# Patient Record
Sex: Male | Born: 1940 | Race: White | Hispanic: No | Marital: Married | Smoking: Former smoker
Health system: Southern US, Community
[De-identification: ages and names within clinical notes are randomized; demographics above are authoritative.]

## PROBLEM LIST (undated history)

## (undated) DIAGNOSIS — E785 Hyperlipidemia, unspecified: Secondary | ICD-10-CM

## (undated) DIAGNOSIS — I2510 Atherosclerotic heart disease of native coronary artery without angina pectoris: Secondary | ICD-10-CM

## (undated) DIAGNOSIS — K219 Gastro-esophageal reflux disease without esophagitis: Secondary | ICD-10-CM

## (undated) DIAGNOSIS — Z5189 Encounter for other specified aftercare: Secondary | ICD-10-CM

## (undated) DIAGNOSIS — I219 Acute myocardial infarction, unspecified: Secondary | ICD-10-CM

## (undated) DIAGNOSIS — F0390 Unspecified dementia without behavioral disturbance: Secondary | ICD-10-CM

## (undated) DIAGNOSIS — I10 Essential (primary) hypertension: Secondary | ICD-10-CM

## (undated) HISTORY — PX: CERVICAL SPINE SURGERY: SHX589

## (undated) HISTORY — PX: APPENDECTOMY: SHX54

## (undated) HISTORY — PX: CORONARY ARTERY BYPASS GRAFT: SHX141

## (undated) HISTORY — DX: Encounter for other specified aftercare: Z51.89

## (undated) HISTORY — DX: Gastro-esophageal reflux disease without esophagitis: K21.9

---

## 2000-08-27 ENCOUNTER — Encounter: Payer: Self-pay | Admitting: Cardiology

## 2000-08-27 ENCOUNTER — Inpatient Hospital Stay (HOSPITAL_COMMUNITY): Admission: EM | Admit: 2000-08-27 | Discharge: 2000-09-06 | Payer: Self-pay | Admitting: Emergency Medicine

## 2000-09-02 ENCOUNTER — Encounter: Payer: Self-pay | Admitting: Surgery

## 2000-09-03 ENCOUNTER — Encounter: Payer: Self-pay | Admitting: Surgery

## 2000-09-04 ENCOUNTER — Encounter: Payer: Self-pay | Admitting: Surgery

## 2004-01-20 ENCOUNTER — Inpatient Hospital Stay (HOSPITAL_COMMUNITY): Admission: EM | Admit: 2004-01-20 | Discharge: 2004-01-23 | Payer: Self-pay | Admitting: Internal Medicine

## 2013-01-14 ENCOUNTER — Inpatient Hospital Stay (HOSPITAL_COMMUNITY)
Admission: EM | Admit: 2013-01-14 | Discharge: 2013-01-17 | DRG: 300 | Disposition: A | Discharge status: Home / Self Care | Payer: Medicare Other | Attending: Internal Medicine | Admitting: Internal Medicine

## 2013-01-14 ENCOUNTER — Emergency Department (HOSPITAL_COMMUNITY): Payer: Medicare Other

## 2013-01-14 ENCOUNTER — Encounter (HOSPITAL_COMMUNITY): Payer: Self-pay

## 2013-01-14 DIAGNOSIS — I2510 Atherosclerotic heart disease of native coronary artery without angina pectoris: Secondary | ICD-10-CM

## 2013-01-14 DIAGNOSIS — R222 Localized swelling, mass and lump, trunk: Secondary | ICD-10-CM | POA: Diagnosis present

## 2013-01-14 DIAGNOSIS — D62 Acute posthemorrhagic anemia: Secondary | ICD-10-CM | POA: Diagnosis present

## 2013-01-14 DIAGNOSIS — Q2733 Arteriovenous malformation of digestive system vessel: Principal | ICD-10-CM

## 2013-01-14 DIAGNOSIS — Z87891 Personal history of nicotine dependence: Secondary | ICD-10-CM

## 2013-01-14 DIAGNOSIS — E785 Hyperlipidemia, unspecified: Secondary | ICD-10-CM | POA: Diagnosis present

## 2013-01-14 DIAGNOSIS — I209 Angina pectoris, unspecified: Secondary | ICD-10-CM

## 2013-01-14 DIAGNOSIS — R918 Other nonspecific abnormal finding of lung field: Secondary | ICD-10-CM | POA: Insufficient documentation

## 2013-01-14 DIAGNOSIS — R4182 Altered mental status, unspecified: Secondary | ICD-10-CM | POA: Diagnosis present

## 2013-01-14 DIAGNOSIS — K922 Gastrointestinal hemorrhage, unspecified: Secondary | ICD-10-CM

## 2013-01-14 DIAGNOSIS — I10 Essential (primary) hypertension: Secondary | ICD-10-CM | POA: Diagnosis present

## 2013-01-14 DIAGNOSIS — K2970 Gastritis, unspecified, without bleeding: Secondary | ICD-10-CM | POA: Diagnosis present

## 2013-01-14 DIAGNOSIS — D649 Anemia, unspecified: Secondary | ICD-10-CM

## 2013-01-14 DIAGNOSIS — K648 Other hemorrhoids: Secondary | ICD-10-CM | POA: Diagnosis present

## 2013-01-14 DIAGNOSIS — Z951 Presence of aortocoronary bypass graft: Secondary | ICD-10-CM

## 2013-01-14 DIAGNOSIS — I252 Old myocardial infarction: Secondary | ICD-10-CM

## 2013-01-14 DIAGNOSIS — D500 Iron deficiency anemia secondary to blood loss (chronic): Secondary | ICD-10-CM | POA: Diagnosis present

## 2013-01-14 DIAGNOSIS — K219 Gastro-esophageal reflux disease without esophagitis: Secondary | ICD-10-CM | POA: Diagnosis present

## 2013-01-14 DIAGNOSIS — R634 Abnormal weight loss: Secondary | ICD-10-CM | POA: Diagnosis present

## 2013-01-14 HISTORY — DX: Acute myocardial infarction, unspecified: I21.9

## 2013-01-14 HISTORY — DX: Atherosclerotic heart disease of native coronary artery without angina pectoris: I25.10

## 2013-01-14 HISTORY — DX: Essential (primary) hypertension: I10

## 2013-01-14 HISTORY — DX: Hyperlipidemia, unspecified: E78.5

## 2013-01-14 LAB — APTT: aPTT: 36 seconds (ref 24–37)

## 2013-01-14 LAB — PREPARE RBC (CROSSMATCH)

## 2013-01-14 LAB — CBC WITH DIFFERENTIAL/PLATELET
Eosinophils Absolute: 0.4 10*3/uL (ref 0.0–0.7)
Eosinophils Relative: 6 % — ABNORMAL HIGH (ref 0–5)
HCT: 19.2 % — ABNORMAL LOW (ref 39.0–52.0)
Lymphocytes Relative: 17 % (ref 12–46)
Lymphs Abs: 1.2 10*3/uL (ref 0.7–4.0)
MCH: 24 pg — ABNORMAL LOW (ref 26.0–34.0)
MCV: 79.3 fL (ref 78.0–100.0)
Monocytes Absolute: 0.7 10*3/uL (ref 0.1–1.0)
RBC: 2.42 MIL/uL — ABNORMAL LOW (ref 4.22–5.81)
RDW: 15.8 % — ABNORMAL HIGH (ref 11.5–15.5)
WBC: 7 10*3/uL (ref 4.0–10.5)

## 2013-01-14 LAB — TROPONIN I: Troponin I: <0.3 ng/mL (ref <0.30)

## 2013-01-14 LAB — URINALYSIS W MICROSCOPIC + REFLEX CULTURE
Glucose, UA: NEGATIVE mg/dL
Hgb urine dipstick: NEGATIVE
Ketones, ur: NEGATIVE mg/dL
Leukocytes, UA: NEGATIVE
Protein, ur: NEGATIVE mg/dL
pH: 5.5 (ref 5.0–8.0)

## 2013-01-14 LAB — BASIC METABOLIC PANEL
CO2: 25 mEq/L (ref 19–32)
Chloride: 102 mEq/L (ref 96–112)
Creatinine, Ser: 1.18 mg/dL (ref 0.50–1.35)
Glucose, Bld: 97 mg/dL (ref 70–99)

## 2013-01-14 MED ORDER — IOHEXOL 350 MG/ML SOLN
100.0000 mL | Freq: Once | INTRAVENOUS | Status: AC | PRN
  Administered 2013-01-14: 100 mL via INTRAVENOUS

## 2013-01-14 MED ORDER — ACETAMINOPHEN 650 MG RE SUPP: 650.0000 mg | Freq: Four times a day (QID) | RECTAL | Status: DC | PRN

## 2013-01-14 MED ORDER — PANTOPRAZOLE SODIUM 40 MG IV SOLR: 40.0000 mg | Freq: Two times a day (BID) | INTRAVENOUS | Status: DC

## 2013-01-14 MED ORDER — FUROSEMIDE 10 MG/ML IJ SOLN
20.0000 mg | Freq: Once | INTRAMUSCULAR | Status: AC
  Administered 2013-01-15: 20 mg via INTRAVENOUS
  Filled 2013-01-14: qty 2

## 2013-01-14 MED ORDER — ATORVASTATIN CALCIUM 20 MG PO TABS
20.0000 mg | ORAL_TABLET | Freq: Every evening | ORAL | Status: DC
  Administered 2013-01-15 – 2013-01-16 (×2): 20 mg via ORAL
  Filled 2013-01-14 (×2): qty 1

## 2013-01-14 MED ORDER — ASPIRIN 81 MG PO CHEW
324.0000 mg | CHEWABLE_TABLET | Freq: Once | ORAL | Status: AC
  Administered 2013-01-14: 324 mg via ORAL
  Filled 2013-01-14: qty 4

## 2013-01-14 MED ORDER — ONDANSETRON HCL 4 MG PO TABS: 4.0000 mg | ORAL_TABLET | Freq: Four times a day (QID) | ORAL | Status: DC | PRN

## 2013-01-14 MED ORDER — SODIUM CHLORIDE 0.9 % IV SOLN
INTRAVENOUS | Status: DC
  Administered 2013-01-15: 06:00:00 via INTRAVENOUS
  Administered 2013-01-16: 75 mL via INTRAVENOUS
  Administered 2013-01-16: 17:00:00 via INTRAVENOUS

## 2013-01-14 MED ORDER — SODIUM CHLORIDE 0.9 % IJ SOLN
3.0000 mL | Freq: Two times a day (BID) | INTRAMUSCULAR | Status: DC
  Administered 2013-01-14: 3 mL via INTRAVENOUS

## 2013-01-14 MED ORDER — ACETAMINOPHEN 325 MG PO TABS
650.0000 mg | ORAL_TABLET | Freq: Four times a day (QID) | ORAL | Status: DC | PRN
  Administered 2013-01-17: 650 mg via ORAL
  Filled 2013-01-14 (×2): qty 2

## 2013-01-14 MED ORDER — ONDANSETRON HCL 4 MG/2ML IJ SOLN: 4.0000 mg | Freq: Four times a day (QID) | INTRAMUSCULAR | Status: DC | PRN

## 2013-01-14 MED ORDER — PANTOPRAZOLE SODIUM 40 MG IV SOLR
40.0000 mg | Freq: Once | INTRAVENOUS | Status: AC
  Administered 2013-01-14: 40 mg via INTRAVENOUS
  Filled 2013-01-14: qty 40

## 2013-01-14 NOTE — ED Notes (Signed)
CRITICAL VALUE ALERT  Critical value received:  hgb 5.8  Date of notification:  01/14/13  Time of notification:  1839  Critical value read back: yes  Nurse who received alert:  Garald Braver  MD notified (1st page):  cook  Time of first page:  1831  MD notified (2nd page):  Time of second page:  Responding MD: cook  Time MD responded:

## 2013-01-14 NOTE — ED Provider Notes (Signed)
CSN: [id number]     Arrival date & time 01/14/13  1658 History     First MD Initiated Contact with Patient 01/14/13 1714     Chief Complaint  Patient presents with  . Chest Pain  . Shortness of Breath    HPI Pt was seen at 1725.  Per pt and family, c/o gradual onset and worsening of multiple intermittent episodes of chest "pain" for the past 1 to 2 weeks. Pt describes his symptoms as mid-sternal chest "pressure," associated with SOB, diaphoresis, and nausea. States the CP occasionally will radiate into his left shoulder. Symptoms occur with exertion (walking) and improve after resting (sitting) several minutes. Denies symptoms currently. Denies abd pain, no vomiting/diarrhea, no back pain, no cough, no palpitations, no syncope.    Past Medical History  Diagnosis Date  . MI (myocardial infarction)   . Hypertension   . Hyperlipidemia   . Coronary artery disease    Past Surgical History  Procedure Laterality Date  . Appendectomy    . Coronary artery bypass graft    . Cervical spine surgery      History  Substance Use Topics  . Smoking status: Former Games developer  . Smokeless tobacco: Not on file  . Alcohol Use: No    Review of Systems ROS: Statement: All systems negative except as marked or noted in the HPI; Constitutional: Negative for fever and chills. ; ; Eyes: Negative for eye pain, redness and discharge. ; ; ENMT: Negative for ear pain, hoarseness, nasal congestion, sinus pressure and sore throat. ; ; Cardiovascular: +CP, SOB, diaphoresis. Negative for palpitations, and peripheral edema. ; ; Respiratory: Negative for cough, wheezing and stridor. ; ; Gastrointestinal: +nausea. Negative for vomiting, diarrhea, abdominal pain, blood in stool, hematemesis, jaundice and rectal bleeding. . ; ; Genitourinary: Negative for dysuria, flank pain and hematuria. ; ; Musculoskeletal: Negative for back pain and neck pain. Negative for swelling and trauma.; ; Skin: Negative for pruritus, rash,  abrasions, blisters, bruising and skin lesion.; ; Neuro: Negative for headache, lightheadedness and neck stiffness. Negative for weakness, altered level of consciousness , altered mental status, extremity weakness, paresthesias, involuntary movement, seizure and syncope.     Allergies  Review of patient's allergies indicates no known allergies.  Home Medications   Current Outpatient Rx  Name  Route  Sig  Dispense  Refill  . atorvastatin (LIPITOR) 20 MG tablet   Oral   Take 20 mg by mouth every evening.         Marland Kitchen GARLIC PO   Oral   Take 1 tablet by mouth every evening.         . metoprolol tartrate (LOPRESSOR) 25 MG tablet   Oral   Take 25 mg by mouth every evening.         Marland Kitchen omeprazole (PRILOSEC) 20 MG capsule   Oral   Take 20 mg by mouth every evening.          BP 137/62  Pulse 72  Temp(Src) 98 F (36.7 C) (Oral)  Resp 21  Ht 5\' 7"  (1.702 m)  Wt 170 lb 5 oz (77.253 kg)  BMI 26.67 kg/m2  SpO2 98% Physical Exam 1730: Physical examination:  Nursing notes reviewed; Vital signs and O2 SAT reviewed;  Constitutional: Well developed, Well nourished, Well hydrated, In no acute distress; Head:  Normocephalic, atraumatic; Eyes: EOMI, PERRL, No scleral icterus; ENMT: Mouth and pharynx normal, Mucous membranes moist; Neck: Supple, Full range of motion, No lymphadenopathy; Cardiovascular: Regular rate and  rhythm, No gallop; Respiratory: Breath sounds clear & equal bilaterally, No wheezes.  Speaking full sentences with ease, Normal respiratory effort/excursion; Chest: Nontender, Movement normal; Abdomen: Soft, Nontender, Nondistended, Normal bowel sounds; Genitourinary: No CVA tenderness; Extremities: Pulses normal, No tenderness, No edema, No calf edema or asymmetry.; Neuro: AA&Ox3, Major CN grossly intact.  Speech clear. No gross focal motor or sensory deficits in extremities.; Skin: Color pale, Warm, Dry.   ED Course   1735:  Pt's symptoms concerning for angina. Pt states he  experienced symptoms "just walking in here" but they have currently resolved as he has rested on the ED stretcher. Will dose ASA now. EKG without acute ST elevations. Workup ordered.  1840:  CXR with possible mass. Hgb 5.8. Pt and family informed regarding same. Now reveals that he has had "black stools" for the past 1 to 2 weeks. Denies rectal bleeding, denies abd pain.  Abd continues benign on re-exam. Rectal exam performed w/permission of pt and ED RN chaperone present.  Anal tone normal.  Non-tender, soft brown stool in rectal vault, heme positive.  No fissures, no external hemorrhoids, no palp masses.  Will type and screen and check CT-A chest to r/o mass, PE.   1925:  T/C to [facility] Dr. Rito Ehrlich, case discussed, including:  HPI, pertinent PM/SHx, VS/PE, dx testing, ED course and treatment:  requests to obtain 2nd EKG and troponin and call him back regarding admission.  2100:  2nd troponin negative, EKG unchanged. Pt continues to deny CP/SOB while at rest. PRBC's transfusion ordered, started.  Dx and testing d/w pt and family.  Questions answered.  Verb understanding, agreeable to admit.  T/C to [facility] Dr. Rito Ehrlich, case discussed, including:  HPI, pertinent PM/SHx, VS/PE, dx testing, ED course and treatment:  Agreeable to come to ED for eval to admit at [facility] vs transfer to [facility].     Procedures    MDM  MDM Reviewed: previous chart, nursing note and vitals Reviewed previous: labs Interpretation: labs, ECG and x-ray Total time providing critical care: 30-74 minutes. This excludes time spent performing separately reportable procedures and services. Consults: admitting MD   CRITICAL CARE Performed by: Laray Anger Total critical care time: 35 Critical care time was exclusive of separately billable procedures and treating other patients. Critical care was necessary to treat or prevent imminent or life-threatening deterioration. Critical care was time spent personally by me on the following  activities: development of treatment plan with patient and/or surrogate as well as nursing, discussions with consultants, evaluation of patient's response to treatment, examination of patient, obtaining history from patient or surrogate, ordering and performing treatments and interventions, ordering and review of laboratory studies, ordering and review of radiographic studies, pulse oximetry and re-evaluation of patient's condition.    Date: 01/14/2013 1st EKG on arrival to ED  Rate: 75  Rhythm: normal sinus rhythm  QRS Axis: normal  Intervals: normal  ST/T Wave abnormalities: nonspecific ST/T changes  Conduction Disutrbances:none  Narrative Interpretation:   Old EKG Reviewed: none available.   Date: 01/14/2013 2nd EKG at Hospitalist request before admission  Rate: 74  Rhythm: normal sinus rhythm  QRS Axis: normal  Intervals: normal  ST/T Wave abnormalities: nonspecific ST/T changes  Conduction Disutrbances:none  Narrative Interpretation:   Old EKG Reviewed: unchanged from previous EKG obtained today.   Results for orders placed during the hospital encounter of 01/14/13  URINALYSIS W MICROSCOPIC + REFLEX CULTURE      Result Value Range   Color, Urine YELLOW  YELLOW  APPearance CLEAR  CLEAR   Specific Gravity, Urine 1.015  1.005 - 1.030   pH 5.5  5.0 - 8.0   Glucose, UA NEGATIVE  NEGATIVE mg/dL   Hgb urine dipstick NEGATIVE  NEGATIVE   Bilirubin Urine NEGATIVE  NEGATIVE   Ketones, ur NEGATIVE  NEGATIVE mg/dL   Protein, ur NEGATIVE  NEGATIVE mg/dL   Urobilinogen, UA 0.2  0.0 - 1.0 mg/dL   Nitrite NEGATIVE  NEGATIVE   Leukocytes, UA NEGATIVE  NEGATIVE  BASIC METABOLIC PANEL      Result Value Range   Sodium 138  135 - 145 mEq/L   Potassium 3.7  3.5 - 5.1 mEq/L   Chloride 102  96 - 112 mEq/L   CO2 25  19 - 32 mEq/L   Glucose, Bld 97  70 - 99 mg/dL   BUN 10  6 - 23 mg/dL   Creatinine, Ser 4.09  0.50 - 1.35 mg/dL   Calcium 9.3  8.4 - 81.1 mg/dL   GFR calc non Af Amer 59 (*)  >90 mL/min   GFR calc Af Amer 69 (*) >90 mL/min  CBC WITH DIFFERENTIAL      Result Value Range   WBC 7.0  4.0 - 10.5 K/uL   RBC 2.42 (*) 4.22 - 5.81 MIL/uL   Hemoglobin 5.8 (*) 13.0 - 17.0 g/dL   HCT 91.4 (*) 78.2 - 95.6 %   MCV 79.3  78.0 - 100.0 fL   MCH 24.0 (*) 26.0 - 34.0 pg   MCHC 30.2  30.0 - 36.0 g/dL   RDW 21.3 (*) 08.6 - 57.8 %   Platelets 307  150 - 400 K/uL   Neutrophils Relative % 66  43 - 77 %   Neutro Abs 4.6  1.7 - 7.7 K/uL   Lymphocytes Relative 17  12 - 46 %   Lymphs Abs 1.2  0.7 - 4.0 K/uL   Monocytes Relative 10  3 - 12 %   Monocytes Absolute 0.7  0.1 - 1.0 K/uL   Eosinophils Relative 6 (*) 0 - 5 %   Eosinophils Absolute 0.4  0.0 - 0.7 K/uL   Basophils Relative 1  0 - 1 %   Basophils Absolute 0.1  0.0 - 0.1 K/uL  TROPONIN I      Result Value Range   Troponin I <0.30  <0.30 ng/mL  PRO B NATRIURETIC PEPTIDE      Result Value Range   Pro B Natriuretic peptide (BNP) 634.6 (*) 0 - 125 pg/mL  TROPONIN I      Result Value Range   Troponin I <0.30  <0.30 ng/mL  TYPE AND SCREEN      Result Value Range   ABO/RH(D) O POS     Antibody Screen NEG     Sample Expiration 01/17/2013     Unit Number [id number]     Blood Component Type RED CELLS,LR     Unit division 00     Status of Unit ALLOCATED     Transfusion Status OK TO TRANSFUSE     Crossmatch Result Compatible     Unit Number [id number]     Blood Component Type RED CELLS,LR     Unit division 00     Status of Unit ALLOCATED     Transfusion Status OK TO TRANSFUSE     Crossmatch Result Compatible    PREPARE RBC (CROSSMATCH)      Result Value Range   Order Confirmation ORDER PROCESSED BY BLOOD BANK  ABO/RH      Result Value Range   ABO/RH(D) O POS     Dg Chest Port 1 View 01/14/2013   *RADIOLOGY REPORT*  Clinical Data: 72 year old male with chest pain, bilateral lower extremity pain.  PORTABLE CHEST - 1 VIEW  Comparison: [facility] chest radiograph 01/20/2004.  Findings: Portable AP  upright view of the chest at 1741 hours. New contour abnormality in the right paratracheal station with a 47 mm area of mass-like opacity inseparable from the superior right mediastinal contour. More subtle rounded increased right hilar contour abnormality compared to the prior study.  Elsewhere mediastinal contours are stable.  Stable sequelae of CABG.  Partially visible ACDF hardware.  No pneumothorax or pulmonary edema.  No pleural effusion or other confluent pulmonary opacity.  IMPRESSION: New mass-like opacity compatible with right suprahilar / paratracheal soft tissue mass.  Top differential consideration is bronchogenic carcinoma.  Recommend follow-up chest CT (IV contrast preferred).   Original Report Authenticated By: Erskine Speed, M.D.   Ct Angio Chest Pe W/cm &/or Wo Cm 01/14/2013   *RADIOLOGY REPORT*  Clinical Data: Abnormal chest x-ray  CT ANGIOGRAPHY CHEST  Technique:  Multidetector CT imaging of the chest using the standard protocol during bolus administration of intravenous contrast. Multiplanar reconstructed images including MIPs were obtained and reviewed to evaluate the vascular anatomy.  Contrast: OMNIPAQUE IOHEXOL 350 MG/ML SOLN  Comparison: Chest x-ray same day  Findings: Sagittal images of the spine shows degenerative changes thoracic spine.  The patient is status post CABG.  Atherosclerotic calcifications of the thoracic aorta and coronary arteries.  Heart size is within normal limits.  As noted on chest x-ray there is irregular  elongated mass arising from right suprahilar region just above the right pulmonary artery extending in the right upper lobe.  The lesion is best visualized in the coronal image 69 measures about 6.5 cm cranial caudally by 3.3 cm.  This is highly suspicious for a primary bronchogenic carcinoma.  There is encasement and some narrowing of a subsegmental arterial branch in the right upper lobe in axial image 28. Best visualized in the coronal image 70.  Further  evaluation with bronchoscopy/biopsy is recommended.  No significant mediastinal adenopathy.  No left hilar adenopathy.  No adrenal gland mass is noted in visualized upper abdomen.  No definite hepatic lesions are noted in visualized liver.  A precarinal lymph node measures 10 x 9 mm.  No acute infiltrate or pulmonary edema.  No pleural effusion.  IMPRESSION:  1.  There is a elongated irregular mass arising from the right suprahilar region extending in the right upper lobe with mild encasement of the subsegmental pulmonary artery arterial branch. The lesion measures at least 6.5 cm cranial caudally by 3.3 cm. This is highly suspicious for primary bronchogenic carcinoma. Further evaluation with bronchoscopy/biopsy is recommended. 2.  No significant mediastinal or left hilar adenopathy. 3.  No acute infiltrate or pulmonary edema.  No pleural effusion. 4.  Degenerative changes thoracic spine.   Original Report Authenticated By: Natasha Mead, M.D.              Laray Anger, DO 01/16/13 2200

## 2013-01-14 NOTE — ED Notes (Signed)
Pt reports cp that radiates to his left shoulder, dizziness, and sob for the past few weeks.  Pt reports " i got really tired and sob just walking into this building".

## 2013-01-14 NOTE — H&P (Signed)
[facility] History and Physical  Jonathan Watson UJW:[id number] DOB: 11/06/1940 DOA: 01/14/2013   PCP: Colon Branch, MD  Specialists: He used to be seen by a cardiologist in the past. However, he doesn't recall the name.  Chief Complaint: Weakness, dizziness  HPI: Jonathan Watson is a 72 y.o. male with a past medical history of coronary artery disease, hyperlipidemia, GERD, who appears have some kind of a cognitive impairment. His wife states that he has been having a lot of memory issues. She thinks that he may have early dementia. So history was limited from the patient. However, it appears, that the patient has been having dizziness and weakness for the last few days. He's been having shortness of breath with exertion, and had some chest pain earlier today. Denies any headaches. He had noticed black stools about a week ago. He had a few episodes of that according to the wife. He denies any abdominal pain currently. Denies any nausea, vomiting. No emesis of blood. No frank blood per rectum. No cough. No syncopal episodes. He does have a history of acid reflux and takes Prilosec. Denies taking any pain medicine, per se. Because of the dizziness, and tiredness they decided to come in to the hospital. Patient denies any chest pain currently. He denies any other discomfort at this time. He's never had endoscopy in the past. He also admits to weight loss of about 4 pounds in the last few weeks and about 20 pounds over the last one year. He had blood work by his primary care physician a few weeks ago, and everything according to the patient was normal. History is limited.  Home Medications: Prior to Admission medications   Medication Sig Start Date End Date Taking? Authorizing Provider  atorvastatin (LIPITOR) 20 MG tablet Take 20 mg by mouth every evening.   Yes Historical Provider, MD  GARLIC PO Take 1 tablet by mouth every evening.   Yes Historical Provider, MD  metoprolol tartrate  (LOPRESSOR) 25 MG tablet Take 25 mg by mouth every evening.   Yes Historical Provider, MD  omeprazole (PRILOSEC) 20 MG capsule Take 20 mg by mouth every evening.   Yes Historical Provider, MD    Allergies: No Known Allergies  Past Medical History: Past Medical History  Diagnosis Date  . MI (myocardial infarction)   . Hypertension   . Hyperlipidemia   . Coronary artery disease     Past Surgical History  Procedure Laterality Date  . Appendectomy    . Coronary artery bypass graft    . Cervical spine surgery      Social History:  reports that he has quit smoking. He does not have any smokeless tobacco history on file. He reports that he does not drink alcohol or use illicit drugs.  Living Situation: Lives in [place] with his wife Activity Level: Usually independent with daily activities   Family History: Patient denies any medical problems in his family  Review of Systems - History obtained from the patient General ROS: positive for  - fatigue Psychological ROS: negative Ophthalmic ROS: negative ENT ROS: negative Allergy and Immunology ROS: negative Hematological and Lymphatic ROS: negative Endocrine ROS: negative Respiratory ROS: as in hpi Cardiovascular ROS: as in hpi Gastrointestinal ROS: as in hpi Genito-Urinary ROS: no dysuria, trouble voiding, or hematuria Musculoskeletal ROS: negative Neurological ROS: no TIA or stroke symptoms Dermatological ROS: negative  Physical Examination  Filed Vitals:   01/14/13 1721 01/14/13 1800 01/14/13 1827 01/14/13 1900  BP: 128/71 145/61 145/61  137/62  Pulse: 74  65 72  Temp:      TempSrc:   Oral   Resp: 12 14 18 21   Height:      Weight:      SpO2:  98%      General appearance: alert, cooperative, appears stated age, distracted and no distress Head: Normocephalic, without obvious abnormality, atraumatic Eyes: pallor noted. No icterus. EOMI. Neck: no adenopathy, no carotid bruit, no JVD, supple, symmetrical, trachea  midline and thyroid not enlarged, symmetric, no tenderness/mass/nodules Back: symmetric, no curvature. ROM normal. No CVA tenderness. Resp: clear to auscultation bilaterally Cardio: regular rate and rhythm, S1, S2 normal, no murmur, click, rub or gallop GI: soft, non-tender; bowel sounds normal; no masses,  no organomegaly Extremities: extremities normal, atraumatic, no cyanosis or edema Pulses: 2+ and symmetric Skin: Skin color, texture, turgor normal. No rashes or lesions Lymph nodes: Cervical, supraclavicular, and axillary nodes normal. Neurologic: Alert, normal strength and tone. Normal symmetric reflexes. Normal coordination.  Laboratory Data: Results for orders placed during the hospital encounter of 01/14/13 (from the past 48 hour(s))  BASIC METABOLIC PANEL     Status: Abnormal   Collection Time    01/14/13  5:50 PM      Result Value Range   Sodium 138  135 - 145 mEq/L   Potassium 3.7  3.5 - 5.1 mEq/L   Chloride 102  96 - 112 mEq/L   CO2 25  19 - 32 mEq/L   Glucose, Bld 97  70 - 99 mg/dL   BUN 10  6 - 23 mg/dL   Creatinine, Ser 1.61  0.50 - 1.35 mg/dL   Calcium 9.3  8.4 - 09.6 mg/dL   GFR calc non Af Amer 59 (*) >90 mL/min   GFR calc Af Amer 69 (*) >90 mL/min   Comment: (NOTE)     The eGFR has been calculated using the CKD EPI equation.     This calculation has not been validated in all clinical situations.     eGFR's persistently <90 mL/min signify possible Chronic Kidney     Disease.  CBC WITH DIFFERENTIAL     Status: Abnormal   Collection Time    01/14/13  5:50 PM      Result Value Range   WBC 7.0  4.0 - 10.5 K/uL   RBC 2.42 (*) 4.22 - 5.81 MIL/uL   Hemoglobin 5.8 (*) 13.0 - 17.0 g/dL   Comment: RESULT REPEATED AND VERIFIED     CRITICAL RESULT CALLED TO, READ BACK BY AND VERIFIED WITH:      YOUNG,J @ 1827 ON 01/14/13 BY WOODIE,J   HCT 19.2 (*) 39.0 - 52.0 %   MCV 79.3  78.0 - 100.0 fL   MCH 24.0 (*) 26.0 - 34.0 pg   MCHC 30.2  30.0 - 36.0 g/dL   RDW 04.5 (*)  40.9 - 15.5 %   Platelets 307  150 - 400 K/uL   Neutrophils Relative % 66  43 - 77 %   Neutro Abs 4.6  1.7 - 7.7 K/uL   Lymphocytes Relative 17  12 - 46 %   Lymphs Abs 1.2  0.7 - 4.0 K/uL   Monocytes Relative 10  3 - 12 %   Monocytes Absolute 0.7  0.1 - 1.0 K/uL   Eosinophils Relative 6 (*) 0 - 5 %   Eosinophils Absolute 0.4  0.0 - 0.7 K/uL   Basophils Relative 1  0 - 1 %   Basophils Absolute 0.1  0.0 - 0.1 K/uL  TROPONIN I     Status: None   Collection Time    01/14/13  5:50 PM      Result Value Range   Troponin I <0.30  <0.30 ng/mL   Comment:            Due to the release kinetics of cTnI,     a negative result within the first hours     of the onset of symptoms does not rule out     myocardial infarction with certainty.     If myocardial infarction is still suspected,     repeat the test at appropriate intervals.  PRO B NATRIURETIC PEPTIDE     Status: Abnormal   Collection Time    01/14/13  5:50 PM      Result Value Range   Pro B Natriuretic peptide (BNP) 634.6 (*) 0 - 125 pg/mL  PREPARE RBC (CROSSMATCH)     Status: None   Collection Time    01/14/13  5:50 PM      Result Value Range   Order Confirmation ORDER PROCESSED BY BLOOD BANK    TYPE AND SCREEN     Status: None   Collection Time    01/14/13  6:54 PM      Result Value Range   ABO/RH(D) O POS     Antibody Screen NEG     Sample Expiration 01/17/2013     Unit Number [id number]     Blood Component Type RED CELLS,LR     Unit division 00     Status of Unit ALLOCATED     Transfusion Status OK TO TRANSFUSE     Crossmatch Result Compatible     Unit Number [id number]     Blood Component Type RED CELLS,LR     Unit division 00     Status of Unit ALLOCATED     Transfusion Status OK TO TRANSFUSE     Crossmatch Result Compatible    ABO/RH     Status: None   Collection Time    01/14/13  6:54 PM      Result Value Range   ABO/RH(D) O POS    URINALYSIS W MICROSCOPIC + REFLEX CULTURE     Status: None   Collection  Time    01/14/13  7:38 PM      Result Value Range   Color, Urine YELLOW  YELLOW   APPearance CLEAR  CLEAR   Specific Gravity, Urine 1.015  1.005 - 1.030   pH 5.5  5.0 - 8.0   Glucose, UA NEGATIVE  NEGATIVE mg/dL   Hgb urine dipstick NEGATIVE  NEGATIVE   Bilirubin Urine NEGATIVE  NEGATIVE   Ketones, ur NEGATIVE  NEGATIVE mg/dL   Protein, ur NEGATIVE  NEGATIVE mg/dL   Urobilinogen, UA 0.2  0.0 - 1.0 mg/dL   Nitrite NEGATIVE  NEGATIVE   Leukocytes, UA NEGATIVE  NEGATIVE  TROPONIN I     Status: None   Collection Time    01/14/13  8:07 PM      Result Value Range   Troponin I <0.30  <0.30 ng/mL   Comment:            Due to the release kinetics of cTnI,     a negative result within the first hours     of the onset of symptoms does not rule out     myocardial infarction with certainty.     If myocardial infarction is still suspected,  repeat the test at appropriate intervals.    Radiology Reports: Ct Angio Chest Pe W/cm &/or Wo Cm  01/14/2013   *RADIOLOGY REPORT*  Clinical Data: Abnormal chest x-ray  CT ANGIOGRAPHY CHEST  Technique:  Multidetector CT imaging of the chest using the standard protocol during bolus administration of intravenous contrast. Multiplanar reconstructed images including MIPs were obtained and reviewed to evaluate the vascular anatomy.  Contrast: OMNIPAQUE IOHEXOL 350 MG/ML SOLN  Comparison: Chest x-ray same day  Findings: Sagittal images of the spine shows degenerative changes thoracic spine.  The patient is status post CABG.  Atherosclerotic calcifications of the thoracic aorta and coronary arteries.  Heart size is within normal limits.  As noted on chest x-ray there is irregular  elongated mass arising from right suprahilar region just above the right pulmonary artery extending in the right upper lobe.  The lesion is best visualized in the coronal image 69 measures about 6.5 cm cranial caudally by 3.3 cm.  This is highly suspicious for a primary bronchogenic  carcinoma.  There is encasement and some narrowing of a subsegmental arterial branch in the right upper lobe in axial image 28. Best visualized in the coronal image 70.  Further evaluation with bronchoscopy/biopsy is recommended.  No significant mediastinal adenopathy.  No left hilar adenopathy.  No adrenal gland mass is noted in visualized upper abdomen.  No definite hepatic lesions are noted in visualized liver.  A precarinal lymph node measures 10 x 9 mm.  No acute infiltrate or pulmonary edema.  No pleural effusion.  IMPRESSION:  1.  There is a elongated irregular mass arising from the right suprahilar region extending in the right upper lobe with mild encasement of the subsegmental pulmonary artery arterial branch. The lesion measures at least 6.5 cm cranial caudally by 3.3 cm. This is highly suspicious for primary bronchogenic carcinoma. Further evaluation with bronchoscopy/biopsy is recommended. 2.  No significant mediastinal or left hilar adenopathy. 3.  No acute infiltrate or pulmonary edema.  No pleural effusion. 4.  Degenerative changes thoracic spine.   Original Report Authenticated By: Natasha Mead, M.D.   Dg Chest Port 1 View  01/14/2013   *RADIOLOGY REPORT*  Clinical Data: 72 year old male with chest pain, bilateral lower extremity pain.  PORTABLE CHEST - 1 VIEW  Comparison: [facility] chest radiograph 01/20/2004.  Findings: Portable AP upright view of the chest at 1741 hours. New contour abnormality in the right paratracheal station with a 47 mm area of mass-like opacity inseparable from the superior right mediastinal contour. More subtle rounded increased right hilar contour abnormality compared to the prior study.  Elsewhere mediastinal contours are stable.  Stable sequelae of CABG.  Partially visible ACDF hardware.  No pneumothorax or pulmonary edema.  No pleural effusion or other confluent pulmonary opacity.  IMPRESSION: New mass-like opacity compatible with right suprahilar /  paratracheal soft tissue mass.  Top differential consideration is bronchogenic carcinoma.  Recommend follow-up chest CT (IV contrast preferred).   Original Report Authenticated By: Erskine Speed, M.D.    Electrocardiogram: Initial EKG shows sinus rhythm at 75 beats per minute. Normal axis. Intervals are normal. Subtle ST depression noted in the lateral leads, as well as in lead 2. Subsequent EKG shows less pronounced ST depression.  Problem List  Principal Problem:   Upper GI bleed Active Problems:   Anemia   Lung mass   CAD (coronary artery disease)   GERD (gastroesophageal reflux disease)   Hyperlipidemia   Assessment: This is a 72 year old,  Caucasian male, who presents with dizziness, weakness, and is found to have severe anemia. He had heme positive brown stool, but reports black stools one week ago. He's also noted to have a right lung mass. His chest pain, and shortness of breath is probably related to his anemia.  Plan: #1 severe anemia: This is most likely causing his dyspnea and his angina symptoms. He'll be transfused 2 units to begin with. Posttransfusion CBC will be obtained. He'll be transfused to keep his hemoglobin greater than 8 at the very least.  #2 possible upper GI bleeding with history of melena: Give proton pump inhibitors. Will be kept n.p.o. GI will be consulted as he may need endoscopy in the morning. PT, INR will be checked, although he's not on any anticoagulants.  #3 right lung mass: This will need to be evaluated once the above acute issues have been stabilized. We will consult pulmonology. He will need bronchoscopy and tissue diagnosis.  #4 history of coronary artery disease, status post CABG: He had abnormal EKG. Troponins are reassuringly normal. This was most likely due to the anemia. We will transfuse him blood. Repeat EKG in the morning and continue to trend Troponin. Due to possibility of GI bleeding hold further antiplatelet agents. Monitor him closely on  telemetry. Cardiology input will be obtained if patient continues to have anginal symptoms.  DVT Prophylaxis: SCDs Code Status: Full code Family Communication: Discussed with the patient and his wife  Disposition Plan: Admit to step down   Further management decisions will depend on results of further testing and patient's response to treatment.  [facility]  [facility] Pager [phone]  If 7PM-7AM, please contact night-coverage [email or web address] Password [facility]  01/14/2013, 10:17 PM

## 2013-01-15 ENCOUNTER — Inpatient Hospital Stay (HOSPITAL_COMMUNITY): Payer: Medicare Other

## 2013-01-15 ENCOUNTER — Encounter (HOSPITAL_COMMUNITY): Payer: Self-pay | Admitting: *Deleted

## 2013-01-15 ENCOUNTER — Encounter (HOSPITAL_COMMUNITY)
Admission: EM | Disposition: A | Discharge status: Home / Self Care | Payer: Self-pay | Source: Home / Self Care | Attending: Internal Medicine

## 2013-01-15 DIAGNOSIS — K2990 Gastroduodenitis, unspecified, without bleeding: Secondary | ICD-10-CM

## 2013-01-15 DIAGNOSIS — K2970 Gastritis, unspecified, without bleeding: Secondary | ICD-10-CM

## 2013-01-15 DIAGNOSIS — R634 Abnormal weight loss: Secondary | ICD-10-CM

## 2013-01-15 DIAGNOSIS — K921 Melena: Secondary | ICD-10-CM

## 2013-01-15 DIAGNOSIS — K31819 Angiodysplasia of stomach and duodenum without bleeding: Secondary | ICD-10-CM

## 2013-01-15 HISTORY — PX: ESOPHAGOGASTRODUODENOSCOPY: SHX5428

## 2013-01-15 LAB — COMPREHENSIVE METABOLIC PANEL
ALT: 21 U/L (ref 0–53)
AST: 16 U/L (ref 0–37)
Albumin: 3.3 g/dL — ABNORMAL LOW (ref 3.5–5.2)
Alkaline Phosphatase: 86 U/L (ref 39–117)
BUN: 10 mg/dL (ref 6–23)
Chloride: 102 mEq/L (ref 96–112)
Potassium: 4 mEq/L (ref 3.5–5.1)
Sodium: 137 mEq/L (ref 135–145)
Total Bilirubin: 0.4 mg/dL (ref 0.3–1.2)
Total Protein: 6.9 g/dL (ref 6.0–8.3)

## 2013-01-15 LAB — CBC
MCH: 25.8 pg — ABNORMAL LOW (ref 26.0–34.0)
Platelets: 343 10*3/uL (ref 150–400)
RBC: 3.45 MIL/uL — ABNORMAL LOW (ref 4.22–5.81)

## 2013-01-15 LAB — MRSA PCR SCREENING: MRSA by PCR: NEGATIVE

## 2013-01-15 LAB — TROPONIN I: Troponin I: <0.3 ng/mL (ref <0.30)

## 2013-01-15 SURGERY — EGD (ESOPHAGOGASTRODUODENOSCOPY)
Anesthesia: Moderate Sedation

## 2013-01-15 MED ORDER — MEPERIDINE HCL 100 MG/ML IJ SOLN
INTRAMUSCULAR | Status: AC
  Filled 2013-01-15: qty 2

## 2013-01-15 MED ORDER — SODIUM CHLORIDE 0.9 % IV SOLN: INTRAVENOUS | Status: DC

## 2013-01-15 MED ORDER — MEPERIDINE HCL 100 MG/ML IJ SOLN
INTRAMUSCULAR | Status: DC | PRN
  Administered 2013-01-15 (×2): 25 mg via INTRAVENOUS

## 2013-01-15 MED ORDER — PEG 3350-KCL-NABCB-NACL-NASULF 236 G PO SOLR
4000.0000 mL | Freq: Once | ORAL | Status: AC
  Administered 2013-01-15: 4000 mL via ORAL
  Filled 2013-01-15: qty 4000

## 2013-01-15 MED ORDER — MIDAZOLAM HCL 5 MG/5ML IJ SOLN
INTRAMUSCULAR | Status: AC
  Filled 2013-01-15: qty 10

## 2013-01-15 MED ORDER — PANTOPRAZOLE SODIUM 40 MG PO TBEC
40.0000 mg | DELAYED_RELEASE_TABLET | Freq: Two times a day (BID) | ORAL | Status: DC
  Administered 2013-01-15 – 2013-01-16 (×3): 40 mg via ORAL
  Filled 2013-01-15 (×3): qty 1

## 2013-01-15 MED ORDER — STERILE WATER FOR IRRIGATION IR SOLN
Status: DC | PRN
  Administered 2013-01-15: 10:00:00

## 2013-01-15 MED ORDER — MIDAZOLAM HCL 5 MG/5ML IJ SOLN
INTRAMUSCULAR | Status: DC | PRN
  Administered 2013-01-15 (×3): 1 mg via INTRAVENOUS

## 2013-01-15 MED ORDER — BUTAMBEN-TETRACAINE-BENZOCAINE 2-2-14 % EX AERO
INHALATION_SPRAY | CUTANEOUS | Status: DC | PRN
  Administered 2013-01-15: 2 via TOPICAL

## 2013-01-15 MED ORDER — BISACODYL 5 MG PO TBEC
10.0000 mg | DELAYED_RELEASE_TABLET | ORAL | Status: AC
  Administered 2013-01-15 (×2): 10 mg via ORAL
  Filled 2013-01-15 (×2): qty 2

## 2013-01-15 NOTE — Progress Notes (Signed)
Gave bedside report to Zannie Kehr, RN. Patient agitated when getting back into bed. Still oriented to person and place. ICU monitoring of patient.

## 2013-01-15 NOTE — Consult Note (Signed)
Consult requested by: Dr. Karilyn Cota Consult requested for abnormal chest CT:  HPI: This is a 72 year old who came to the hospital because of bleeding. He seems somewhat confused to me this morning. As part of his evaluation he ended up having a CT scan which shows a mass lesion in his right lung. He has an extensive smoking history but stopped some years ago by his history and has had weight loss and is complaining of some right-sided chest pain. He has not had any hemoptysis.  Past Medical History  Diagnosis Date  . MI (myocardial infarction)   . Hypertension   . Hyperlipidemia   . Coronary artery disease      History reviewed. No pertinent family history.   History   Social History  . Marital Status: Married    Spouse Name: N/A    Number of Children: N/A  . Years of Education: N/A   Social History Main Topics  . Smoking status: Former Games developer  . Smokeless tobacco: None  . Alcohol Use: No  . Drug Use: No  . Sexual Activity: None   Other Topics Concern  . None   Social History Narrative  . None     ROS: Because of his confusion and not sure how accurate this is but he says he lost about 5 pounds this week. He has no other new complaints. He says he is breathing he is in trouble and he has episodes of "smothering".    Objective: Vital signs in last 24 hours: Temp:  [98 F (36.7 C)-98.7 F (37.1 C)] 98.5 F (36.9 C) (08/16 0540) Pulse Rate:  [64-85] 85 (08/16 0400) Resp:  [8-22] 10 (08/16 0600) BP: (110-162)/(39-96) 135/59 mmHg (08/16 0600) SpO2:  [98 %-99 %] 99 % (08/16 0400) Weight:  [75.3 kg (166 lb 0.1 oz)-77.253 kg (170 lb 5 oz)] 76.6 kg (168 lb 14 oz) (08/16 0400) Weight change:  Last BM Date: 01/15/13  Intake/Output from previous day: 08/15 0701 - 08/16 0700 In: 375 [Blood:375] Out: 1700 [Urine:1700]  PHYSICAL EXAM He is awake and alert and confused. His pupils are reactive nose and throat are clear mucous membranes are dry his neck is supple without  masses. I do not feel any lymphadenopathy in his neck or the supraclavicular area his chest is relatively clear with some rhonchi. His heart is regular without gallop. His abdomen is soft without masses. Extremities showed no edema. Central nervous system exam is grossly intact except for his confusion  Lab Results: Basic Metabolic Panel:  Recent Labs  16/10/96 1750 01/15/13 0217  NA 138 137  K 3.7 4.0  CL 102 102  CO2 25 25  GLUCOSE 97 104*  BUN 10 10  CREATININE 1.18 1.18  CALCIUM 9.3 9.4   Liver Function Tests:  Recent Labs  01/15/13 0217  AST 16  ALT 21  ALKPHOS 86  BILITOT 0.4  PROT 6.9  ALBUMIN 3.3*   No results found for this basename: LIPASE, AMYLASE,  in the last 72 hours No results found for this basename: AMMONIA,  in the last 72 hours CBC:  Recent Labs  01/14/13 1750 01/15/13 0704  WBC 7.0 8.0  NEUTROABS 4.6  --   HGB 5.8* 8.9*  HCT 19.2* 28.0*  MCV 79.3 81.2  PLT 307 343   Cardiac Enzymes:  Recent Labs  01/14/13 2007 01/15/13 0217 01/15/13 0704  TROPONINI <0.30 <0.30 <0.30   BNP:  Recent Labs  01/14/13 1750  PROBNP 634.6*   D-Dimer: No results  found for this basename: DDIMER,  in the last 72 hours CBG: No results found for this basename: GLUCAP,  in the last 72 hours Hemoglobin A1C: No results found for this basename: HGBA1C,  in the last 72 hours Fasting Lipid Panel: No results found for this basename: CHOL, HDL, LDLCALC, TRIG, CHOLHDL, LDLDIRECT,  in the last 72 hours Thyroid Function Tests: No results found for this basename: TSH, T4TOTAL, FREET4, T3FREE, THYROIDAB,  in the last 72 hours Anemia Panel: No results found for this basename: VITAMINB12, FOLATE, FERRITIN, TIBC, IRON, RETICCTPCT,  in the last 72 hours Coagulation:  Recent Labs  01/14/13 1750  LABPROT 14.6  INR 1.16   Urine Drug Screen: Drugs of Abuse  No results found for this basename: labopia, cocainscrnur, labbenz, amphetmu, thcu, labbarb    Alcohol  Level: No results found for this basename: ETH,  in the last 72 hours Urinalysis:  Recent Labs  01/14/13 1938  COLORURINE YELLOW  LABSPEC 1.015  PHURINE 5.5  GLUCOSEU NEGATIVE  HGBUR NEGATIVE  BILIRUBINUR NEGATIVE  KETONESUR NEGATIVE  PROTEINUR NEGATIVE  UROBILINOGEN 0.2  NITRITE NEGATIVE  LEUKOCYTESUR NEGATIVE   Misc. Labs:   ABGS: No results found for this basename: PHART, PCO2, PO2ART, TCO2, HCO3,  in the last 72 hours   MICROBIOLOGY: Recent Results (from the past 240 hour(s))  MRSA PCR SCREENING     Status: None   Collection Time    01/14/13 10:50 PM      Result Value Range Status   MRSA by PCR NEGATIVE  NEGATIVE Final   Comment:            The GeneXpert MRSA Assay (FDA     approved for NASAL specimens     only), is one component of a     comprehensive MRSA colonization     surveillance program. It is not     intended to diagnose MRSA     infection nor to guide or     monitor treatment for     MRSA infections.    Studies/Results: Ct Angio Chest Pe W/cm &/or Wo Cm  01/14/2013   *RADIOLOGY REPORT*  Clinical Data: Abnormal chest x-ray  CT ANGIOGRAPHY CHEST  Technique:  Multidetector CT imaging of the chest using the standard protocol during bolus administration of intravenous contrast. Multiplanar reconstructed images including MIPs were obtained and reviewed to evaluate the vascular anatomy.  Contrast: OMNIPAQUE IOHEXOL 350 MG/ML SOLN  Comparison: Chest x-ray same day  Findings: Sagittal images of the spine shows degenerative changes thoracic spine.  The patient is status post CABG.  Atherosclerotic calcifications of the thoracic aorta and coronary arteries.  Heart size is within normal limits.  As noted on chest x-ray there is irregular  elongated mass arising from right suprahilar region just above the right pulmonary artery extending in the right upper lobe.  The lesion is best visualized in the coronal image 69 measures about 6.5 cm cranial caudally by 3.3  cm.  This is highly suspicious for a primary bronchogenic carcinoma.  There is encasement and some narrowing of a subsegmental arterial branch in the right upper lobe in axial image 28. Best visualized in the coronal image 70.  Further evaluation with bronchoscopy/biopsy is recommended.  No significant mediastinal adenopathy.  No left hilar adenopathy.  No adrenal gland mass is noted in visualized upper abdomen.  No definite hepatic lesions are noted in visualized liver.  A precarinal lymph node measures 10 x 9 mm.  No acute infiltrate  or pulmonary edema.  No pleural effusion.  IMPRESSION:  1.  There is a elongated irregular mass arising from the right suprahilar region extending in the right upper lobe with mild encasement of the subsegmental pulmonary artery arterial branch. The lesion measures at least 6.5 cm cranial caudally by 3.3 cm. This is highly suspicious for primary bronchogenic carcinoma. Further evaluation with bronchoscopy/biopsy is recommended. 2.  No significant mediastinal or left hilar adenopathy. 3.  No acute infiltrate or pulmonary edema.  No pleural effusion. 4.  Degenerative changes thoracic spine.   Original Report Authenticated By: Natasha Mead, M.D.   Dg Chest Port 1 View  01/14/2013   *RADIOLOGY REPORT*  Clinical Data: 72 year old male with chest pain, bilateral lower extremity pain.  PORTABLE CHEST - 1 VIEW  Comparison: [facility] chest radiograph 01/20/2004.  Findings: Portable AP upright view of the chest at 1741 hours. New contour abnormality in the right paratracheal station with a 47 mm area of mass-like opacity inseparable from the superior right mediastinal contour. More subtle rounded increased right hilar contour abnormality compared to the prior study.  Elsewhere mediastinal contours are stable.  Stable sequelae of CABG.  Partially visible ACDF hardware.  No pneumothorax or pulmonary edema.  No pleural effusion or other confluent pulmonary opacity.  IMPRESSION:  New mass-like opacity compatible with right suprahilar / paratracheal soft tissue mass.  Top differential consideration is bronchogenic carcinoma.  Recommend follow-up chest CT (IV contrast preferred).   Original Report Authenticated By: Erskine Speed, M.D.    Medications:  Scheduled: . atorvastatin  20 mg Oral QPM  . meperidine      . midazolam      . pantoprazole (PROTONIX) IV  40 mg Intravenous Q12H  . sodium chloride  3 mL Intravenous Q12H   Continuous: . sodium chloride 50 mL/hr at 01/15/13 0603   ZOX:[id number], acetaminophen, ondansetron (ZOFRAN) IV, ondansetron  Assesment: He was admitted with upper GI bleeding and anemia from that. He is receiving blood and he is better. He has a lung mass. He has a history of coronary artery occlusive disease  Principal Problem:   Upper GI bleed Active Problems:   Anemia   Lung mass   CAD (coronary artery disease)   GERD (gastroesophageal reflux disease)   Hyperlipidemia    Plan: He will need likely bronchoscopy. I will review the CT and see if this looks like it will be amenable to bronchoscopy here or whether he will need to have this done in [facility]    LOS: 1 day   Keanen Dohse L 01/15/2013, 9:11 AM

## 2013-01-15 NOTE — Op Note (Signed)
[facility] [address] [place], [address]   ENDOSCOPY PROCEDURE REPORT  PATIENT: Jonathan Watson, Jonathan Watson  MR#: [id number] BIRTHDATE: 1941/04/10 , 72  yrs. old GENDER: Male  ENDOSCOPIST: Jonette Eva, MD REFERRED VH:QIONG Qureshi, M.D.  PROCEDURE DATE: 01/15/2013 PROCEDURE:   EGD w/ biopsy and EGD w/ ablation  INDICATIONS:Melena. WEIGHT LOSS. MENTAL STATUS CHANGES. LUNG MASS ON CT CHEST. MEDICATIONS: Demerol 50 mg IV and Versed 3 mg IV TOPICAL ANESTHETIC:   Cetacaine Spray  DESCRIPTION OF PROCEDURE:     Physical exam was performed.  Informed consent was obtained from the patient after explaining the benefits, risks, and alternatives to the procedure.  The patient was connected to the monitor and placed in the left lateral position.  Continuous oxygen was provided by nasal cannula and IV medicine administered through an indwelling cannula.  After administration of sedation, the patients esophagus was intubated and the EG-[id number] ([id number])  endoscope was advanced under direct visualization to the second portion of the duodenum.  The scope was removed slowly by carefully examining the color, texture, anatomy, and integrity of the mucosa on the way out.  The patient was recovered in endoscopy and discharged home in satisfactory condition.   ESOPHAGUS:  The mucosa of the esophagus appeared normal.   A small hiatal hernia was noted.   STOMACH: ONE AVM was  found in the gastric antrum & ABLATED WITH BICAP(25W)   Mild non-erosive gastritis (inflammation) was found in the gastric body.  Multiple biopsies were performed using cold forceps.   DUODENUM: The duodenal mucosa showed no abnormalities in the bulb and second portion of the duodenum.  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

## 2013-01-15 NOTE — Progress Notes (Signed)
1700 Discussed with Melissa of Radiology the fact that it would not be effective to give the oral contrast and the [facility] order at the same time due to the patient would loose the contrast with the his stool.  I asked if I could change the order for the CAT scans until after the procedure.  She stated it would be okay.  I then called Melissa back and updated her she verbalized understanding and voiced to me to throw away the contrast brought to the floor.

## 2013-01-15 NOTE — Consult Note (Addendum)
Referring Provider: No ref. provider found Primary Care Physician:  QURESHI, AYYAZ, MD Primary Gastroenterologist:  Sandi Fields  Reason for Consultation:  MELENA/ANEMIA  HPI:  PT ADMITTED WITH MENTAL STATUS CHANGES, SOB, CHEST PAIN, AND ANEMIA. REPORTS BLACK STOOLS. HEMOGLOBIN LOW. PT CAN'T REMEMBER HIS BIRTHDAY. DURING WORKUP CT SHOWS LARGE RIGHT LUNG MASS. CAN'T REMEMBER WHEN HE HAD A TCS. NO ASPIRIN, BC, GOODYS, IBUPROFEN/MORTIN, OR NAPROXEN/ALEVE. DENIED ETOH. PT DENIES FEVER, CHILLS, BRBPR, nausea, vomiting, diarrhea, constipation, abd pain, problems swallowing, OR heartburn or indigestion. WIFE STATES PT HAS NEVER HAD A TCS & HAS LOST WEIGHT(> 10 LBS). SX BEGAN ONE MO AGO.  Past Medical History  Diagnosis Date  . MI (myocardial infarction)   . Hypertension   . Hyperlipidemia   . Coronary artery disease    Past Surgical History  Procedure Laterality Date  . Appendectomy    . Coronary artery bypass graft    . Cervical spine surgery    NO ADDITIONAL ABDOMINAL SURGERIES  Prior to Admission medications   Medication Sig Start Date End Date Taking? Authorizing Provider  atorvastatin (LIPITOR) 20 MG tablet Take 20 mg by mouth every evening.   Yes Historical Provider, MD  GARLIC PO Take 1 tablet by mouth every evening.   Yes Historical Provider, MD  metoprolol tartrate (LOPRESSOR) 25 MG tablet Take 25 mg by mouth every evening.   Yes Historical Provider, MD  omeprazole (PRILOSEC) 20 MG capsule Take 20 mg by mouth every evening.   Yes Historical Provider, MD    Current Facility-Administered Medications  Medication Dose Route Frequency Provider Last Rate Last Dose  . 0.9 %  sodium chloride infusion   Intravenous Continuous Gokul Krishnan, MD 50 mL/hr at 01/15/13 0603    . acetaminophen (TYLENOL) tablet 650 mg  650 mg Oral Q6H PRN Gokul Krishnan, MD       Or  . acetaminophen (TYLENOL) suppository 650 mg  650 mg Rectal Q6H PRN Gokul Krishnan, MD      . atorvastatin (LIPITOR) tablet  20 mg  20 mg Oral QPM Gokul Krishnan, MD      . ondansetron (ZOFRAN) tablet 4 mg  4 mg Oral Q6H PRN Gokul Krishnan, MD       Or  . ondansetron (ZOFRAN) injection 4 mg  4 mg Intravenous Q6H PRN Gokul Krishnan, MD      . pantoprazole (PROTONIX) injection 40 mg  40 mg Intravenous Q12H Gokul Krishnan, MD      . sodium chloride 0.9 % injection 3 mL  3 mL Intravenous Q12H Gokul Krishnan, MD   3 mL at 01/14/13 2323    Allergies as of 01/14/2013  . (No Known Allergies)    Family History:  Colon Cancer  Positive: ? ON HIS DAD SIDE OF THE FAMILY                           Polyps  negative   History   Social History  . Marital Status: Married    Spouse Name: N/A    Number of Children: N/A  . Years of Education: N/A   Social History Main Topics  . Smoking status: Former Smoker  . Smokeless tobacco: Not on file  . Alcohol Use: No  . Drug Use: No     Review of Systems: .LIMITED DUE TO PT COGNITIVE DYSFUNCTION  Vitals: Blood pressure 123/72, pulse 85, temperature 98.5 F (36.9 C), temperature source Oral, resp. rate 12, height 5' 7" (1.702   m), weight 168 lb 14 oz (76.6 kg), SpO2 99.00%.  Physical Exam: General:   Alert,  and cooperative in NAD Head:  Normocephalic and atraumatic. Eyes:  Sclera clear, no icterus.   Conjunctiva pink. Mouth:  No deformity or lesions, dentition ABnormal. Neck:  Supple; no masses. Lungs:  Clear throughout to auscultation.   No wheezes. No acute distress. Heart:  Regular rate and rhythm; no murmurs, clicks, rubs,  or gallops. Abdomen:  Soft, nontender and nondistended. No masses noted. Normal bowel sounds, without guarding, and without rebound.   Msk:  Symmetrical without gross deformities. Normal posture. Extremities:  Without edema. BIL SCDs IN PLACE Neurologic:  Alert and INTERACTIVE;  NO  NEW FOCAL DEFICITS Cervical Nodes:  No significant cervical adenopathy. Psych:  Alert and cooperative. Normal mood and FLAT affect.   Lab Results:  Recent  Labs  01/14/13 1750 01/15/13 0704  WBC 7.0 8.0  HGB 5.8* 8.9*  HCT 19.2* 28.0*  PLT 307 343   BMET  Recent Labs  01/14/13 1750 01/15/13 0217  NA 138 137  K 3.7 4.0  CL 102 102  CO2 25 25  GLUCOSE 97 104*  BUN 10 10  CREATININE 1.18 1.18  CALCIUM 9.3 9.4   LFT  Recent Labs  01/15/13 0217  PROT 6.9  ALBUMIN 3.3*  AST 16  ALT 21  ALKPHOS 86  BILITOT 0.4     Studies/Results: CT CHEST: RUL LOBE MASS-NO INVOLVEMENT OF THE MEDIASTINUM. I REVIEWED FILM WITH DR. STAHL.  Impression: MELENA-ETIOLOGY UNCLEAR DIFFERENTIAL DIAGNOSIS DIEULAFOY'S, PUD, AVM, AND LESS LIKELY GASTRIC OR ESOPHAGEAL CA.  Plan: 1. DAILY PPI 2. EGD TODAY. CONSIDER TCS +/- CAPSULE ENDOSCOPY IF NO SOURCE FOR MELENA FOUND 3. NPO EXCEPT MEDS/ICE CHIPS   LOS: 1 day   Sandi Fields  01/15/2013, 8:05 AM    

## 2013-01-15 NOTE — H&P (View-Only) (Signed)
Referring Provider: No ref. provider found Primary Care Physician:  Colon Branch, MD Primary Gastroenterologist:  Jonette Eva  Reason for Consultation:  MELENA/ANEMIA  HPI:  PT ADMITTED WITH MENTAL STATUS CHANGES, SOB, CHEST PAIN, AND ANEMIA. REPORTS BLACK STOOLS. HEMOGLOBIN LOW. PT CAN'T REMEMBER HIS BIRTHDAY. DURING WORKUP CT SHOWS LARGE RIGHT LUNG MASS. CAN'T REMEMBER WHEN HE HAD A TCS. NO ASPIRIN, BC, GOODYS, IBUPROFEN/MORTIN, OR NAPROXEN/ALEVE. DENIED ETOH. PT DENIES FEVER, CHILLS, BRBPR, nausea, vomiting, diarrhea, constipation, abd pain, problems swallowing, OR heartburn or indigestion.  Past Medical History  Diagnosis Date  . MI (myocardial infarction)   . Hypertension   . Hyperlipidemia   . Coronary artery disease    Past Surgical History  Procedure Laterality Date  . Appendectomy    . Coronary artery bypass graft    . Cervical spine surgery    NO ADDITIONAL ABDOMINAL SURGERIES  Prior to Admission medications   Medication Sig Start Date End Date Taking? Authorizing Provider  atorvastatin (LIPITOR) 20 MG tablet Take 20 mg by mouth every evening.   Yes Historical Provider, MD  GARLIC PO Take 1 tablet by mouth every evening.   Yes Historical Provider, MD  metoprolol tartrate (LOPRESSOR) 25 MG tablet Take 25 mg by mouth every evening.   Yes Historical Provider, MD  omeprazole (PRILOSEC) 20 MG capsule Take 20 mg by mouth every evening.   Yes Historical Provider, MD    Current Facility-Administered Medications  Medication Dose Route Frequency Provider Last Rate Last Dose  . 0.9 %  sodium chloride infusion   Intravenous Continuous Osvaldo Shipper, MD 50 mL/hr at 01/15/13 0603    . acetaminophen (TYLENOL) tablet 650 mg  650 mg Oral Q6H PRN Osvaldo Shipper, MD       Or  . acetaminophen (TYLENOL) suppository 650 mg  650 mg Rectal Q6H PRN Osvaldo Shipper, MD      . atorvastatin (LIPITOR) tablet 20 mg  20 mg Oral QPM Osvaldo Shipper, MD      . ondansetron (ZOFRAN) tablet 4 mg  4  mg Oral Q6H PRN Osvaldo Shipper, MD       Or  . ondansetron (ZOFRAN) injection 4 mg  4 mg Intravenous Q6H PRN Osvaldo Shipper, MD      . pantoprazole (PROTONIX) injection 40 mg  40 mg Intravenous Q12H Osvaldo Shipper, MD      . sodium chloride 0.9 % injection 3 mL  3 mL Intravenous Q12H Osvaldo Shipper, MD   3 mL at 01/14/13 2323    Allergies as of 01/14/2013  . (No Known Allergies)    Family History:  Colon Cancer  Positive: ? ON HIS DAD SIDE OF THE FAMILY                           Polyps  negative   History   Social History  . Marital Status: Married    Spouse Name: N/A    Number of Children: N/A  . Years of Education: N/A   Social History Main Topics  . Smoking status: Former Games developer  . Smokeless tobacco: Not on file  . Alcohol Use: No  . Drug Use: No     Review of Systems: .LIMITED DUE TO PT COGNITIVE DYSFUNCTION  Vitals: Blood pressure 123/72, pulse 85, temperature 98.5 F (36.9 C), temperature source Oral, resp. rate 12, height 5\' 7"  (1.702 m), weight 168 lb 14 oz (76.6 kg), SpO2 99.00%.  Physical Exam: General:   Alert,  and  cooperative in NAD Head:  Normocephalic and atraumatic. Eyes:  Sclera clear, no icterus.   Conjunctiva pink. Mouth:  No deformity or lesions, dentition ABnormal. Neck:  Supple; no masses. Lungs:  Clear throughout to auscultation.   No wheezes. No acute distress. Heart:  Regular rate and rhythm; no murmurs, clicks, rubs,  or gallops. Abdomen:  Soft, nontender and nondistended. No masses noted. Normal bowel sounds, without guarding, and without rebound.   Msk:  Symmetrical without gross deformities. Normal posture. Extremities:  Without edema. BIL SCDs IN PLACE Neurologic:  Alert and INTERACTIVE;  NO  NEW FOCAL DEFICITS Cervical Nodes:  No significant cervical adenopathy. Psych:  Alert and cooperative. Normal mood and FLAT affect.   Lab Results:  Recent Labs  01/14/13 1750 01/15/13 0704  WBC 7.0 8.0  HGB 5.8* 8.9*  HCT 19.2* 28.0*   PLT 307 343   BMET  Recent Labs  01/14/13 1750 01/15/13 0217  NA 138 137  K 3.7 4.0  CL 102 102  CO2 25 25  GLUCOSE 97 104*  BUN 10 10  CREATININE 1.18 1.18  CALCIUM 9.3 9.4   LFT  Recent Labs  01/15/13 0217  PROT 6.9  ALBUMIN 3.3*  AST 16  ALT 21  ALKPHOS 86  BILITOT 0.4     Studies/Results: CT CHEST: RUL LOBE MASS-NO INVOLVEMENT OF THE MEDIASTINUM. I REVIEWED FILM WITH DR. Eppie Gibson.  Impression: MELENA-ETIOLOGY UNCLEAR DIFFERENTIAL DIAGNOSIS DIEULAFOY'S, PUD, AVM, AND LESS LIKELY GASTRIC OR ESOPHAGEAL CA.  Plan: 1. DAILY PPI 2. EGD TODAY. CONSIDER CAPSULE ENDOSCOPY IF MNO SOURCE FOR MELENA FOUND 3. NPO EXCEPT MEDS/ICE CHIPS   LOS: 1 day   [facility]  01/15/2013, 8:05 AM

## 2013-01-15 NOTE — Progress Notes (Signed)
Dr. Darrick Penna notified that the patient is confused and may not be able to complete his Colonoscopy prep by tomorrow.  I will continue to encourage him to take his liquid prep, and see if he will allow the tap water enema.  He is combative at times due to his confusion possibly.  She verbalizes understanding and states that it may possibly take 2 days to administer the patients prep.

## 2013-01-15 NOTE — Interval H&P Note (Signed)
History and Physical Interval Note:  01/15/2013 9:54 AM  Jonathan Watson  has presented today for surgery, with the diagnosis of melena, anemia  The various methods of treatment have been discussed with the patient and family. After consideration of risks, benefits and other options for treatment, the patient has consented to  Procedure(s): ESOPHAGOGASTRODUODENOSCOPY (EGD) (N/A) as a surgical intervention .  The patient's history has been reviewed, patient examined, no change in status, stable for surgery.  I have reviewed the patient's chart and labs.  Questions were answered to the patient's satisfaction.     [employer]

## 2013-01-15 NOTE — Progress Notes (Signed)
PAT SIRES UJW:[id number] DOB: 1941/04/14 DOA: 01/14/2013 PCP: Colon Branch, MD   Subjective: This man feels much improved after receiving 2 units of blood. He has not had any hematemesis. He has no specific complaints. He says he quit smoking several years ago. He denies a history of alcohol abuse but according to the nursing staff, he does have a history of alcohol abuse.           Physical Exam: Blood pressure 123/72, pulse 85, temperature 98.5 F (36.9 C), temperature source Oral, resp. rate 12, height 5\' 7"  (1.702 m), weight 76.6 kg (168 lb 14 oz), SpO2 99.00%. He looks systemically well. He is no longer pale. He seems to be alert and orientated. Heart sounds are present in sinus rhythm without murmurs or added sounds. Lung fields are clear. Abdomen is soft and nontender. There are no focal neurological signs. There is no supraclavicular or neck lymphadenopathy. There is no clubbing.   Investigations:  Recent Results (from the past 240 hour(s))  MRSA PCR SCREENING     Status: None   Collection Time    01/14/13 10:50 PM      Result Value Range Status   MRSA by PCR NEGATIVE  NEGATIVE Final   Comment:            The GeneXpert MRSA Assay (FDA     approved for NASAL specimens     only), is one component of a     comprehensive MRSA colonization     surveillance program. It is not     intended to diagnose MRSA     infection nor to guide or     monitor treatment for     MRSA infections.     Basic Metabolic Panel:  Recent Labs  56/21/30 1750 01/15/13 0217  NA 138 137  K 3.7 4.0  CL 102 102  CO2 25 25  GLUCOSE 97 104*  BUN 10 10  CREATININE 1.18 1.18  CALCIUM 9.3 9.4   Liver Function Tests:  Recent Labs  01/15/13 0217  AST 16  ALT 21  ALKPHOS 86  BILITOT 0.4  PROT 6.9  ALBUMIN 3.3*     CBC:  Recent Labs  01/14/13 1750 01/15/13 0704  WBC 7.0 8.0  NEUTROABS 4.6  --   HGB 5.8* 8.9*  HCT 19.2* 28.0*  MCV 79.3 81.2  PLT 307 343    Ct  Angio Chest Pe W/cm &/or Wo Cm  01/14/2013   *RADIOLOGY REPORT*  Clinical Data: Abnormal chest x-ray  CT ANGIOGRAPHY CHEST  Technique:  Multidetector CT imaging of the chest using the standard protocol during bolus administration of intravenous contrast. Multiplanar reconstructed images including MIPs were obtained and reviewed to evaluate the vascular anatomy.  Contrast: OMNIPAQUE IOHEXOL 350 MG/ML SOLN  Comparison: Chest x-ray same day  Findings: Sagittal images of the spine shows degenerative changes thoracic spine.  The patient is status post CABG.  Atherosclerotic calcifications of the thoracic aorta and coronary arteries.  Heart size is within normal limits.  As noted on chest x-ray there is irregular  elongated mass arising from right suprahilar region just above the right pulmonary artery extending in the right upper lobe.  The lesion is best visualized in the coronal image 69 measures about 6.5 cm cranial caudally by 3.3 cm.  This is highly suspicious for a primary bronchogenic carcinoma.  There is encasement and some narrowing of a subsegmental arterial branch in the right upper lobe in axial image 28.  Best visualized in the coronal image 70.  Further evaluation with bronchoscopy/biopsy is recommended.  No significant mediastinal adenopathy.  No left hilar adenopathy.  No adrenal gland mass is noted in visualized upper abdomen.  No definite hepatic lesions are noted in visualized liver.  A precarinal lymph node measures 10 x 9 mm.  No acute infiltrate or pulmonary edema.  No pleural effusion.  IMPRESSION:  1.  There is a elongated irregular mass arising from the right suprahilar region extending in the right upper lobe with mild encasement of the subsegmental pulmonary artery arterial branch. The lesion measures at least 6.5 cm cranial caudally by 3.3 cm. This is highly suspicious for primary bronchogenic carcinoma. Further evaluation with bronchoscopy/biopsy is recommended. 2.  No significant  mediastinal or left hilar adenopathy. 3.  No acute infiltrate or pulmonary edema.  No pleural effusion. 4.  Degenerative changes thoracic spine.   Original Report Authenticated By: Natasha Mead, M.D.   Dg Chest Port 1 View  01/14/2013   *RADIOLOGY REPORT*  Clinical Data: 72 year old male with chest pain, bilateral lower extremity pain.  PORTABLE CHEST - 1 VIEW  Comparison: [facility] chest radiograph 01/20/2004.  Findings: Portable AP upright view of the chest at 1741 hours. New contour abnormality in the right paratracheal station with a 47 mm area of mass-like opacity inseparable from the superior right mediastinal contour. More subtle rounded increased right hilar contour abnormality compared to the prior study.  Elsewhere mediastinal contours are stable.  Stable sequelae of CABG.  Partially visible ACDF hardware.  No pneumothorax or pulmonary edema.  No pleural effusion or other confluent pulmonary opacity.  IMPRESSION: New mass-like opacity compatible with right suprahilar / paratracheal soft tissue mass.  Top differential consideration is bronchogenic carcinoma.  Recommend follow-up chest CT (IV contrast preferred).   Original Report Authenticated By: Erskine Speed, M.D.      Medications: I have reviewed the patient's current medications.  Impression: 1. Severe anemia, normocytic, status post 2 units blood transfusion. 2. Acute blood loss anemia secondary to probable upper GI bleed. On PPI. Apparent history of alcohol abuse. 3. Right lung mass, very concerning for malignancy. 4. History of coronary artery disease, stable.     Plan: 1. Continue to monitor his hemoglobin. 2. Patient is n.p.o. and gastroenterology has been consulted with a view to upper GI endoscopy. 3. Consults pulmonology regarding right lung mass. I'm sure that this can be worked up as an outpatient but we would still like an opinion.  Consultants:  Gastroenterology consultation, pending.  Pulmonology  consultation, pending.   Procedures:  None.   Antibiotics:  None.                   Code Status: Full code.  Family Communication: Discussed plan with patient at the bedside.   Disposition Plan: Home when medically stable.  Time spent: 15 minutes.   LOS: 1 day   Wilson Singer Pager [phone]  01/15/2013, 7:52 AM

## 2013-01-16 ENCOUNTER — Inpatient Hospital Stay (HOSPITAL_COMMUNITY): Payer: Medicare Other

## 2013-01-16 DIAGNOSIS — D649 Anemia, unspecified: Secondary | ICD-10-CM

## 2013-01-16 DIAGNOSIS — K921 Melena: Secondary | ICD-10-CM

## 2013-01-16 LAB — COMPREHENSIVE METABOLIC PANEL
Alkaline Phosphatase: 83 U/L (ref 39–117)
BUN: 10 mg/dL (ref 6–23)
CO2: 25 mEq/L (ref 19–32)
Chloride: 103 mEq/L (ref 96–112)
Creatinine, Ser: 1.04 mg/dL (ref 0.50–1.35)
GFR calc non Af Amer: 69 mL/min — ABNORMAL LOW (ref >90)
Glucose, Bld: 84 mg/dL (ref 70–99)
Total Bilirubin: 0.6 mg/dL (ref 0.3–1.2)

## 2013-01-16 LAB — CBC
HCT: 25.4 % — ABNORMAL LOW (ref 39.0–52.0)
Hemoglobin: 8.2 g/dL — ABNORMAL LOW (ref 13.0–17.0)
MCV: 81.2 fL (ref 78.0–100.0)
RBC: 3.13 MIL/uL — ABNORMAL LOW (ref 4.22–5.81)
WBC: 7.1 10*3/uL (ref 4.0–10.5)

## 2013-01-16 LAB — TYPE AND SCREEN
Antibody Screen: NEGATIVE
Unit division: 0

## 2013-01-16 MED ORDER — IOHEXOL 300 MG/ML  SOLN
50.0000 mL | Freq: Once | INTRAMUSCULAR | Status: AC | PRN
  Administered 2013-01-16: 50 mL via ORAL

## 2013-01-16 MED ORDER — IOHEXOL 300 MG/ML  SOLN
100.0000 mL | Freq: Once | INTRAMUSCULAR | Status: AC | PRN
  Administered 2013-01-16: 100 mL via INTRAVENOUS

## 2013-01-16 MED ORDER — BISACODYL 5 MG PO TBEC
10.0000 mg | DELAYED_RELEASE_TABLET | ORAL | Status: AC
  Administered 2013-01-16 (×2): 10 mg via ORAL
  Filled 2013-01-16 (×2): qty 2

## 2013-01-16 NOTE — Progress Notes (Signed)
Subjective: He feels about the same. He has no new complaints. EGD yesterday did not reveal a definite source of bleeding. He is scheduled for CT of abdomen and pelvis and brain today. He is scheduled for colonoscopy tomorrow  Objective: Vital signs in last 24 hours: Temp:  [98.1 F (36.7 C)-98.4 F (36.9 C)] 98.1 F (36.7 C) (08/17 0400) Pulse Rate:  [70-90] 90 (08/16 1040) Resp:  [8-18] 11 (08/16 1900) BP: (96-161)/(49-136) 96/73 mmHg (08/16 1900) SpO2:  [97 %-98 %] 98 % (08/16 1040) Weight:  [76.8 kg (169 lb 5 oz)] 76.8 kg (169 lb 5 oz) (08/17 0500) Weight change: -0.453 kg (-1 lb) Last BM Date: 01/15/13  Intake/Output from previous day: 08/16 0701 - 08/17 0700 In: 100 [I.V.:100] Out: -   PHYSICAL EXAM General appearance: alert, cooperative and no distress Resp: clear to auscultation bilaterally Cardio: regular rate and rhythm, S1, S2 normal, no murmur, click, rub or gallop GI: soft, non-tender; bowel sounds normal; no masses,  no organomegaly Extremities: extremities normal, atraumatic, no cyanosis or edema  Lab Results:    Basic Metabolic Panel:  Recent Labs  16/10/96 0217 01/16/13 0452  NA 137 139  K 4.0 3.5  CL 102 103  CO2 25 25  GLUCOSE 104* 84  BUN 10 10  CREATININE 1.18 1.04  CALCIUM 9.4 9.3   Liver Function Tests:  Recent Labs  01/15/13 0217 01/16/13 0452  AST 16 18  ALT 21 22  ALKPHOS 86 83  BILITOT 0.4 0.6  PROT 6.9 6.5  ALBUMIN 3.3* 3.1*   No results found for this basename: LIPASE, AMYLASE,  in the last 72 hours No results found for this basename: AMMONIA,  in the last 72 hours CBC:  Recent Labs  01/14/13 1750 01/15/13 0704 01/16/13 0452  WBC 7.0 8.0 7.1  NEUTROABS 4.6  --   --   HGB 5.8* 8.9* 8.2*  HCT 19.2* 28.0* 25.4*  MCV 79.3 81.2 81.2  PLT 307 343 290   Cardiac Enzymes:  Recent Labs  01/14/13 2007 01/15/13 0217 01/15/13 0704  TROPONINI <0.30 <0.30 <0.30   BNP:  Recent Labs  01/14/13 1750  PROBNP 634.6*    D-Dimer: No results found for this basename: DDIMER,  in the last 72 hours CBG: No results found for this basename: GLUCAP,  in the last 72 hours Hemoglobin A1C: No results found for this basename: HGBA1C,  in the last 72 hours Fasting Lipid Panel: No results found for this basename: CHOL, HDL, LDLCALC, TRIG, CHOLHDL, LDLDIRECT,  in the last 72 hours Thyroid Function Tests: No results found for this basename: TSH, T4TOTAL, FREET4, T3FREE, THYROIDAB,  in the last 72 hours Anemia Panel: No results found for this basename: VITAMINB12, FOLATE, FERRITIN, TIBC, IRON, RETICCTPCT,  in the last 72 hours Coagulation:  Recent Labs  01/14/13 1750  LABPROT 14.6  INR 1.16   Urine Drug Screen: Drugs of Abuse  No results found for this basename: labopia, cocainscrnur, labbenz, amphetmu, thcu, labbarb    Alcohol Level: No results found for this basename: ETH,  in the last 72 hours Urinalysis:  Recent Labs  01/14/13 1938  COLORURINE YELLOW  LABSPEC 1.015  PHURINE 5.5  GLUCOSEU NEGATIVE  HGBUR NEGATIVE  BILIRUBINUR NEGATIVE  KETONESUR NEGATIVE  PROTEINUR NEGATIVE  UROBILINOGEN 0.2  NITRITE NEGATIVE  LEUKOCYTESUR NEGATIVE   Misc. Labs:  ABGS No results found for this basename: PHART, PCO2, PO2ART, TCO2, HCO3,  in the last 72 hours CULTURES Recent Results (from the past 240  hour(s))  MRSA PCR SCREENING     Status: None   Collection Time    01/14/13 10:50 PM      Result Value Range Status   MRSA by PCR NEGATIVE  NEGATIVE Final   Comment:            The GeneXpert MRSA Assay (FDA     approved for NASAL specimens     only), is one component of a     comprehensive MRSA colonization     surveillance program. It is not     intended to diagnose MRSA     infection nor to guide or     monitor treatment for     MRSA infections.   Studies/Results: Ct Angio Chest Pe W/cm &/or Wo Cm  01/14/2013   *RADIOLOGY REPORT*  Clinical Data: Abnormal chest x-ray  CT ANGIOGRAPHY CHEST   Technique:  Multidetector CT imaging of the chest using the standard protocol during bolus administration of intravenous contrast. Multiplanar reconstructed images including MIPs were obtained and reviewed to evaluate the vascular anatomy.  Contrast: OMNIPAQUE IOHEXOL 350 MG/ML SOLN  Comparison: Chest x-ray same day  Findings: Sagittal images of the spine shows degenerative changes thoracic spine.  The patient is status post CABG.  Atherosclerotic calcifications of the thoracic aorta and coronary arteries.  Heart size is within normal limits.  As noted on chest x-ray there is irregular  elongated mass arising from right suprahilar region just above the right pulmonary artery extending in the right upper lobe.  The lesion is best visualized in the coronal image 69 measures about 6.5 cm cranial caudally by 3.3 cm.  This is highly suspicious for a primary bronchogenic carcinoma.  There is encasement and some narrowing of a subsegmental arterial branch in the right upper lobe in axial image 28. Best visualized in the coronal image 70.  Further evaluation with bronchoscopy/biopsy is recommended.  No significant mediastinal adenopathy.  No left hilar adenopathy.  No adrenal gland mass is noted in visualized upper abdomen.  No definite hepatic lesions are noted in visualized liver.  A precarinal lymph node measures 10 x 9 mm.  No acute infiltrate or pulmonary edema.  No pleural effusion.  IMPRESSION:  1.  There is a elongated irregular mass arising from the right suprahilar region extending in the right upper lobe with mild encasement of the subsegmental pulmonary artery arterial branch. The lesion measures at least 6.5 cm cranial caudally by 3.3 cm. This is highly suspicious for primary bronchogenic carcinoma. Further evaluation with bronchoscopy/biopsy is recommended. 2.  No significant mediastinal or left hilar adenopathy. 3.  No acute infiltrate or pulmonary edema.  No pleural effusion. 4.  Degenerative changes  thoracic spine.   Original Report Authenticated By: Natasha Mead, M.D.   Dg Chest Port 1 View  01/14/2013   *RADIOLOGY REPORT*  Clinical Data: 72 year old male with chest pain, bilateral lower extremity pain.  PORTABLE CHEST - 1 VIEW  Comparison: [facility] chest radiograph 01/20/2004.  Findings: Portable AP upright view of the chest at 1741 hours. New contour abnormality in the right paratracheal station with a 47 mm area of mass-like opacity inseparable from the superior right mediastinal contour. More subtle rounded increased right hilar contour abnormality compared to the prior study.  Elsewhere mediastinal contours are stable.  Stable sequelae of CABG.  Partially visible ACDF hardware.  No pneumothorax or pulmonary edema.  No pleural effusion or other confluent pulmonary opacity.  IMPRESSION: New mass-like opacity compatible with  right suprahilar / paratracheal soft tissue mass.  Top differential consideration is bronchogenic carcinoma.  Recommend follow-up chest CT (IV contrast preferred).   Original Report Authenticated By: Erskine Speed, M.D.    Medications:  Scheduled: . atorvastatin  20 mg Oral QPM  . pantoprazole  40 mg Oral BID AC  . sodium chloride  3 mL Intravenous Q12H   Continuous: . sodium chloride 50 mL/hr at 01/15/13 0900  . sodium chloride 20 mL/hr (01/15/13 1523)  . sodium chloride     AOZ:HYQMVHQIONGEX, acetaminophen, ondansetron (ZOFRAN) IV, ondansetron  Assesment: He has GI bleeding and he definite source has not been identified yet. He has a lung mass that is almost certainly lung cancer. He is undergoing workup of his acute problem and then could have pulmonary workup. His pulmonary workup does not necessarily have to be done in the hospital Principal Problem:   Upper GI bleed Active Problems:   Anemia   Lung mass   CAD (coronary artery disease)   GERD (gastroesophageal reflux disease)   Hyperlipidemia    Plan: Continue GI workup CT of abdomen and  pelvis and brain today    LOS: 2 days   Shana Zavaleta L 01/16/2013, 7:22 AM

## 2013-01-16 NOTE — Progress Notes (Signed)
[facility] PROGRESS NOTE  Jonathan Watson ZOX:[id number] DOB: [birth year] DOA: 01/14/2013 PCP: Colon Branch, MD  Assessment/Plan: 1. Acute blood loss anemia, likely from GI tract. Patient is transfuse 2 units of PRBCs. His hemoglobin has since improved. EGD was done yesterday with results as below. Plan is for colonoscopy to be done tomorrow. We'll continue to monitor hemoglobin. 2. GI bleeding. Status post endoscopy with results as below. Plan for colonoscopy tomorrow. Patient did have a bowel movement this morning was reported as normal. Hemoglobin has been relatively stable. 3. Right lung mass, concern for underlying malignancy. Appreciate pulmonology assistance. Will likely undergo further outpatient workup. 4. History of coronary artery disease, stable  Code Status: full code Family Communication: discussed with patient and family at the bedside  Disposition Plan: discharge home in the next 24-48hrs if remains stable, transfer to telemetry today.   Consultants:  Gastroenterology, Dr. Darrick Penna  Pulmonology. Dr. 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 No longer having any shortness of breath or dizziness on ambulation. Reports having one bowel movement that did not contain any blood.  Objective: Filed Vitals:   01/16/13 0700  BP:   Pulse:   Temp:   Resp: 11    Intake/Output Summary (Last 24 hours) at 01/16/13 0931 Last data filed at 01/16/13 0700  Gross per 24 hour  Intake 312.33 ml  Output      0 ml  Net 312.33 ml   Filed Weights   01/14/13 2300 01/15/13 0400 01/16/13 0500  Weight: 75.3 kg (166 lb 0.1 oz) 76.6 kg (168 lb 14 oz) 76.8 kg (169 lb 5 oz)    Exam:   General:  NAD  Cardiovascular: S1, S2 RRR  Respiratory: CTA B  Abdomen: Soft, nt,  nd, bs+  Musculoskeletal: no edema b/l   Data Reviewed: Basic Metabolic Panel:  Recent Labs Lab 01/14/13 1750 01/15/13 0217 01/16/13 0452  NA 138 137 139  K 3.7 4.0 3.5  CL 102 102 103  CO2 25 25 25   GLUCOSE 97 104* 84  BUN 10 10 10   CREATININE 1.18 1.18 1.04  CALCIUM 9.3 9.4 9.3   Liver Function Tests:  Recent Labs Lab 01/15/13 0217 01/16/13 0452  AST 16 18  ALT 21 22  ALKPHOS 86 83  BILITOT 0.4 0.6  PROT 6.9 6.5  ALBUMIN 3.3* 3.1*   No results found for this basename: LIPASE, AMYLASE,  in the last 168 hours No results found for this basename: AMMONIA,  in the last 168 hours CBC:  Recent Labs Lab 01/14/13 1750 01/15/13 0704 01/16/13 0452  WBC 7.0 8.0 7.1  NEUTROABS 4.6  --   --   HGB 5.8* 8.9* 8.2*  HCT 19.2* 28.0* 25.4*  MCV 79.3 81.2 81.2  PLT 307 343 290   Cardiac Enzymes:  Recent Labs Lab 01/14/13 1750 01/14/13 2007 01/15/13 0217 01/15/13 0704  TROPONINI <0.30 <0.30 <0.30 <0.30   BNP (last 3 results)  Recent Labs  01/14/13 1750  PROBNP 634.6*   CBG: No results found for this basename: GLUCAP,  in the last 168 hours  Recent Results (from the past 240 hour(s))  MRSA PCR SCREENING     Status: None   Collection Time    01/14/13 10:50 PM      Result Value Range Status   MRSA by PCR NEGATIVE  NEGATIVE Final   Comment:            The GeneXpert MRSA Assay (FDA     approved for NASAL specimens     only), is one component of a     comprehensive MRSA colonization     surveillance program. It is not     intended to diagnose MRSA     infection nor to guide or     monitor treatment for     MRSA infections.     Studies: Ct Angio Chest Pe W/cm &/or Wo Cm  01/14/2013   *RADIOLOGY REPORT*  Clinical Data: Abnormal chest x-ray  CT ANGIOGRAPHY CHEST  Technique:  Multidetector CT imaging of the chest using the standard protocol during bolus administration of intravenous contrast. Multiplanar reconstructed images including MIPs were obtained and  reviewed to evaluate the vascular anatomy.  Contrast: OMNIPAQUE IOHEXOL 350 MG/ML SOLN  Comparison: Chest x-ray same day  Findings: Sagittal images of the spine shows degenerative changes thoracic spine.  The patient is status post CABG.  Atherosclerotic calcifications of the thoracic aorta and coronary arteries.  Heart size is within normal limits.  As noted on chest x-ray there is irregular  elongated mass arising from right suprahilar region just above the right pulmonary artery extending in the right upper lobe.  The lesion is best visualized in the coronal image 69 measures about 6.5 cm cranial caudally by 3.3 cm.  This is highly suspicious for a primary bronchogenic carcinoma.  There is encasement and some narrowing of a subsegmental arterial branch in the right upper lobe in axial image 28. Best visualized in the coronal image 70.  Further evaluation with bronchoscopy/biopsy is recommended.  No significant mediastinal adenopathy.  No left hilar adenopathy.  No adrenal gland mass is noted in visualized upper abdomen.  No definite hepatic lesions are noted in visualized liver.  A precarinal lymph node measures 10 x 9 mm.  No acute infiltrate or pulmonary edema.  No pleural effusion.  IMPRESSION:  1.  There is a elongated irregular mass arising from the right suprahilar region extending in the right upper lobe with mild encasement of the subsegmental pulmonary artery arterial branch. The lesion measures at least 6.5 cm cranial caudally by 3.3 cm. This is highly suspicious for primary bronchogenic carcinoma. Further evaluation with bronchoscopy/biopsy is recommended. 2.  No significant mediastinal or left hilar adenopathy. 3.  No acute infiltrate or pulmonary edema.  No pleural effusion. 4.  Degenerative changes thoracic spine.   Original Report Authenticated By: Natasha Mead, M.D.   Dg Chest Port 1 View  01/14/2013   *RADIOLOGY REPORT*  Clinical Data: 72 year old male with chest pain, bilateral lower  extremity pain.  PORTABLE CHEST - 1 VIEW  Comparison: [facility] chest radiograph 01/20/2004.  Findings: Portable AP upright view of the chest at 1741 hours. New contour abnormality in the right paratracheal station with a 47 mm area of mass-like opacity inseparable from the superior right mediastinal contour. More subtle rounded increased right hilar contour abnormality compared to the prior study.  Elsewhere mediastinal contours are stable.  Stable sequelae of CABG.  Partially visible ACDF hardware.  No pneumothorax or pulmonary edema.  No pleural effusion or other confluent pulmonary opacity.  IMPRESSION: New mass-like opacity compatible with right suprahilar / paratracheal soft tissue mass.  Top differential consideration is bronchogenic carcinoma.  Recommend follow-up chest CT (IV contrast preferred).   Original Report Authenticated By: Erskine Speed, M.D.  Scheduled Meds: . atorvastatin  20 mg Oral QPM  . pantoprazole  40 mg Oral BID AC  . sodium chloride  3 mL Intravenous Q12H   Continuous Infusions: . sodium chloride 50 mL/hr at 01/15/13 0900  . sodium chloride 20 mL/hr (01/15/13 1523)  . sodium chloride      Principal Problem:   Upper GI bleed Active Problems:   Anemia   Lung mass   CAD (coronary artery disease)   GERD (gastroesophageal reflux disease)   Hyperlipidemia    Time spent:    MEMON,JEHANZEB  [facility] Pager [phone]. If 7PM-7AM, please contact night-coverage at [email or web address], password [facility] 01/16/2013, 9:31 AM  LOS: 2 days

## 2013-01-16 NOTE — Progress Notes (Signed)
Updated Dr. Darrick Penna on the patients bowel prep.  Voiced to her that the entire jug of golytley had been consumed and that he had both orders of dulcolax tablets.  I also read the patients CT scan to her.  Will continue to monitor him.

## 2013-01-16 NOTE — Progress Notes (Signed)
Subjective: Since I last evaluated the patient UNABLE TO COMPLETE BOWEL PREP. MENTAL STATUS IMPROVED BUT PT STILL CONFUSED. FAMILY ANTS TO KNOW WHEN HE WILL GO HOME. STILL SEEING BLACK STOOL WITH BOWEL PREP. GO-LYTE 2/3 CONSUMED.  Objective: Vital signs in last 24 hours: Temp:  [98.1 F (36.7 C)-98.4 F (36.9 C)] 98.1 F (36.7 C) (08/17 0400) Pulse Rate:  [70-90] 90 (08/16 1040) Resp:  [8-18] 11 (08/17 0700) BP: (96-160)/(49-73) 132/51 mmHg (08/17 0300) SpO2:  [97 %-98 %] 98 % (08/16 1040) Weight:  [169 lb 5 oz (76.8 kg)] 169 lb 5 oz (76.8 kg) (08/17 0500) Last BM Date: 01/16/13  Intake/Output from previous day: 08/16 0701 - 08/17 0700 In: 412.3 [I.V.:412.3] Out: -  Intake/Output this shift:    General appearance: alert and no distress Resp: clear to auscultation bilaterally Cardio: regular rate and rhythm GI: soft, non-tender; bowel sounds normal; no masses,  no organomegaly  Lab Results:  Recent Labs  01/14/13 1750 01/15/13 0704 01/16/13 0452  WBC 7.0 8.0 7.1  HGB 5.8* 8.9* 8.2*  HCT 19.2* 28.0* 25.4*  PLT 307 343 290   BMET  Recent Labs  01/14/13 1750 01/15/13 0217 01/16/13 0452  NA 138 137 139  K 3.7 4.0 3.5  CL 102 102 103  CO2 25 25 25   GLUCOSE 97 104* 84  BUN 10 10 10   CREATININE 1.18 1.18 1.04  CALCIUM 9.3 9.4 9.3   LFT  Recent Labs  01/16/13 0452  PROT 6.5  ALBUMIN 3.1*  AST 18  ALT 22  ALKPHOS 83  BILITOT 0.6   PT/INR  Recent Labs  01/14/13 1750  LABPROT 14.6  INR 1.16   Hepatitis Panel No results found for this basename: HEPBSAG, HCVAB, HEPAIGM, HEPBIGM,  in the last 72 hours C-Diff No results found for this basename: CDIFFTOX,  in the last 72 hours Fecal Lactopherrin No results found for this basename: FECLLACTOFRN,  in the last 72 hours  Studies/Results: Ct Angio Chest Pe W/cm &/or Wo Cm 01/14/2013  : R LUNG MASSBAUG 17: CT HEAD: NAICP, CT A/P PENDING  Medications: I have reviewed the patient's current  medications.  Assessment/Plan: ADMITTED WITH MELENA AND ANEMIA-ETIOLOGY UNCLEAR:? R SIDED COLON CA, LESS LIKELY SMALL BOWEL/COLON ULCER  PLAN: 1. DULCOLAX TODAY 2. ENEMAS TODAY AND TOMORROW 3. COMPLETE GO-LYTE. PLAN TCS AUG 18. GIVENS CAPSULE IF NO SOURCE FOR MELENA/ANEMIA FOUND. 4. AWAIT CT A/P   LOS: 2 days   Ranferi Clingan 01/16/2013, 9:35 AM

## 2013-01-17 ENCOUNTER — Encounter (HOSPITAL_COMMUNITY)
Admission: EM | Disposition: A | Discharge status: Home / Self Care | Payer: Self-pay | Source: Home / Self Care | Attending: Internal Medicine

## 2013-01-17 ENCOUNTER — Encounter (HOSPITAL_COMMUNITY): Payer: Self-pay | Admitting: *Deleted

## 2013-01-17 DIAGNOSIS — K639 Disease of intestine, unspecified: Secondary | ICD-10-CM | POA: Insufficient documentation

## 2013-01-17 DIAGNOSIS — K648 Other hemorrhoids: Secondary | ICD-10-CM

## 2013-01-17 DIAGNOSIS — R634 Abnormal weight loss: Secondary | ICD-10-CM

## 2013-01-17 DIAGNOSIS — K921 Melena: Secondary | ICD-10-CM

## 2013-01-17 HISTORY — PX: COLONOSCOPY: SHX5424

## 2013-01-17 LAB — CBC
Platelets: 334 10*3/uL (ref 150–400)
RBC: 3.27 MIL/uL — ABNORMAL LOW (ref 4.22–5.81)
RDW: 16.3 % — ABNORMAL HIGH (ref 11.5–15.5)
WBC: 7.2 10*3/uL (ref 4.0–10.5)

## 2013-01-17 LAB — BASIC METABOLIC PANEL
CO2: 26 mEq/L (ref 19–32)
Calcium: 9.1 mg/dL (ref 8.4–10.5)
Chloride: 104 mEq/L (ref 96–112)
GFR calc Af Amer: 80 mL/min — ABNORMAL LOW (ref >90)
Sodium: 140 mEq/L (ref 135–145)

## 2013-01-17 SURGERY — COLONOSCOPY
Anesthesia: Moderate Sedation

## 2013-01-17 MED ORDER — MEPERIDINE HCL 100 MG/ML IJ SOLN
INTRAMUSCULAR | Status: DC | PRN
  Administered 2013-01-17 (×2): 25 mg via INTRAVENOUS

## 2013-01-17 MED ORDER — MIDAZOLAM HCL 5 MG/5ML IJ SOLN
INTRAMUSCULAR | Status: AC
  Filled 2013-01-17: qty 10

## 2013-01-17 MED ORDER — OMEPRAZOLE 20 MG PO CPDR: 20.0000 mg | DELAYED_RELEASE_CAPSULE | Freq: Two times a day (BID) | ORAL | Status: DC

## 2013-01-17 MED ORDER — MEPERIDINE HCL 100 MG/ML IJ SOLN
INTRAMUSCULAR | Status: AC
  Filled 2013-01-17: qty 1

## 2013-01-17 MED ORDER — MIDAZOLAM HCL 5 MG/5ML IJ SOLN
INTRAMUSCULAR | Status: DC | PRN
  Administered 2013-01-17 (×2): 2 mg via INTRAVENOUS

## 2013-01-17 MED ORDER — STERILE WATER FOR IRRIGATION IR SOLN
Status: DC | PRN
  Administered 2013-01-17: 15:00:00

## 2013-01-17 NOTE — Progress Notes (Signed)
[facility] PROGRESS NOTE  Jonathan Watson AVW:[id number] DOB: May 22, 1941 DOA: 01/14/2013 PCP: Colon Branch, MD  Assessment/Plan: 1. Acute blood loss anemia, likely from GI tract. Patient is transfuse 2 units of PRBCs. His hemoglobin has since improved. EGD was done with results as below. Plan is for colonoscopy today. We'll continue to monitor hemoglobin. 2. GI bleeding. Status post endoscopy with results as below. Plan for colonoscopy today.  Bleeding appears to have resolved. Hemoglobin has been relatively stable. CT of the abd/pelvis shows thickening in small bowel, concerning for malignancy.  Await further recommendations from GI. 3. Right lung mass, concern for underlying malignancy. Appreciate pulmonology assistance. Will likely undergo further outpatient workup. 4. History of coronary artery disease, stable  Code Status: full code Family Communication: discussed with patient and family at the bedside  Disposition Plan: discharge home once cleared by GI.   Consultants:  Gastroenterology, Dr. Darrick Penna  Pulmonology. Dr. 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 Feeling good.  Wants to go home.  Objective: Filed Vitals:   01/17/13 0728  BP:   Pulse:   Temp: 98.7 F (37.1 C)  Resp:     Intake/Output Summary (Last 24 hours) at 01/17/13 1009 Last data filed at 01/17/13 0600  Gross per 24 hour  Intake 3593.75 ml  Output      0 ml  Net 3593.75 ml   Filed Weights   01/15/13 0400 01/16/13 0500 01/17/13 0300  Weight: 76.6 kg (168 lb 14 oz) 76.8 kg (169 lb 5 oz) 77.5 kg (170 lb 13.7 oz)    Exam:   General:  NAD  Cardiovascular: S1, S2 RRR  Respiratory: CTA B  Abdomen: Soft, nt, nd, bs+  Musculoskeletal: no edema b/l   Data Reviewed: Basic Metabolic  Panel:  Recent Labs Lab 01/14/13 1750 01/15/13 0217 01/16/13 0452 01/17/13 0504  NA 138 137 139 140  K 3.7 4.0 3.5 3.5  CL 102 102 103 104  CO2 25 25 25 26   GLUCOSE 97 104* 84 104*  BUN 10 10 10 6   CREATININE 1.18 1.18 1.04 1.04  CALCIUM 9.3 9.4 9.3 9.1   Liver Function Tests:  Recent Labs Lab 01/15/13 0217 01/16/13 0452  AST 16 18  ALT 21 22  ALKPHOS 86 83  BILITOT 0.4 0.6  PROT 6.9 6.5  ALBUMIN 3.3* 3.1*   No results found for this basename: LIPASE, AMYLASE,  in the last 168 hours No results found for this basename: AMMONIA,  in the last 168 hours CBC:  Recent Labs Lab 01/14/13 1750 01/15/13 0704 01/16/13 0452 01/17/13 0504  WBC 7.0 8.0 7.1 7.2  NEUTROABS 4.6  --   --   --   HGB 5.8* 8.9* 8.2* 8.4*  HCT 19.2* 28.0* 25.4* 26.2*  MCV 79.3 81.2 81.2 80.1  PLT 307 343 290 334   Cardiac Enzymes:  Recent Labs Lab 01/14/13 1750 01/14/13 2007 01/15/13 0217 01/15/13 0704  TROPONINI <0.30 <0.30 <0.30 <0.30   BNP (last 3 results)  Recent Labs  01/14/13 1750  PROBNP 634.6*   CBG: No results found for this basename: GLUCAP,  in the last 168 hours  Recent Results (from the past 240 hour(s))  MRSA PCR SCREENING     Status: None   Collection Time    01/14/13 10:50 PM      Result Value  Range Status   MRSA by PCR NEGATIVE  NEGATIVE Final   Comment:            The GeneXpert MRSA Assay (FDA     approved for NASAL specimens     only), is one component of a     comprehensive MRSA colonization     surveillance program. It is not     intended to diagnose MRSA     infection nor to guide or     monitor treatment for     MRSA infections.     Studies: Ct Head Wo Contrast  01/16/2013   *RADIOLOGY REPORT*  Clinical Data: Lung mass.  Unintentional weight loss.  Staging.  CT HEAD WITHOUT CONTRAST  Technique:  Contiguous axial images were obtained from the base of the skull through the vertex without contrast.  Comparison: None.  Findings: The brain shows  generalized atrophy.  There is no evidence of old or acute focal infarction, mass lesion, hemorrhage, hydrocephalus or extra-axial collection.  The calvarium is unremarkable.  No significant sinus disease.  There is atherosclerotic calcification of the major vessels at the base of the brain.  IMPRESSION: Atrophy.  No focal or acute finding.   Original Report Authenticated By: Paulina Fusi, M.D.   Ct Abdomen Pelvis W Contrast  01/16/2013   *RADIOLOGY REPORT*  Clinical Data: Lung mass and weight loss.  The patient complains of mid abdominal pain.  CT ABDOMEN AND PELVIS WITH CONTRAST  Technique:  Multidetector CT imaging of the abdomen and pelvis was performed following the standard protocol during bolus administration of intravenous contrast.  Contrast: 50mL OMNIPAQUE IOHEXOL 300 MG/ML  SOLN, OMNIPAQUE IOHEXOL 300 MG/ML  SOLN  Comparison: Chest CT 01/14/2013  Findings: A few new densities at the left lung base are suggestive for atelectasis.  Otherwise, the lung bases are clear. No evidence for free intraperitoneal air.  There are multiple tiny hypoechoic structures in the liver parenchyma.  These are too small to definitively characterize.  The largest measures 5 mm near the hepatic dome.  Normal appearance of the gallbladder, portal venous system, pancreas, spleen, adrenal glands and both kidneys.  There is a 4 mm stone in the left kidney mid pole suggestive for a nonobstructive stone.  Fluid in the urinary bladder and no evidence for ureter stones or hydronephrosis.  The abdominal aorta is heavily calcified without aneurysmal dilatation.  There are calcifications involving the prostate.  No gross abnormality to the prostate or seminal vesicles.  There is focal small bowel wall thickening in the lower mid abdomen on sequence two, image 54.  There is a second area of focal small bowel wall thickening in the left lower abdomen on image number 46. The wall thickening in the left lower abdomen on image 46 has an  exophytic component and raises concern for a mass lesion.  No significant mesenteric lymphadenopathy.  No clear evidence for peritoneal implants.  No significant inflammation surrounding the areas of small bowel wall thickening.  Degenerative changes in the lumbar spine without acute bony abnormality.  No suspicious bone lesions.  IMPRESSION: There are two areas of small bowel wall thickening without acute inflammatory changes.  These findings raise concern for neoplastic processes.  Metastatic disease cannot be excluded based on the patient's known right lung mass.  Multiple small low density structures throughout the liver that are too small to definitively characterize.  However, these probably represent small cysts.  Diffuse atherosclerotic disease involving the abdominal aorta.  Nonobstructive  left kidney stone.   Original Report Authenticated By: Richarda Overlie, M.D.    Scheduled Meds: . atorvastatin  20 mg Oral QPM  . pantoprazole  40 mg Oral BID AC  . sodium chloride  3 mL Intravenous Q12H   Continuous Infusions: . sodium chloride 75 mL/hr at 01/16/13 1721  . sodium chloride 20 mL/hr (01/15/13 1523)  . sodium chloride      Principal Problem:   Upper GI bleed Active Problems:   Anemia   Lung mass   CAD (coronary artery disease)   GERD (gastroesophageal reflux disease)   Hyperlipidemia    Time spent:    Jonathan Watson  [facility] Pager [phone]. If 7PM-7AM, please contact night-coverage at [email or web address], password [facility] 01/17/2013, 10:09 AM  LOS: 3 days

## 2013-01-17 NOTE — Interval H&P Note (Signed)
History and Physical Interval Note:  01/17/2013 2:16 PM  Jonathan Watson  has presented today for surgery, with the diagnosis of MELENA/ANEMIA  The various methods of treatment have been discussed with the patient and family. After consideration of risks, benefits and other options for treatment, the patient has consented to  Procedure(s): COLONOSCOPY (N/A) as a surgical intervention .  The patient's history has been reviewed, patient examined, no change in status, stable for surgery.  I have reviewed the patient's chart and labs.  Questions were answered to the patient's satisfaction.     [employer]

## 2013-01-17 NOTE — Progress Notes (Signed)
He says he feels about the same. He seems a little less confused. He has not had any overt bleeding in the last 24 hours. His CT abdomen showed areas that were worrisome for metastatic disease but not diagnostic. CT brain did not show any evidence of metastases. He is scheduled for colonoscopy today.  Exam shows he is awake and alert. Temperature is 98.7 respirations 14 blood pressure 163/74 and his pulse in the 80s. O2 saturations 98%. His chest is relatively clear. His heart is regular. His abdomen is soft  He has what is probably lung cancer. I am going to review the CT chest and see if this would be amenable to bronchoscopy here. We are not doing transbronchial biopsies at this institution source it looks like he'll need a transbronchial biopsy he will need to have that done in [facility]

## 2013-01-17 NOTE — Progress Notes (Signed)
UR chart review completed.  

## 2013-01-17 NOTE — Progress Notes (Signed)
Tap water enema administered.  Pt tolerated the procedure without difficulty.  The results was that the stool was clear and only a tinge of greenish-yellow color was noted to the stool.

## 2013-01-17 NOTE — Op Note (Signed)
[facility] [address] [place], [address]   COLONOSCOPY PROCEDURE REPORT  PATIENT: Jonathan Watson, Jonathan Watson  MR#: [id number] BIRTHDATE: [birth year] , 72  yrs. old GENDER: Male ENDOSCOPIST: Jonette Eva, MD REFERRED XB:JYNWG Qureshi, M.D. PROCEDURE DATE:  01/17/2013 PROCEDURE:   Colonoscopy, diagnostic INDICATIONS:WEIGHT LOSS, MELENA. MEDICATIONS: Demerol 50 mg IV and Versed 4 mg IV  DESCRIPTION OF PROCEDURE:    Physical exam was performed.  Informed consent was obtained from the patient after explaining the benefits, risks, and alternatives to procedure.  The patient was connected to monitor and placed in left lateral position. Continuous oxygen was provided by nasal cannula and IV medicine administered through an indwelling cannula.  After administration of sedation and rectal exam, the patients rectum was intubated and the EC-[id number] ([id number])  colonoscope was advanced under direct visualization to the ileum.  The scope was removed slowly by carefully examining the color, texture, anatomy, and integrity mucosa on the way out.  The patient was recovered in endoscopy and discharged home in satisfactory condition.    COLON FINDINGS: The mucosa appeared normal in the terminal ileum.  , A normal appearing cecum, ileocecal valve, and appendiceal orifice were identified.  The ascending, hepatic flexure, transverse, splenic flexure, descending, sigmoid colon and rectum appeared unremarkable.  No polyps or cancers were seen.  , and Small internal hemorrhoids were found.  PREP QUALITY: good.   CECAL W/D TIME: 16 minutes     COMPLICATIONS: None  ENDOSCOPIC IMPRESSION: 1.   Normal mucosa in the terminal ileum 2.   Normal colon 3.   Small internal hemorrhoids  RECOMMENDATIONS: ADVANCE DIET OUTPATIENT DOUBLE BALLOON ENTEROSCOPY LIKELY ORAL ROUTE BID PPI       _______________________________ eSignedJonette Eva, MD 01/17/2013 3:22 PM

## 2013-01-17 NOTE — Progress Notes (Signed)
D/c instructions reviewed with wife.  Verbalized understanding.  Pt dc'd to home with wife. [place], [name] 01/17/2013

## 2013-01-17 NOTE — H&P (View-Only) (Signed)
Referring Provider: No ref. provider found Primary Care Physician:  Colon Branch, MD Primary Gastroenterologist:  Jonette Eva  Reason for Consultation:  MELENA/ANEMIA  HPI:  PT ADMITTED WITH MENTAL STATUS CHANGES, SOB, CHEST PAIN, AND ANEMIA. REPORTS BLACK STOOLS. HEMOGLOBIN LOW. PT CAN'T REMEMBER HIS BIRTHDAY. DURING WORKUP CT SHOWS LARGE RIGHT LUNG MASS. CAN'T REMEMBER WHEN HE HAD A TCS. NO ASPIRIN, BC, GOODYS, IBUPROFEN/MORTIN, OR NAPROXEN/ALEVE. DENIED ETOH. PT DENIES FEVER, CHILLS, BRBPR, nausea, vomiting, diarrhea, constipation, abd pain, problems swallowing, OR heartburn or indigestion. WIFE STATES PT HAS NEVER HAD A TCS & HAS LOST WEIGHT(> 10 LBS). SX BEGAN ONE MO AGO.  Past Medical History  Diagnosis Date  . MI (myocardial infarction)   . Hypertension   . Hyperlipidemia   . Coronary artery disease    Past Surgical History  Procedure Laterality Date  . Appendectomy    . Coronary artery bypass graft    . Cervical spine surgery    NO ADDITIONAL ABDOMINAL SURGERIES  Prior to Admission medications   Medication Sig Start Date End Date Taking? Authorizing Provider  atorvastatin (LIPITOR) 20 MG tablet Take 20 mg by mouth every evening.   Yes Historical Provider, MD  GARLIC PO Take 1 tablet by mouth every evening.   Yes Historical Provider, MD  metoprolol tartrate (LOPRESSOR) 25 MG tablet Take 25 mg by mouth every evening.   Yes Historical Provider, MD  omeprazole (PRILOSEC) 20 MG capsule Take 20 mg by mouth every evening.   Yes Historical Provider, MD    Current Facility-Administered Medications  Medication Dose Route Frequency Provider Last Rate Last Dose  . 0.9 %  sodium chloride infusion   Intravenous Continuous Osvaldo Shipper, MD 50 mL/hr at 01/15/13 0603    . acetaminophen (TYLENOL) tablet 650 mg  650 mg Oral Q6H PRN Osvaldo Shipper, MD       Or  . acetaminophen (TYLENOL) suppository 650 mg  650 mg Rectal Q6H PRN Osvaldo Shipper, MD      . atorvastatin (LIPITOR) tablet  20 mg  20 mg Oral QPM Osvaldo Shipper, MD      . ondansetron (ZOFRAN) tablet 4 mg  4 mg Oral Q6H PRN Osvaldo Shipper, MD       Or  . ondansetron (ZOFRAN) injection 4 mg  4 mg Intravenous Q6H PRN Osvaldo Shipper, MD      . pantoprazole (PROTONIX) injection 40 mg  40 mg Intravenous Q12H Osvaldo Shipper, MD      . sodium chloride 0.9 % injection 3 mL  3 mL Intravenous Q12H Osvaldo Shipper, MD   3 mL at 01/14/13 2323    Allergies as of 01/14/2013  . (No Known Allergies)    Family History:  Colon Cancer  Positive: ? ON HIS DAD SIDE OF THE FAMILY                           Polyps  negative   History   Social History  . Marital Status: Married    Spouse Name: N/A    Number of Children: N/A  . Years of Education: N/A   Social History Main Topics  . Smoking status: Former Games developer  . Smokeless tobacco: Not on file  . Alcohol Use: No  . Drug Use: No     Review of Systems: .LIMITED DUE TO PT COGNITIVE DYSFUNCTION  Vitals: Blood pressure 123/72, pulse 85, temperature 98.5 F (36.9 C), temperature source Oral, resp. rate 12, height 5\' 7"  (1.702  m), weight 168 lb 14 oz (76.6 kg), SpO2 99.00%.  Physical Exam: General:   Alert,  and cooperative in NAD Head:  Normocephalic and atraumatic. Eyes:  Sclera clear, no icterus.   Conjunctiva pink. Mouth:  No deformity or lesions, dentition ABnormal. Neck:  Supple; no masses. Lungs:  Clear throughout to auscultation.   No wheezes. No acute distress. Heart:  Regular rate and rhythm; no murmurs, clicks, rubs,  or gallops. Abdomen:  Soft, nontender and nondistended. No masses noted. Normal bowel sounds, without guarding, and without rebound.   Msk:  Symmetrical without gross deformities. Normal posture. Extremities:  Without edema. BIL SCDs IN PLACE Neurologic:  Alert and INTERACTIVE;  NO  NEW FOCAL DEFICITS Cervical Nodes:  No significant cervical adenopathy. Psych:  Alert and cooperative. Normal mood and FLAT affect.   Lab Results:  Recent  Labs  01/14/13 1750 01/15/13 0704  WBC 7.0 8.0  HGB 5.8* 8.9*  HCT 19.2* 28.0*  PLT 307 343   BMET  Recent Labs  01/14/13 1750 01/15/13 0217  NA 138 137  K 3.7 4.0  CL 102 102  CO2 25 25  GLUCOSE 97 104*  BUN 10 10  CREATININE 1.18 1.18  CALCIUM 9.3 9.4   LFT  Recent Labs  01/15/13 0217  PROT 6.9  ALBUMIN 3.3*  AST 16  ALT 21  ALKPHOS 86  BILITOT 0.4     Studies/Results: CT CHEST: RUL LOBE MASS-NO INVOLVEMENT OF THE MEDIASTINUM. I REVIEWED FILM WITH DR. Eppie Gibson.  Impression: MELENA-ETIOLOGY UNCLEAR DIFFERENTIAL DIAGNOSIS DIEULAFOY'S, PUD, AVM, AND LESS LIKELY GASTRIC OR ESOPHAGEAL CA.  Plan: 1. DAILY PPI 2. EGD TODAY. CONSIDER TCS +/- CAPSULE ENDOSCOPY IF NO SOURCE FOR MELENA FOUND 3. NPO EXCEPT MEDS/ICE CHIPS   LOS: 1 day   [facility]  01/15/2013, 8:05 AM

## 2013-01-18 ENCOUNTER — Encounter (HOSPITAL_COMMUNITY): Payer: Self-pay | Admitting: Gastroenterology

## 2013-01-18 NOTE — Discharge Summary (Signed)
Physician Discharge Summary  DARRNELL MANGIARACINA ION:[id number] DOB: April 17, 1941 DOA: 01/14/2013  PCP: Colon Branch, MD  Admit date: 01/14/2013 Discharge date: 01/17/2013  Time spent: 45 minutes  Recommendations for Outpatient Follow-up:  1. Patient will be referred to chest surgeons in [place] by Dr. Juanetta Gosling for further work up of lung mass 2. He will be referred to [facility] for small bowel enteroscopy by Dr. Darrick Penna  Discharge Diagnoses:  Principal Problem:   Upper GI bleed Active Problems:   Anemia   Lung mass   CAD (coronary artery disease)   GERD (gastroesophageal reflux disease)   Hyperlipidemia   Discharge Condition: improved  Diet recommendation: low salt  Filed Weights   01/15/13 0400 01/16/13 0500 01/17/13 0300  Weight: 76.6 kg (168 lb 14 oz) 76.8 kg (169 lb 5 oz) 77.5 kg (170 lb 13.7 oz)    History of present illness:  Jonathan Watson is a 72 y.o. male with a past medical history of coronary artery disease, hyperlipidemia, GERD, who appears have some kind of a cognitive impairment. His wife states that he has been having a lot of memory issues. She thinks that he may have early dementia. So history was limited from the patient. However, it appears, that the patient has been having dizziness and weakness for the last few days. He's been having shortness of breath with exertion, and had some chest pain earlier today. Denies any headaches. He had noticed black stools about a week ago. He had a few episodes of that according to the wife. He denies any abdominal pain currently. Denies any nausea, vomiting. No emesis of blood. No frank blood per rectum. No cough. No syncopal episodes. He does have a history of acid reflux and takes Prilosec. Denies taking any pain medicine, per se. Because of the dizziness, and tiredness they decided to come in to the hospital. Patient denies any chest pain currently. He denies any other discomfort at this time. He's never had endoscopy  in the past. He also admits to weight loss of about 4 pounds in the last few weeks and about 20 pounds over the last one year. He had blood work by his primary care physician a few weeks ago, and everything according to the patient was normal. History is limited.   Hospital Course:  This patient was admitted for chest pain and weakness.  He was found to have a lung mass on imaging.  He was seen by Dr.  Juanetta Gosling who has recommended outpatient work up.  He was also found to be significantly anemic and was transfused 2 unit prbc. His hemoglobin remained stable after transfusion.  He underwent EGD and colonoscopy which were unremarkable.  He also had a CT of the abdomen pelvis done which showed some possible small bowel lesions, could not exclude malignancy.  He will need small bowel enteroscopy at [facility].  He is felt stable to discharge home.  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 Normal mucosa in the terminal ileum  2. Normal colon  3. Small internal hemorrhoids   Consultations:  Gastroenterology, Dr. Darrick Penna  Pulmonology, Dr. Juanetta Gosling  Discharge Exam: Filed Vitals:   01/17/13 1510  BP: 122/98  Pulse: 90  Temp:   Resp: 18    General: NAD Cardiovascular: S1, S2 RRR Respiratory: CTA B  Discharge Instructions  Discharge Orders   Future Orders Complete By  Expires   Call MD for:  extreme fatigue  As directed    Call MD for:  persistant dizziness or light-headedness  As directed    Call MD for:  severe uncontrolled pain  As directed    Diet - low sodium heart healthy  As directed    Increase activity slowly  As directed        Medication List         atorvastatin 20 MG tablet  Commonly known as:  LIPITOR  Take 20 mg by mouth every evening.     GARLIC PO  Take 1 tablet by mouth every evening.     metoprolol tartrate  25 MG tablet  Commonly known as:  LOPRESSOR  Take 25 mg by mouth every evening.     omeprazole 20 MG capsule  Commonly known as:  PRILOSEC  Take 1 capsule (20 mg total) by mouth 2 (two) times daily.       No Known Allergies     Follow-up Information   Follow up with Colon Branch, MD. Schedule an appointment as soon as possible for a visit in 2 weeks.   Specialty:  Internal Medicine   Contact information:   [address] [place] [address] [phone]       Follow up with Jonette Eva, MD. (will call you with appointment)    Specialty:  Gastroenterology   Contact information:   [address] [place] [address] [phone]       Follow up with HAWKINS,EDWARD L, MD. (will call you with appointment)    Specialty:  Pulmonary Disease   Contact information:   [address] [place] [address] [phone]        The results of significant diagnostics from this hospitalization (including imaging, microbiology, ancillary and laboratory) are listed below for reference.    Significant Diagnostic Studies: Ct Head Wo Contrast  01/16/2013   *RADIOLOGY REPORT*  Clinical Data: Lung mass.  Unintentional weight loss.  Staging.  CT HEAD WITHOUT CONTRAST  Technique:  Contiguous axial images were obtained from the base of the skull through the vertex without contrast.  Comparison: None.  Findings: The brain shows generalized atrophy.  There is no evidence of old or acute focal infarction, mass lesion, hemorrhage, hydrocephalus or extra-axial collection.  The calvarium is unremarkable.  No significant sinus disease.  There is atherosclerotic calcification of the major vessels at the base of the brain.  IMPRESSION: Atrophy.  No focal or acute finding.   Original Report Authenticated By: Paulina Fusi, M.D.   Ct Angio Chest Pe W/cm &/or Wo Cm  01/14/2013   *RADIOLOGY REPORT*  Clinical Data: Abnormal chest x-ray  CT ANGIOGRAPHY CHEST   Technique:  Multidetector CT imaging of the chest using the standard protocol during bolus administration of intravenous contrast. Multiplanar reconstructed images including MIPs were obtained and reviewed to evaluate the vascular anatomy.  Contrast: OMNIPAQUE IOHEXOL 350 MG/ML SOLN  Comparison: Chest x-ray same day  Findings: Sagittal images of the spine shows degenerative changes thoracic spine.  The patient is status post CABG.  Atherosclerotic calcifications of the thoracic aorta and coronary arteries.  Heart size is within normal limits.  As noted on chest x-ray there is irregular  elongated mass arising from right suprahilar region just above the right pulmonary artery extending in the right upper lobe.  The lesion is best visualized in the coronal image 69 measures about 6.5 cm  cranial caudally by 3.3 cm.  This is highly suspicious for a primary bronchogenic carcinoma.  There is encasement and some narrowing of a subsegmental arterial branch in the right upper lobe in axial image 28. Best visualized in the coronal image 70.  Further evaluation with bronchoscopy/biopsy is recommended.  No significant mediastinal adenopathy.  No left hilar adenopathy.  No adrenal gland mass is noted in visualized upper abdomen.  No definite hepatic lesions are noted in visualized liver.  A precarinal lymph node measures 10 x 9 mm.  No acute infiltrate or pulmonary edema.  No pleural effusion.  IMPRESSION:  1.  There is a elongated irregular mass arising from the right suprahilar region extending in the right upper lobe with mild encasement of the subsegmental pulmonary artery arterial branch. The lesion measures at least 6.5 cm cranial caudally by 3.3 cm. This is highly suspicious for primary bronchogenic carcinoma. Further evaluation with bronchoscopy/biopsy is recommended. 2.  No significant mediastinal or left hilar adenopathy. 3.  No acute infiltrate or pulmonary edema.  No pleural effusion. 4.  Degenerative changes  thoracic spine.   Original Report Authenticated By: Natasha Mead, M.D.   Ct Abdomen Pelvis W Contrast  01/16/2013   *RADIOLOGY REPORT*  Clinical Data: Lung mass and weight loss.  The patient complains of mid abdominal pain.  CT ABDOMEN AND PELVIS WITH CONTRAST  Technique:  Multidetector CT imaging of the abdomen and pelvis was performed following the standard protocol during bolus administration of intravenous contrast.  Contrast: 50mL OMNIPAQUE IOHEXOL 300 MG/ML  SOLN, OMNIPAQUE IOHEXOL 300 MG/ML  SOLN  Comparison: Chest CT 01/14/2013  Findings: A few new densities at the left lung base are suggestive for atelectasis.  Otherwise, the lung bases are clear. No evidence for free intraperitoneal air.  There are multiple tiny hypoechoic structures in the liver parenchyma.  These are too small to definitively characterize.  The largest measures 5 mm near the hepatic dome.  Normal appearance of the gallbladder, portal venous system, pancreas, spleen, adrenal glands and both kidneys.  There is a 4 mm stone in the left kidney mid pole suggestive for a nonobstructive stone.  Fluid in the urinary bladder and no evidence for ureter stones or hydronephrosis.  The abdominal aorta is heavily calcified without aneurysmal dilatation.  There are calcifications involving the prostate.  No gross abnormality to the prostate or seminal vesicles.  There is focal small bowel wall thickening in the lower mid abdomen on sequence two, image 54.  There is a second area of focal small bowel wall thickening in the left lower abdomen on image number 46. The wall thickening in the left lower abdomen on image 46 has an exophytic component and raises concern for a mass lesion.  No significant mesenteric lymphadenopathy.  No clear evidence for peritoneal implants.  No significant inflammation surrounding the areas of small bowel wall thickening.  Degenerative changes in the lumbar spine without acute bony abnormality.  No suspicious bone  lesions.  IMPRESSION: There are two areas of small bowel wall thickening without acute inflammatory changes.  These findings raise concern for neoplastic processes.  Metastatic disease cannot be excluded based on the patient's known right lung mass.  Multiple small low density structures throughout the liver that are too small to definitively characterize.  However, these probably represent small cysts.  Diffuse atherosclerotic disease involving the abdominal aorta.  Nonobstructive left kidney stone.   Original Report Authenticated By: Richarda Overlie, M.D.   Dg Chest Port 1  View  01/14/2013   *RADIOLOGY REPORT*  Clinical Data: 72 year old male with chest pain, bilateral lower extremity pain.  PORTABLE CHEST - 1 VIEW  Comparison: [facility] chest radiograph 01/20/2004.  Findings: Portable AP upright view of the chest at 1741 hours. New contour abnormality in the right paratracheal station with a 47 mm area of mass-like opacity inseparable from the superior right mediastinal contour. More subtle rounded increased right hilar contour abnormality compared to the prior study.  Elsewhere mediastinal contours are stable.  Stable sequelae of CABG.  Partially visible ACDF hardware.  No pneumothorax or pulmonary edema.  No pleural effusion or other confluent pulmonary opacity.  IMPRESSION: New mass-like opacity compatible with right suprahilar / paratracheal soft tissue mass.  Top differential consideration is bronchogenic carcinoma.  Recommend follow-up chest CT (IV contrast preferred).   Original Report Authenticated By: Erskine Speed, M.D.    Microbiology: Recent Results (from the past 240 hour(s))  MRSA PCR SCREENING     Status: None   Collection Time    01/14/13 10:50 PM      Result Value Range Status   MRSA by PCR NEGATIVE  NEGATIVE Final   Comment:            The GeneXpert MRSA Assay (FDA     approved for NASAL specimens     only), is one component of a     comprehensive MRSA colonization      surveillance program. It is not     intended to diagnose MRSA     infection nor to guide or     monitor treatment for     MRSA infections.     Labs: Basic Metabolic Panel:  Recent Labs Lab 01/14/13 1750 01/15/13 0217 01/16/13 0452 01/17/13 0504  NA 138 137 139 140  K 3.7 4.0 3.5 3.5  CL 102 102 103 104  CO2 25 25 25 26   GLUCOSE 97 104* 84 104*  BUN 10 10 10 6   CREATININE 1.18 1.18 1.04 1.04  CALCIUM 9.3 9.4 9.3 9.1   Liver Function Tests:  Recent Labs Lab 01/15/13 0217 01/16/13 0452  AST 16 18  ALT 21 22  ALKPHOS 86 83  BILITOT 0.4 0.6  PROT 6.9 6.5  ALBUMIN 3.3* 3.1*   No results found for this basename: LIPASE, AMYLASE,  in the last 168 hours No results found for this basename: AMMONIA,  in the last 168 hours CBC:  Recent Labs Lab 01/14/13 1750 01/15/13 0704 01/16/13 0452 01/17/13 0504  WBC 7.0 8.0 7.1 7.2  NEUTROABS 4.6  --   --   --   HGB 5.8* 8.9* 8.2* 8.4*  HCT 19.2* 28.0* 25.4* 26.2*  MCV 79.3 81.2 81.2 80.1  PLT 307 343 290 334   Cardiac Enzymes:  Recent Labs Lab 01/14/13 1750 01/14/13 2007 01/15/13 0217 01/15/13 0704  TROPONINI <0.30 <0.30 <0.30 <0.30   BNP: BNP (last 3 results)  Recent Labs  01/14/13 1750  PROBNP 634.6*   CBG: No results found for this basename: GLUCAP,  in the last 168 hours     Signed:  MEMON,JEHANZEB  [facility] 01/18/2013, 12:49 AM

## 2013-01-19 ENCOUNTER — Encounter (HOSPITAL_COMMUNITY): Payer: Self-pay | Admitting: Gastroenterology

## 2013-01-23 ENCOUNTER — Encounter (HOSPITAL_COMMUNITY): Payer: Self-pay | Admitting: Cardiology

## 2013-01-23 ENCOUNTER — Inpatient Hospital Stay (HOSPITAL_COMMUNITY)
Admission: EM | Admit: 2013-01-23 | Discharge: 2013-01-25 | DRG: 378 | Disposition: A | Discharge status: Home / Self Care | Payer: Medicare Other | Attending: Internal Medicine | Admitting: Internal Medicine

## 2013-01-23 DIAGNOSIS — R222 Localized swelling, mass and lump, trunk: Secondary | ICD-10-CM | POA: Diagnosis present

## 2013-01-23 DIAGNOSIS — R42 Dizziness and giddiness: Secondary | ICD-10-CM | POA: Diagnosis present

## 2013-01-23 DIAGNOSIS — Z9089 Acquired absence of other organs: Secondary | ICD-10-CM

## 2013-01-23 DIAGNOSIS — A048 Other specified bacterial intestinal infections: Secondary | ICD-10-CM | POA: Diagnosis present

## 2013-01-23 DIAGNOSIS — F0390 Unspecified dementia without behavioral disturbance: Secondary | ICD-10-CM | POA: Diagnosis present

## 2013-01-23 DIAGNOSIS — I10 Essential (primary) hypertension: Secondary | ICD-10-CM | POA: Diagnosis present

## 2013-01-23 DIAGNOSIS — R195 Other fecal abnormalities: Secondary | ICD-10-CM | POA: Insufficient documentation

## 2013-01-23 DIAGNOSIS — K922 Gastrointestinal hemorrhage, unspecified: Principal | ICD-10-CM | POA: Diagnosis present

## 2013-01-23 DIAGNOSIS — G4733 Obstructive sleep apnea (adult) (pediatric): Secondary | ICD-10-CM | POA: Diagnosis present

## 2013-01-23 DIAGNOSIS — D649 Anemia, unspecified: Secondary | ICD-10-CM

## 2013-01-23 DIAGNOSIS — K6389 Other specified diseases of intestine: Secondary | ICD-10-CM | POA: Diagnosis present

## 2013-01-23 DIAGNOSIS — E785 Hyperlipidemia, unspecified: Secondary | ICD-10-CM

## 2013-01-23 DIAGNOSIS — R918 Other nonspecific abnormal finding of lung field: Secondary | ICD-10-CM

## 2013-01-23 DIAGNOSIS — K625 Hemorrhage of anus and rectum: Secondary | ICD-10-CM

## 2013-01-23 DIAGNOSIS — F05 Delirium due to known physiological condition: Secondary | ICD-10-CM | POA: Diagnosis present

## 2013-01-23 DIAGNOSIS — B9681 Helicobacter pylori [H. pylori] as the cause of diseases classified elsewhere: Secondary | ICD-10-CM | POA: Diagnosis present

## 2013-01-23 DIAGNOSIS — K2940 Chronic atrophic gastritis without bleeding: Secondary | ICD-10-CM | POA: Diagnosis present

## 2013-01-23 DIAGNOSIS — D62 Acute posthemorrhagic anemia: Secondary | ICD-10-CM | POA: Diagnosis present

## 2013-01-23 DIAGNOSIS — I252 Old myocardial infarction: Secondary | ICD-10-CM

## 2013-01-23 DIAGNOSIS — Z87891 Personal history of nicotine dependence: Secondary | ICD-10-CM

## 2013-01-23 DIAGNOSIS — Z79899 Other long term (current) drug therapy: Secondary | ICD-10-CM

## 2013-01-23 DIAGNOSIS — K219 Gastro-esophageal reflux disease without esophagitis: Secondary | ICD-10-CM | POA: Diagnosis present

## 2013-01-23 DIAGNOSIS — I2510 Atherosclerotic heart disease of native coronary artery without angina pectoris: Secondary | ICD-10-CM | POA: Diagnosis present

## 2013-01-23 DIAGNOSIS — Z951 Presence of aortocoronary bypass graft: Secondary | ICD-10-CM

## 2013-01-23 LAB — COMPREHENSIVE METABOLIC PANEL
Alkaline Phosphatase: 76 U/L (ref 39–117)
BUN: 19 mg/dL (ref 6–23)
CO2: 21 mEq/L (ref 19–32)
Chloride: 100 mEq/L (ref 96–112)
Creatinine, Ser: 0.96 mg/dL (ref 0.50–1.35)
GFR calc Af Amer: >90 mL/min (ref >90)
GFR calc non Af Amer: 80 mL/min — ABNORMAL LOW (ref >90)
Glucose, Bld: 111 mg/dL — ABNORMAL HIGH (ref 70–99)
Potassium: 4.1 mEq/L (ref 3.5–5.1)
Total Bilirubin: 0.2 mg/dL — ABNORMAL LOW (ref 0.3–1.2)

## 2013-01-23 LAB — OCCULT BLOOD, POC DEVICE: Fecal Occult Bld: POSITIVE — AB

## 2013-01-23 LAB — CBC
HCT: 26.9 % — ABNORMAL LOW (ref 39.0–52.0)
Hemoglobin: 8.5 g/dL — ABNORMAL LOW (ref 13.0–17.0)
MCV: 78.9 fL (ref 78.0–100.0)
RBC: 3.41 MIL/uL — ABNORMAL LOW (ref 4.22–5.81)
RDW: 17.4 % — ABNORMAL HIGH (ref 11.5–15.5)
WBC: 8.8 10*3/uL (ref 4.0–10.5)

## 2013-01-23 MED ORDER — ACETAMINOPHEN 650 MG RE SUPP: 650.0000 mg | Freq: Four times a day (QID) | RECTAL | Status: DC | PRN

## 2013-01-23 MED ORDER — PANTOPRAZOLE SODIUM 40 MG PO TBEC
80.0000 mg | DELAYED_RELEASE_TABLET | Freq: Every day | ORAL | Status: DC
  Administered 2013-01-23: 80 mg via ORAL
  Filled 2013-01-23: qty 2

## 2013-01-23 MED ORDER — HYDROCORTISONE ACETATE 25 MG RE SUPP
25.0000 mg | Freq: Two times a day (BID) | RECTAL | Status: DC | PRN
  Filled 2013-01-23: qty 1

## 2013-01-23 MED ORDER — METOPROLOL TARTRATE 25 MG PO TABS
25.0000 mg | ORAL_TABLET | Freq: Every evening | ORAL | Status: DC
  Administered 2013-01-23: 25 mg via ORAL
  Filled 2013-01-23 (×2): qty 1

## 2013-01-23 MED ORDER — SODIUM CHLORIDE 0.9 % IJ SOLN
3.0000 mL | Freq: Two times a day (BID) | INTRAMUSCULAR | Status: DC
  Administered 2013-01-23: 3 mL via INTRAVENOUS

## 2013-01-23 MED ORDER — ONDANSETRON HCL 4 MG/2ML IJ SOLN: 4.0000 mg | Freq: Three times a day (TID) | INTRAMUSCULAR | Status: AC | PRN

## 2013-01-23 MED ORDER — SODIUM CHLORIDE 0.9 % IV SOLN
INTRAVENOUS | Status: AC
  Administered 2013-01-23: 22:00:00 via INTRAVENOUS

## 2013-01-23 MED ORDER — ATORVASTATIN CALCIUM 20 MG PO TABS
20.0000 mg | ORAL_TABLET | Freq: Every evening | ORAL | Status: DC
  Administered 2013-01-23 – 2013-01-24 (×2): 20 mg via ORAL
  Filled 2013-01-23 (×3): qty 1

## 2013-01-23 MED ORDER — SODIUM CHLORIDE 0.9 % IV BOLUS (SEPSIS)
500.0000 mL | Freq: Once | INTRAVENOUS | Status: AC
  Administered 2013-01-23: 500 mL via INTRAVENOUS

## 2013-01-23 MED ORDER — ACETAMINOPHEN 325 MG PO TABS
650.0000 mg | ORAL_TABLET | Freq: Four times a day (QID) | ORAL | Status: DC | PRN
  Administered 2013-01-24: 650 mg via ORAL
  Filled 2013-01-23: qty 2

## 2013-01-23 MED ORDER — PE-SHARK LIVER OIL-COCOA BUTTR 0.25-3-85.5 % RE SUPP: 1.0000 | RECTAL | Status: DC | PRN

## 2013-01-23 NOTE — ED Notes (Signed)
Admit Doctor at bedside.  

## 2013-01-23 NOTE — H&P (Signed)
[facility] History and Physical  JED KUTCH UEA:[id number] DOB: [birth year] DOA: 01/23/2013  Referring physician: er PCP: Colon Branch, MD  Specialists:   Chief Complaint: dizziness/dark stools  HPI: Jonathan Watson is a 72 y.o. male  Recently d/c'd from [facility] for dark stools.  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 Normal mucosa in the terminal ileum  2. Normal colon  3. Small internal hemorrhoids  He was to be sent to [facility] for a small bowel enteroscopy but before he could go there, he began to have more dark stools and dizziness.  Orthostatics in ER were negative, not taking Iron. Hgb same as d/c Endorses sleepiness daytime, fatigue -wife states husband has periods of apnea in his sleep but has never been tested  He was also to follow up with Dr. Laneta Simmers for his lung mass.  Review of Systems: all systems reviewed, negative unless stated above   Past Medical History  Diagnosis Date  . MI (myocardial infarction)   . Hypertension   . Hyperlipidemia   . Coronary artery disease    Past Surgical History  Procedure Laterality Date  . Appendectomy    . Coronary artery bypass graft    . Cervical spine surgery    . Esophagogastroduodenoscopy N/A 01/15/2013    Procedure: ESOPHAGOGASTRODUODENOSCOPY (EGD);  Surgeon: West Bali, MD;  Location: [facility];  Service: Endoscopy;  Laterality: N/A;  . Colonoscopy N/A 01/17/2013    Procedure: COLONOSCOPY;  Surgeon: West Bali, MD;  Location: [facility];  Service: Endoscopy;  Laterality: N/A;   Social History:  reports that he has quit smoking. He does not have any smokeless tobacco history on file. He reports that he does not drink alcohol or use illicit drugs.  No Known Allergies  History reviewed. No pertinent family history.   Prior  to Admission medications   Medication Sig Start Date End Date Taking? Authorizing Provider  atorvastatin (LIPITOR) 20 MG tablet Take 20 mg by mouth every evening.   Yes Historical Provider, MD  GARLIC PO Take 1 tablet by mouth every evening.   Yes Historical Provider, MD  metoprolol tartrate (LOPRESSOR) 25 MG tablet Take 25 mg by mouth every evening.   Yes Historical Provider, MD  omeprazole (PRILOSEC) 20 MG capsule Take 20 mg by mouth 2 (two) times daily. 01/17/13  Yes Erick Blinks, MD  shark liver oil-cocoa butter (PREPARATION H) 0.25-3-85.5 % suppository Place 1 suppository rectally as needed for hemorrhoids.   Yes Historical Provider, MD   Physical Exam: Filed Vitals:   01/23/13 1820  BP: 156/61  Pulse: 72  Temp:   Resp: 16    Constitutional: He is oriented to person, place, and time. He appears well-developed and well-nourished. No distress.  HENT:  Normocephalic and atraumatic.  Eyes: Pupils are equal, round, and reactive to light.  Neck: Normal range of motion. Neck supple.  Cardiovascular: Normal rate, regular rhythm and normal heart sounds.  Pulmonary/Chest: Effort normal and breath sounds normal. No respiratory distress. He has no wheezes. He has no rales.  Abdominal: Soft. +BS. He exhibits no distension and no mass. There is no tenderness. There is no rebound and no guarding.  Guaiac positive stool in ER- dark in color; + hemmorhoids Musculoskeletal: Normal range of motion. He exhibits no edema and no tenderness.  Neurological: He  is alert and oriented to person, place, and time.  Skin: Skin is warm and dry.  Psychiatric: He has a normal mood and affect   Labs on Admission:  Basic Metabolic Panel:  Recent Labs Lab 01/17/13 0504 01/23/13 1620  NA 140 134*  K 3.5 4.1  CL 104 100  CO2 26 21  GLUCOSE 104* 111*  BUN 6 19  CREATININE 1.04 0.96  CALCIUM 9.1 9.4   Liver Function Tests:  Recent Labs Lab 01/23/13 1620  AST 17  ALT 19  ALKPHOS 76  BILITOT 0.2*   PROT 7.4  ALBUMIN 3.2*   No results found for this basename: LIPASE, AMYLASE,  in the last 168 hours No results found for this basename: AMMONIA,  in the last 168 hours CBC:  Recent Labs Lab 01/17/13 0504 01/23/13 1620  WBC 7.2 8.8  HGB 8.4* 8.5*  HCT 26.2* 26.9*  MCV 80.1 78.9  PLT 334 321   Cardiac Enzymes: No results found for this basename: CKTOTAL, CKMB, CKMBINDEX, TROPONINI,  in the last 168 hours  BNP (last 3 results)  Recent Labs  01/14/13 1750  PROBNP 634.6*   CBG: No results found for this basename: GLUCAP,  in the last 168 hours  Radiological Exams on Admission: No results found.    Assessment/Plan Principal Problem:   Anemia Active Problems:   Lung mass   GERD (gastroesophageal reflux disease)   Dark stools   Dizziness   1. GI bleed: + dark stool, recent EGD, coloscopy- next step is a capsule endoscopy per records; paged GI and at time of note awaiting return page ([email or web address]) to see if this can be done here.  NPO after midnight , CBC in AM 2. Dizziness- on BB; watch on tele, ? bradycardia 3. prob OSA- wife describes periods of apnea while patient sleeps, + daytime sleepiness- outpatient study 4. Lung mass- to follow up with Dr. Laneta Simmers     Code Status: full Family Communication: patient/wife/daughter Disposition Plan: obs  Time spent: 70 min  Marlin Canary [facility] Pager [phone]  If 7PM-7AM, please contact night-coverage [email or web address] Password [email or web address] 01/23/2013, 6:47 PM

## 2013-01-23 NOTE — Progress Notes (Addendum)
Jonathan Watson [id number] Admitted to 4U98: 01/23/2013 8:00 PM Attending Provider: Joseph Art, DO    Jonathan Watson is a 72 y.o. male patient admitted from ED awake, alert  & orientated  X 3,  Full Code, VSS - Blood pressure 134/67, pulse 63, temperature 98.1 F (36.7 C), temperature source Oral, resp. rate 18, height 5\' 7"  (1.702 m), weight 75.8 kg (167 lb 1.7 oz), SpO2 99.00%.RA, no c/o shortness of breath, no c/o chest pain, no distress noted. Tele # [id number] placed and pt is currently running: Junctional Rhythm   IV site WDL:  with a transparent dsg that's clean dry and intact.  Allergies:  No Known Allergies   Past Medical History  Diagnosis Date  . MI (myocardial infarction)   . Hypertension   . Hyperlipidemia   . Coronary artery disease     History:  obtained from patient  Pt orientation to unit, room and routine. Information packet given to patient and safety video refused.  Admission INP armband ID verified with patient, and in place. SR up x 2, fall risk assessment complete with Patient verbalizing understanding of risks associated with falls. Pt verbalizes an understanding of how to use the call bell and to call for help before getting out of bed.  Skin, clean-dry- intact without evidence of bruising, or skin tears.   No evidence of skin break down noted on exam.  Will cont to monitor and assist as needed.  Elisha Ponder, RN 01/23/2013 8:00 PM

## 2013-01-23 NOTE — ED Notes (Signed)
Pt reports that over the past month he has noticed dark stools. States that he also feels like he has no energy. States that he was seen at [facility] about a week ago and things were good. Reports that he is feeling bad again this morning.

## 2013-01-23 NOTE — ED Provider Notes (Signed)
CSN: [id number]     Arrival date & time 01/23/13  1355 History     First MD Initiated Contact with Patient 01/23/13 1610     Chief Complaint  Patient presents with  . Rectal Bleeding   (Consider location/radiation/quality/duration/timing/severity/associated sxs/prior Treatment) Patient is a 72 y.o. male presenting with hematochezia. The history is provided by the patient, the spouse and a relative. No language interpreter was used.  Rectal Bleeding Quality:  Black and tarry Amount:  Moderate Duration:  2 days Timing:  Intermittent Progression:  Unchanged Chronicity:  Recurrent Context: spontaneously   Similar prior episodes: yes   Relieved by:  Nothing Worsened by:  Nothing tried Ineffective treatments:  None tried Associated symptoms: dizziness and light-headedness   Associated symptoms: no abdominal pain, no fever, no loss of consciousness, no recent illness and no vomiting   Risk factors: no anticoagulant use     Past Medical History  Diagnosis Date  . MI (myocardial infarction)   . Hypertension   . Hyperlipidemia   . Coronary artery disease    Past Surgical History  Procedure Laterality Date  . Coronary artery bypass graft    . Cervical spine surgery    . Esophagogastroduodenoscopy N/A 01/15/2013    Procedure: ESOPHAGOGASTRODUODENOSCOPY (EGD);  Surgeon: West Bali, MD;  Location: [facility];  Service: Endoscopy;  Laterality: N/A;  . Colonoscopy N/A 01/17/2013    Procedure: COLONOSCOPY;  Surgeon: West Bali, MD;  Location: [facility];  Service: Endoscopy;  Laterality: N/A;   History reviewed. No pertinent family history. History  Substance Use Topics  . Smoking status: Former Smoker -- 1.50 packs/day for 25 years  . Smokeless tobacco: Not on file  . Alcohol Use: No    Review of Systems  Constitutional: Negative for fever, activity change, appetite change and fatigue.  HENT: Negative for congestion, facial swelling, rhinorrhea and trouble  swallowing.   Eyes: Negative for photophobia and pain.  Respiratory: Negative for cough, chest tightness and shortness of breath.   Cardiovascular: Negative for chest pain and leg swelling.  Gastrointestinal: Positive for blood in stool and hematochezia. Negative for nausea, vomiting, abdominal pain, diarrhea and constipation.  Endocrine: Negative for polydipsia and polyuria.  Genitourinary: Negative for dysuria, urgency, decreased urine volume and difficulty urinating.  Musculoskeletal: Negative for back pain and gait problem.  Skin: Negative for color change, rash and wound.  Allergic/Immunologic: Negative for immunocompromised state.  Neurological: Positive for dizziness and light-headedness. Negative for loss of consciousness, facial asymmetry, speech difficulty, weakness, numbness and headaches.  Psychiatric/Behavioral: Negative for confusion, decreased concentration and agitation.    Allergies  Review of patient's allergies indicates no known allergies.  Home Medications   No current outpatient prescriptions on file. BP 134/67  Pulse 63  Temp(Src) 98.1 F (36.7 C) (Oral)  Resp 18  Ht 5\' 7"  (1.702 m)  Wt 167 lb 1.7 oz (75.8 kg)  BMI 26.17 kg/m2  SpO2 99% Physical Exam  Constitutional: He is oriented to person, place, and time. He appears well-developed and well-nourished. No distress.  HENT:  Head: Normocephalic and atraumatic.  Mouth/Throat: No oropharyngeal exudate.  Eyes: Pupils are equal, round, and reactive to light.  Neck: Normal range of motion. Neck supple.  Cardiovascular: Normal rate, regular rhythm and normal heart sounds.  Exam reveals no gallop and no friction rub.   No murmur heard. Pulmonary/Chest: Effort normal and breath sounds normal. No respiratory distress. He has no wheezes. He has no rales.  Abdominal: Soft. Bowel  sounds are normal. He exhibits no distension and no mass. There is no tenderness. There is no rebound and no guarding.  Genitourinary:  Guaiac positive stool.  Dark, heme+ stool   Musculoskeletal: Normal range of motion. He exhibits no edema and no tenderness.  Neurological: He is alert and oriented to person, place, and time.  Skin: Skin is warm and dry. There is pallor.  Psychiatric: He has a normal mood and affect.    ED Course   Procedures (including critical care time)  Labs Reviewed  CBC - Abnormal; Notable for the following:    RBC 3.41 (*)    Hemoglobin 8.5 (*)    HCT 26.9 (*)    MCH 24.9 (*)    RDW 17.4 (*)    All other components within normal limits  COMPREHENSIVE METABOLIC PANEL - Abnormal; Notable for the following:    Sodium 134 (*)    Glucose, Bld 111 (*)    Albumin 3.2 (*)    Total Bilirubin 0.2 (*)    GFR calc non Af Amer 80 (*)    All other components within normal limits  OCCULT BLOOD, POC DEVICE - Abnormal; Notable for the following:    Fecal Occult Bld POSITIVE (*)    All other components within normal limits  CBC  BASIC METABOLIC PANEL   No results found. 1. Rectal bleeding   2. Anemia   3. Dark stools   4. Dizziness     MDM  Pt is a 72 y.o. male with Pmhx as above who presents with two episodes of dark, tarry stool since yesterday and positional lightheadedness/dizziness since this morning.  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  Hb stable today, but pt symptomatic when standing per family.  [facility] consulted for admission for obs given concern for continued bleeding.   1. Rectal bleeding   2. Anemia   3. Dark stools   4. Dizziness       Shanna Cisco, MD 01/24/13 [phone]

## 2013-01-24 ENCOUNTER — Encounter (HOSPITAL_COMMUNITY): Payer: Self-pay | Admitting: General Surgery

## 2013-01-24 ENCOUNTER — Telehealth: Payer: Self-pay | Admitting: Gastroenterology

## 2013-01-24 DIAGNOSIS — F0390 Unspecified dementia without behavioral disturbance: Secondary | ICD-10-CM

## 2013-01-24 DIAGNOSIS — B9681 Helicobacter pylori [H. pylori] as the cause of diseases classified elsewhere: Secondary | ICD-10-CM | POA: Diagnosis present

## 2013-01-24 DIAGNOSIS — D62 Acute posthemorrhagic anemia: Secondary | ICD-10-CM

## 2013-01-24 DIAGNOSIS — K6389 Other specified diseases of intestine: Secondary | ICD-10-CM

## 2013-01-24 DIAGNOSIS — E785 Hyperlipidemia, unspecified: Secondary | ICD-10-CM

## 2013-01-24 DIAGNOSIS — R222 Localized swelling, mass and lump, trunk: Secondary | ICD-10-CM

## 2013-01-24 DIAGNOSIS — K922 Gastrointestinal hemorrhage, unspecified: Secondary | ICD-10-CM

## 2013-01-24 DIAGNOSIS — I10 Essential (primary) hypertension: Secondary | ICD-10-CM | POA: Diagnosis present

## 2013-01-24 HISTORY — DX: Unspecified dementia, unspecified severity, without behavioral disturbance, psychotic disturbance, mood disturbance, and anxiety: F03.90

## 2013-01-24 LAB — BASIC METABOLIC PANEL
CO2: 25 mEq/L (ref 19–32)
Calcium: 8.9 mg/dL (ref 8.4–10.5)
GFR calc non Af Amer: 66 mL/min — ABNORMAL LOW (ref >90)
Potassium: 4.1 mEq/L (ref 3.5–5.1)
Sodium: 137 mEq/L (ref 135–145)

## 2013-01-24 LAB — CBC
MCH: 24.2 pg — ABNORMAL LOW (ref 26.0–34.0)
MCHC: 30.7 g/dL (ref 30.0–36.0)
Platelets: 296 10*3/uL (ref 150–400)
RBC: 2.69 MIL/uL — ABNORMAL LOW (ref 4.22–5.81)

## 2013-01-24 LAB — HEMOGLOBIN AND HEMATOCRIT, BLOOD
HCT: 25.6 % — ABNORMAL LOW (ref 39.0–52.0)
Hemoglobin: 8.2 g/dL — ABNORMAL LOW (ref 13.0–17.0)

## 2013-01-24 LAB — PREPARE RBC (CROSSMATCH)

## 2013-01-24 MED ORDER — CLARITHROMYCIN 500 MG PO TABS: ORAL_TABLET | ORAL | Status: DC

## 2013-01-24 MED ORDER — AMOXICILLIN 500 MG PO TABS: ORAL_TABLET | ORAL | Status: DC

## 2013-01-24 MED ORDER — PANTOPRAZOLE SODIUM 40 MG PO TBEC
40.0000 mg | DELAYED_RELEASE_TABLET | Freq: Two times a day (BID) | ORAL | Status: DC
  Administered 2013-01-24 – 2013-01-25 (×3): 40 mg via ORAL
  Filled 2013-01-24 (×3): qty 1

## 2013-01-24 NOTE — Consult Note (Signed)
I have seen and examined the pt and agree with PA-Osborne's progress note. GI plans for capsule endoscopy. I believe the pt is planning on staying, no mention of going home. Will follow.

## 2013-01-24 NOTE — Progress Notes (Addendum)
[facility] PROGRESS NOTE  Jonathan Watson YQM:[id number] DOB: 07-03-1940 DOA: 01/23/2013 PCP: Colon Branch, MD  Assessment/Plan:    GI bleed:  Had EGD and colonoscopy a week ago.  No bleed found. Mild gastritis, small internal hemmoroids.  CT abd pelvis showed 2 areas in small bowel concerning for neoplasm.  susect these are the source.  Discussed with Dr. Matthias Hughs.  He has discussed the case with Dr. Gwinda Passe at [facility]. He recommends surgical consult to consider resection, at least laparoscopy. Patient has had continued bleeding, and push enteroscopy at [facility] would not be helpful to stop bleeding, only identify the abnormal lesion. Dr. Matthias Hughs will also review the films with radiology and consult. I have called surgery to consult. Will keep patient n.p.o. until plan clarified and consultants weigh in. also, gastric biopsy showed H. pylori which has not yet been treated. Defer treatment for now.     Acute blood loss anemia: Will transfuse a unit of blood. Per family, stool has remained dark. Serial H&H's. Has remained hemodynamically stable.    Small bowel mass: See above    Lung mass: Has followup plans with Dr. Laneta Simmers for outpatient evaluation.    GERD (gastroesophageal reflux disease)    Dizziness: Secondary to anemia    Dementia: Family has been remaining with the patient as he frequently tries to get out of bed on his own. They report that he became more confused with sedation at [facility] and request that it not be given on the unit here if possible.    Essential hypertension, benign    Other and unspecified hyperlipidemia  Code Status: Full Family Communication: Son and wife at bedside Disposition Plan: Home once medically stable  Consultants:  Buccini  CCS  Procedures:    Antibiotics:    HPI/Subjective: No complaints. Family reports that his stools remain black.  Objective: Filed Vitals:   01/24/13 0500  BP: 157/62  Pulse: 67  Temp: 97.9 F (36.6  C)  Resp: 20    Intake/Output Summary (Last 24 hours) at 01/24/13 0856 Last data filed at 01/24/13 0800  Gross per 24 hour  Intake      0 ml  Output    800 ml  Net   -800 ml   Filed Weights   01/23/13 1957  Weight: 75.8 kg (167 lb 1.7 oz)    Exam:   General:  Alert. Cooperative but remains confused  Cardiovascular: Regular rate rhythm without murmurs gallops rubs  Respiratory: Clear to auscultation bilaterally without wheeze rhonchi or rale  Abdomen: Soft nontender nondistended  Extremities: No clubbing cyanosis or edema  Psychiatric: Appears somewhat anxious. Cooperative. Not combative.  Data Reviewed: Basic Metabolic Panel:  Recent Labs Lab 01/23/13 1620 01/24/13 0555  NA 134* 137  K 4.1 4.1  CL 100 105  CO2 21 25  GLUCOSE 111* 94  BUN 19 17  CREATININE 0.96 1.08  CALCIUM 9.4 8.9   Liver Function Tests:  Recent Labs Lab 01/23/13 1620  AST 17  ALT 19  ALKPHOS 76  BILITOT 0.2*  PROT 7.4  ALBUMIN 3.2*   No results found for this basename: LIPASE, AMYLASE,  in the last 168 hours No results found for this basename: AMMONIA,  in the last 168 hours CBC:  Recent Labs Lab 01/23/13 1620 01/24/13 0555  WBC 8.8 7.2  HGB 8.5* 6.5*  HCT 26.9* 21.2*  MCV 78.9 78.8  PLT 321 296   Cardiac Enzymes: No results found for this basename: CKTOTAL, CKMB, CKMBINDEX,  TROPONINI,  in the last 168 hours BNP (last 3 results)  Recent Labs  01/14/13 1750  PROBNP 634.6*   CBG: No results found for this basename: GLUCAP,  in the last 168 hours  Recent Results (from the past 240 hour(s))  MRSA PCR SCREENING     Status: None   Collection Time    01/14/13 10:50 PM      Result Value Range Status   MRSA by PCR NEGATIVE  NEGATIVE Final   Comment:            The GeneXpert MRSA Assay (FDA     approved for NASAL specimens     only), is one component of a     comprehensive MRSA colonization     surveillance program. It is not     intended to diagnose MRSA      infection nor to guide or     monitor treatment for     MRSA infections.     Studies: No results found.  Scheduled Meds: . sodium chloride   Intravenous STAT  . atorvastatin  20 mg Oral QPM  . metoprolol tartrate  25 mg Oral QPM  . pantoprazole  80 mg Oral Daily  . sodium chloride  3 mL Intravenous Q12H   Continuous Infusions:   Time spent: 45 minutes  Shanequia Kendrick L  [facility] Pager [phone]. If 7PM-7AM, please contact night-coverage at [email or web address], password [email or web address] 01/24/2013, 8:56 AM  LOS: 1 day

## 2013-01-24 NOTE — Progress Notes (Signed)
CRITICAL VALUE ALERT  Critical value received:  Hgb 6.5   Date of notification:  01/24/13  Time of notification:  06:56  Critical value read back:yes  Nurse who received alert:  Kinsley Holderman, Joan Mayans  MD notified (1st page):  M. York PA  Time of first page:  06:56  MD notified (2nd page):  Time of second page:  Responding MD:  York PA  Time MD responded:  06:57  York, PA stated she would inform am rounding team.  Will continue to monitor patient.

## 2013-01-24 NOTE — Progress Notes (Signed)
INITIAL NUTRITION ASSESSMENT  DOCUMENTATION CODES Per approved criteria  -Not Applicable   INTERVENTION: Recommend Ensure Complete po BID, each supplement provides 350 kcal and 13 grams of protein, once diet allows. RD to continue to follow nutrition care plan.  NUTRITION DIAGNOSIS: Inadequate oral intake related to inability to eat as evidenced by NPO status.   Goal: Diet advancement as tolerated. Intake to meet at least 90% of estimated needs.  Monitor:  Diet advancement, weights, labs, I/O's, PO intake, need for oral nutrition supplements  Reason for Assessment: Malnutrition Screening Tool  72 y.o. male  Admitting Dx: GI bleed  ASSESSMENT: PMHx significant for MI, lung mass (current), and HTN. Admitted with dark stools, dizziness and low energy. Recently discharged from [facility] for dark stools. 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 Work-up reveals that pt now has GIB.   CT reveals two areas in small bowel concerning for neoplasms. Surgery evaluated pt this morning, recommending GI evaluation to consider capsule endoscopy to further evaluate the areas prior to surgery.  Pt reports variable appetite x a couple of years. States that two years ago he weighed 200 lb but is now down to 167 lb per our records, however pt states that he currently weighs 180 lb. Pt states that after he left [facility] he was eating well. Notes that he has lost weight in his abdominal area but doesn't endorse weakness in any particular area in his body.  Nutrition Focused Physical Exam:  Subcutaneous Fat:  Orbital Region: WNL Upper Arm Region: WNL Thoracic and Lumbar Region: N/A  Muscle:  Temple Region: moderate depletion Clavicle Bone Region: WNL Clavicle and Acromion Bone Region: WNL Scapular Bone Region: N/A Dorsal Hand: N/A Patellar Region: WNL Anterior Thigh Region: WNL Posterior Calf Region: WNL  Edema:  none  Pt is at nutrition risk given moderate muscle wasting, declining weights and variable oral intake. Unable to dx with malnutrition at this time. Does drink Ensure at home, per wife. Would benefit from receiving while admitted to help meet kcal/protein needs. Remains NPO at this time.   Height: Ht Readings from Last 1 Encounters:  01/23/13 5\' 7"  (1.702 m)    Weight: Wt Readings from Last 1 Encounters:  01/23/13 167 lb 1.7 oz (75.8 kg)    Ideal Body Weight: 148 lb/67.3 kg  % Ideal Body Weight: 113%  Wt Readings from Last 10 Encounters:  01/23/13 167 lb 1.7 oz (75.8 kg)  01/17/13 170 lb 13.7 oz (77.5 kg)  01/17/13 170 lb 13.7 oz (77.5 kg)  01/17/13 170 lb 13.7 oz (77.5 kg)    Usual Body Weight: 200 lb (per pt) - two years ago  % Usual Body Weight: 83.5%  BMI:  Body mass index is 26.17 kg/(m^2). Overweight  Estimated Nutritional Needs: Kcal: 1500 - 1700 Protein: 75 - 90 g Fluid: 1.5 - 1.7 liters daily  Skin: intact  Diet Order: NPO  EDUCATION NEEDS: -No education needs identified at this time   Intake/Output Summary (Last 24 hours) at 01/24/13 1111 Last data filed at 01/24/13 0800  Gross per 24 hour  Intake      0 ml  Output    800 ml  Net   -800 ml    Last BM: 8/25  Labs:   Recent Labs Lab 01/23/13 1620 01/24/13 0555  NA 134* 137  K 4.1 4.1  CL 100 105  CO2 21 25  BUN 19 17  CREATININE 0.96 1.08  CALCIUM 9.4 8.9  GLUCOSE 111* 94    CBG (last 3)  No results found for this basename: GLUCAP,  in the last 72 hours  Scheduled Meds: . atorvastatin  20 mg Oral QPM  . pantoprazole  40 mg Oral BID  . sodium chloride  3 mL Intravenous Q12H    Continuous Infusions:   Past Medical History  Diagnosis Date  . MI (myocardial infarction)   . Hypertension   . Hyperlipidemia   . Coronary artery disease     Past Surgical History  Procedure Laterality Date  . Coronary artery bypass graft    . Cervical spine surgery    .  Esophagogastroduodenoscopy N/A 01/15/2013    Procedure: ESOPHAGOGASTRODUODENOSCOPY (EGD);  Surgeon: West Bali, MD;  Location: [facility];  Service: Endoscopy;  Laterality: N/A;  . Colonoscopy N/A 01/17/2013    Procedure: COLONOSCOPY;  Surgeon: West Bali, MD;  Location: [facility];  Service: Endoscopy;  Laterality: N/A;    Jarold Motto MS, RD, LDN Pager: [phone] After-hours pager: [phone]

## 2013-01-24 NOTE — Progress Notes (Signed)
MD text paged that patient was in junctional rhythm earlier.  This nurse could not find any history of this before.  He is now in NSR-SB.  No new orders given.  Will continue to monitor patient.

## 2013-01-24 NOTE — Telephone Encounter (Signed)
PLEASE CALL PT'S WIFE. HIS stomach Bx showed H. Pylori infection. He needs AMOXICILLIN 500 mg 2 po BID for 10 days and Biaxin 500 mg po bid for 10 days. TAKE OMEPRAZOLE BID FOR 3 MOS THEN ONCE DAILY. Med side effects include NVD, abd pain, and metallic taste. HE SHOULD NOT TAKE LIPITOR WITH THE ABX. HE SHOULD CONTACT ME IF HE CHANGES HIS MIND ABOUT HAVING SURGERY.

## 2013-01-24 NOTE — Consult Note (Signed)
Jonathan Watson [birth year]  [id number].   Primary Care MD: none Requesting MD: Dr. Crista Curb Chief Complaint/Reason for Consult: GI bleed, ? Small bowel tumors HPI: This is a 72 yo male who for the last month has been having dizziness, SOB, weight loss, and melanotic stools.  He initially didn't think anything of it, but as his dizziness worsened he went to [facility] last week where he was found to have a hgb of 5.  He was admitted and transfused pRBCs.  He had an upper and lower scope done while there that had no significant findings.  He also had a CT scan that reveals a right lobe lung mass as well as two areas in the small bowel with small bowel thickening, concerning for masses.  The patient denies fevers, chills, chest pain, cough, swelling.  He admits to occasional difficulty starting and maintaining his urine stream.  Otherwise he has had a poor appetite for a couple of years, but just started with a 20lb unintentional weight loss 3 months ago.  The patient was readmitted, this time to [facility], yesterday and has had several melanotic stools already today with a drop in hgb from 8.5 to 6.5 today.  We have been asked to see him for possible surgical resection of small bowel lesions.  Review of systems: Please see HPI, otherwise all other systems have been reviewed and are negative  History reviewed. No pertinent family history.  Past Medical History  Diagnosis Date  . MI (myocardial infarction)   . Hypertension   . Hyperlipidemia   . Coronary artery disease     Past Surgical History  Procedure Laterality Date  . Coronary artery bypass graft    . Cervical spine surgery    . Esophagogastroduodenoscopy N/A 01/15/2013    Procedure: ESOPHAGOGASTRODUODENOSCOPY (EGD);  Surgeon: West Bali, MD;  [facility];  Service: Endoscopy;  Laterality: N/A;  . Colonoscopy N/A 01/17/2013    Procedure: COLONOSCOPY;  Surgeon: West Bali, MD;  [facility];  Service: Endoscopy;   Laterality: N/A;    Social History:  reports that he has quit smoking. He does not have any smokeless tobacco history on file. He reports that he does not drink alcohol or use illicit drugs.  Allergies: No Known Allergies  Medications Prior to Admission  Medication Sig Dispense Refill  . atorvastatin (LIPITOR) 20 MG tablet Take 20 mg by mouth every evening.      Marland Kitchen GARLIC PO Take 1 tablet by mouth every evening.      . metoprolol tartrate (LOPRESSOR) 25 MG tablet Take 25 mg by mouth every evening.      Marland Kitchen omeprazole (PRILOSEC) 20 MG capsule Take 20 mg by mouth 2 (two) times daily.      . shark liver oil-cocoa butter (PREPARATION H) 0.25-3-85.5 % suppository Place 1 suppository rectally as needed for hemorrhoids.        Blood pressure 157/62, pulse 67, temperature 97.9 F (36.6 C), temperature source Oral, resp. rate 20, height 5\' 7"  (1.702 m), weight 167 lb 1.7 oz (75.8 kg), SpO2 99.00%. Physical Exam: General: pleasant, WD, WN white male who is laying in bed in NAD HEENT: head is normocephalic, atraumatic.  Sclera are noninjected.  PERRL.  Ears and nose without any masses or lesions.  Mouth is pink and moist Heart: regular, rate, and rhythm.  Normal s1,s2. No obvious murmurs, gallops, or rubs noted.  Palpable radial and pedal pulses bilaterally Lungs: CTAB, no wheezes, rhonchi, or  rales noted.  Respiratory effort nonlabored Abd: soft, NT, ND, +BS, no masses, hernias, or organomegaly MS: all 4 extremities are symmetrical with no cyanosis, clubbing, or edema. Skin: warm and dry with no masses, lesions, or rashes Psych: A&Ox3 with a flat affect    Results for orders placed during the hospital encounter of 01/23/13 (from the past 48 hour(s))  CBC     Status: Abnormal   Collection Time    01/23/13  4:20 PM      Result Value Range   WBC 8.8  4.0 - 10.5 K/uL   RBC 3.41 (*) 4.22 - 5.81 MIL/uL   Hemoglobin 8.5 (*) 13.0 - 17.0 g/dL   HCT 16.1 (*) 09.6 - 04.5 %   MCV 78.9  78.0 - 100.0 fL    MCH 24.9 (*) 26.0 - 34.0 pg   MCHC 31.6  30.0 - 36.0 g/dL   RDW 40.9 (*) 81.1 - 91.4 %   Platelets 321  150 - 400 K/uL  COMPREHENSIVE METABOLIC PANEL     Status: Abnormal   Collection Time    01/23/13  4:20 PM      Result Value Range   Sodium 134 (*) 135 - 145 mEq/L   Potassium 4.1  3.5 - 5.1 mEq/L   Chloride 100  96 - 112 mEq/L   CO2 21  19 - 32 mEq/L   Glucose, Bld 111 (*) 70 - 99 mg/dL   BUN 19  6 - 23 mg/dL   Creatinine, Ser 7.82  0.50 - 1.35 mg/dL   Calcium 9.4  8.4 - 95.6 mg/dL   Total Protein 7.4  6.0 - 8.3 g/dL   Albumin 3.2 (*) 3.5 - 5.2 g/dL   AST 17  0 - 37 U/L   ALT 19  0 - 53 U/L   Alkaline Phosphatase 76  39 - 117 U/L   Total Bilirubin 0.2 (*) 0.3 - 1.2 mg/dL   GFR calc non Af Amer 80 (*) >90 mL/min   GFR calc Af Amer >90  >90 mL/min   Comment: (NOTE)     The eGFR has been calculated using the CKD EPI equation.     This calculation has not been validated in all clinical situations.     eGFR's persistently <90 mL/min signify possible Chronic Kidney     Disease.  OCCULT BLOOD, POC DEVICE     Status: Abnormal   Collection Time    01/23/13  5:30 PM      Result Value Range   Fecal Occult Bld POSITIVE (*) NEGATIVE  CBC     Status: Abnormal   Collection Time    01/24/13  5:55 AM      Result Value Range   WBC 7.2  4.0 - 10.5 K/uL   RBC 2.69 (*) 4.22 - 5.81 MIL/uL   Hemoglobin 6.5 (*) 13.0 - 17.0 g/dL   Comment: REPEATED TO VERIFY     CRITICAL RESULT CALLED TO, READ BACK BY AND VERIFIED WITH:     N FAUCETTE,RN AT 2130 01/24/13 BY K BARR   HCT 21.2 (*) 39.0 - 52.0 %   MCV 78.8  78.0 - 100.0 fL   MCH 24.2 (*) 26.0 - 34.0 pg   MCHC 30.7  30.0 - 36.0 g/dL   RDW 86.5 (*) 78.4 - 69.6 %   Platelets 296  150 - 400 K/uL  BASIC METABOLIC PANEL     Status: Abnormal   Collection Time    01/24/13  5:55 AM  Result Value Range   Sodium 137  135 - 145 mEq/L   Potassium 4.1  3.5 - 5.1 mEq/L   Chloride 105  96 - 112 mEq/L   CO2 25  19 - 32 mEq/L   Glucose, Bld 94   70 - 99 mg/dL   BUN 17  6 - 23 mg/dL   Creatinine, Ser 1.61  0.50 - 1.35 mg/dL   Calcium 8.9  8.4 - 09.6 mg/dL   GFR calc non Af Amer 66 (*) >90 mL/min   GFR calc Af Amer 77 (*) >90 mL/min   Comment: (NOTE)     The eGFR has been calculated using the CKD EPI equation.     This calculation has not been validated in all clinical situations.     eGFR's persistently <90 mL/min signify possible Chronic Kidney     Disease.   No results found.     Assessment/Plan 1. Melanotic stools secondary to upper GI bleed 2. Small bowel wall thickening x 2 areas, ? Masses 3. Right lung mass 4. H/o tobacco use, quit 20 yrs ago 5. CAD, s/p CABG 6. HTN  Plan: 1. Imaging reviewed with Dr. Derrell Lolling.  He does have thickening of his small bowel in 2 different areas, these cannot be rule out as masses, but that's also not a definite.  It is possible that his bleeding is coming from one or both of these sites; however, we cannot be sure that he doesn't have some AVMs or another source of bleeding in his small bowel.  We would recommend resuscitation and transfusion as needed.  We would also recommend GI evaluation to consider capsule endoscopy to further evaluate the small bowel and in particular these two areas.  We will follow along.  Damyiah Moxley E 01/24/2013, 9:54 AM Pager: [phone]

## 2013-01-24 NOTE — Progress Notes (Signed)
Spoke w/ pt's nurse--pt now seems agreeable to staying in the hospital.  Have asked nurse to verify he's ok w/ idea of capsule endoscopy (which I discussed w/ pt and wife in the room when I was by earlier today) and to call on-call doctor if he's not agreeable.  Called his wife at home and she is agreeable to proceeding w/ capsule endoscopy in a.m.  Slight risk of retained capsule, w/ possible need for surgical removal, discussed.  Have made pt NPO after midnight, and have notified Endo on-call nurse of need to do the study tomorrow.  Florencia Reasons, M.D. [phone]

## 2013-01-24 NOTE — Consult Note (Signed)
Referring Provider: Dr. Crista Curb ([facility]) Primary Care Physician:  Colon Branch, MD Primary Gastroenterologist:  (None) (scoped recently by Dr. Raj Janus)  Reason for Consultation:  GI bleeding of unclear cause, radiographic abnormality of the small bowel  HPI: Jonathan Watson is a 72 y.o. male admitted about 10 days ago to [facility] in [place] because of severe dyspnea and found to be severely anemic on that occasion, with a history of dark stools shortly prior to that, and a low hemoglobin of 5.8 (MCV 79) at time of admission, with normal BUN.   Endoscopy was normal except for a small gastric AVM not felt to be responsible for his profound anemia, and colonoscopy was negative. A CT scan showed at least 2 foci of small bowel abnormality, with bowel wall thickening and an exophytic component in one of the 2 areas. I have reviewed those films with the radiologist, and it appears that the proximal lesion is jejunal in location, the latter probably ileal.   He was discharged with the intent of small bowel enteroscopy at [facility], but before that could be arranged, within about 6 days of hospital discharge, he re-bled and therefore was readmitted to the hospital yesterday here at [facility].   Since admission, he has had approximately 4 dark stools and has dropped his hemoglobin 2 g in 24 hours.   Note that the patient has apparently been having memory problems for some time, from review of the record on his prior hospitalization, and apparently he was quite confused (from discussion with his nurse) earlier today. At this time, he seems fairly rational, but his wife does most of the talking.   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 That was an incidental finding during his evaluation at the previous hospital. It has not yet been evaluated.  During a surgical  consultation earlier today, a 20 pound weight loss was noted. However, from talking with both the patient and his wife during my visit, I obtain no history of weight loss. His only GI symptom seems to be constipation, which is a chronic, long-standing problem for him. No history of reflux, dysphagia, abdominal pain, nausea, etc.    Past Medical History  Diagnosis Date  . MI (myocardial infarction)   . Hypertension   . Hyperlipidemia   . Coronary artery disease     Past Surgical History  Procedure Laterality Date  . Coronary artery bypass graft    . Cervical spine surgery    . Esophagogastroduodenoscopy N/A 01/15/2013    Procedure: ESOPHAGOGASTRODUODENOSCOPY (EGD);  Surgeon: West Bali, MD;  Location: [facility];  Service: Endoscopy;  Laterality: N/A;  . Colonoscopy N/A 01/17/2013    Procedure: COLONOSCOPY;  Surgeon: West Bali, MD;  Location: [facility];  Service: Endoscopy;  Laterality: N/A;    Prior to Admission medications   Medication Sig Start Date End Date Taking? Authorizing Provider  atorvastatin (LIPITOR) 20 MG tablet Take 20 mg by mouth every evening.   Yes Historical Provider, MD  GARLIC PO Take 1 tablet by mouth every evening.   Yes Historical Provider, MD  metoprolol tartrate (LOPRESSOR) 25 MG tablet Take 25 mg by mouth every evening.   Yes Historical Provider, MD  omeprazole (PRILOSEC) 20 MG capsule Take 20 mg by mouth 2 (two) times daily. 01/17/13  Yes Erick Blinks, MD  shark liver oil-cocoa butter (PREPARATION H) 0.25-3-85.5 %  suppository Place 1 suppository rectally as needed for hemorrhoids.   Yes Historical Provider, MD    Current Facility-Administered Medications  Medication Dose Route Frequency Provider Last Rate Last Dose  . acetaminophen (TYLENOL) tablet 650 mg  650 mg Oral Q6H PRN Joseph Art, DO   650 mg at 01/24/13 0419   Or  . acetaminophen (TYLENOL) suppository 650 mg  650 mg Rectal Q6H PRN Joseph Art, DO      . atorvastatin  (LIPITOR) tablet 20 mg  20 mg Oral QPM Joseph Art, DO   20 mg at 01/23/13 2209  . hydrocortisone (ANUSOL-HC) suppository 25 mg  25 mg Rectal BID PRN Joseph Art, DO      . pantoprazole (PROTONIX) EC tablet 40 mg  40 mg Oral BID Christiane Ha, MD   40 mg at 01/24/13 1048  . sodium chloride 0.9 % injection 3 mL  3 mL Intravenous Q12H Joseph Art, DO   3 mL at 01/23/13 2210    Allergies as of 01/23/2013  . (No Known Allergies)    History reviewed. No pertinent family history.  History   Social History  . Marital Status: Married    Spouse Name: N/A    Number of Children: N/A  . Years of Education: N/A   Occupational History  . Not on file.   Social History Main Topics  . Smoking status: Former Smoker -- 1.50 packs/day for 25 years  . Smokeless tobacco: Not on file  . Alcohol Use: No  . Drug Use: No  . Sexual Activity: Yes   Other Topics Concern  . Not on file   Social History Narrative  . No narrative on file    Review of Systems: Positive for: Chronic constipation, possibly some weight loss. Also, severe fatigue with dyspnea on exertion, and dark stools as noted. Apparently having recent cognitive deficits.  Physical Exam: Vital signs in last 24 hours: Temp:  [97.8 F (36.6 C)-98.7 F (37.1 C)] 98.7 F (37.1 C) (08/25 1230) Pulse Rate:  [58-72] 69 (08/25 1230) Resp:  [14-20] 16 (08/25 1230) BP: (122-157)/(52-71) 145/71 mmHg (08/25 1230) SpO2:  [99 %-100 %] 99 % (08/25 0500) Weight:  [75.8 kg (167 lb 1.7 oz)] 75.8 kg (167 lb 1.7 oz) (08/24 1957) Last BM Date: 01/24/13 General:   Alert,  Well-developed, well-nourished, rather quiet, in NAD Head:  Normocephalic and atraumatic. Eyes:  Sclera clear, no icterus.   Conjunctiva pale. Mouth:   No ulcerations or lesions.  Oropharynx pink & moist. Lungs:  Clear anteriorly to auscultation.   No wheezes, crackles, or rhonchi. No evident respiratory distress. Heart:   Regular rate and rhythm; no murmurs, clicks,  rubs,  or gallops. Abdomen:  Soft, nontender, nontympanitic, and nondistended. No masses, hepatosplenomegaly or ventral hernias noted, without bruits, guarding, or rebound.  Msk:   Symmetrical without gross deformities. Extremities:   Without clubbing, cyanosis, or edema. Neurologic:  No obvious focal deficits. Patient not frankly confused, but speech is a little slow and slightly inappropriate Psych:   Alert but slightly distant affect, relatively non-talkative.  Intake/Output from previous day: 08/24 0701 - 08/25 0700 In: -  Out: 500 [Urine:500] Intake/Output this shift: Total I/O In: 12.5 [Blood:12.5] Out: 925 [Urine:925]  Lab Results:  Recent Labs  01/23/13 1620 01/24/13 0555  WBC 8.8 7.2  HGB 8.5* 6.5*  HCT 26.9* 21.2*  PLT 321 296   BMET  Recent Labs  01/23/13 1620 01/24/13 0555  NA 134* 137  K 4.1 4.1  CL 100 105  CO2 21 25  GLUCOSE 111* 94  BUN 19 17  CREATININE 0.96 1.08  CALCIUM 9.4 8.9   LFT  Recent Labs  01/23/13 1620  PROT 7.4  ALBUMIN 3.2*  AST 17  ALT 19  ALKPHOS 76  BILITOT 0.2*   PT/INR No results found for this basename: LABPROT, INR,  in the last 72 hours   Studies/Results: No results found.  Impression: 1. Recurrent melenic stool 2. Anemia, microcytic, acute on chronic 3. Small bowel lesions as noted above, without findings on upper endoscopy or colonoscopy to otherwise explain bleeding 4. Large right-sided lung mass, evaluation not yet undertaken  Plan: 1. The surgeons have recommended a small bowel capsule endoscopy prior to surgical exploration, and I feel that this is clinically appropriate 2. Unfortunately, the patient is insistent on going home today, and has indicated he does not want surgery. His wife seems to be supporting him in this decision. 3. I have discussed the case with the attending physician. If indeed he chooses to go home, we will be on standby in the event that he returns for further evaluation and  treatment. 4. If the patient decides to stay, we will plan for a small bowel capsule endoscopy tomorrow.     LOS: 1 day   Gad Aymond V  01/24/2013, 12:46 PM

## 2013-01-25 ENCOUNTER — Telehealth: Payer: Self-pay | Admitting: *Deleted

## 2013-01-25 ENCOUNTER — Encounter (HOSPITAL_COMMUNITY)
Admission: EM | Disposition: A | Discharge status: Home / Self Care | Payer: Self-pay | Source: Home / Self Care | Attending: Internal Medicine

## 2013-01-25 DIAGNOSIS — D490 Neoplasm of unspecified behavior of digestive system: Secondary | ICD-10-CM

## 2013-01-25 LAB — BASIC METABOLIC PANEL
BUN: 13 mg/dL (ref 6–23)
CO2: 26 mEq/L (ref 19–32)
Chloride: 105 mEq/L (ref 96–112)
GFR calc Af Amer: 81 mL/min — ABNORMAL LOW (ref >90)
Glucose, Bld: 113 mg/dL — ABNORMAL HIGH (ref 70–99)
Potassium: 4.2 mEq/L (ref 3.5–5.1)

## 2013-01-25 SURGERY — IMAGING PROCEDURE, GI TRACT, INTRALUMINAL, VIA CAPSULE

## 2013-01-25 NOTE — Telephone Encounter (Signed)
See results note. Pt's wife aware.

## 2013-01-25 NOTE — Telephone Encounter (Signed)
Called and informed pt's wife. She is aware of the info. She said he is in the hospital in [place] because of losing blood. I told her OK to take this when he gets home, but if not home in a few days to let us know.  Routing to Dr. Darrick Penna for FYI.

## 2013-01-25 NOTE — Telephone Encounter (Signed)
Pt's wife called, stating she was returning a call to the nurse. Please advise [phone], or [phone]

## 2013-01-25 NOTE — Telephone Encounter (Signed)
REVIEWED. AGREE. 

## 2013-01-25 NOTE — Progress Notes (Signed)
  Subjective: Pt denies continued melena or hematochezia, states he feels better today than yesterday, no new complaints,   Per nursing, pt has been very confused, fidgety, has removed his IV, and will not let them replace it.   Objective: Vital signs in last 24 hours: Temp:  [98.1 F (36.7 C)-99 F (37.2 C)] 98.1 F (36.7 C) (08/26 0553) Pulse Rate:  [66-78] 78 (08/26 0553) Resp:  [14-20] 16 (08/26 0553) BP: (122-153)/(57-71) 153/66 mmHg (08/26 0553) SpO2:  [99 %-100 %] 100 % (08/26 0553) Last BM Date: 01/24/13  Intake/Output from previous day: 08/25 0701 - 08/26 0700 In: 306.3 [Blood:306.3] Out: 1225 [Urine:1225] Intake/Output this shift:    PE: Gen: NAD, pleasant Resp: CTAB, non labored CV: RRR, good S1/S2, no murmur appreciated Abd: SNTND, + BS, no palpable masses or organomegaly Neuro: Alert and oriented to person and day of the week, did not know he was in the hospital and believed year was 2004.   Lab Results:   Recent Labs  01/23/13 1620 01/24/13 0555 01/24/13 1617 01/24/13 2041  WBC 8.8 7.2  --   --   HGB 8.5* 6.5* 8.2* 7.9*  HCT 26.9* 21.2* 25.6* 25.1*  PLT 321 296  --   --    BMET  Recent Labs  01/23/13 1620 01/24/13 0555  NA 134* 137  K 4.1 4.1  CL 100 105  CO2 21 25  GLUCOSE 111* 94  BUN 19 17  CREATININE 0.96 1.08  CALCIUM 9.4 8.9   PT/INR No results found for this basename: LABPROT, INR,  in the last 72 hours CMP     Component Value Date/Time   NA 137 01/24/2013 0555   K 4.1 01/24/2013 0555   CL 105 01/24/2013 0555   CO2 25 01/24/2013 0555   GLUCOSE 94 01/24/2013 0555   BUN 17 01/24/2013 0555   CREATININE 1.08 01/24/2013 0555   CALCIUM 8.9 01/24/2013 0555   PROT 7.4 01/23/2013 1620   ALBUMIN 3.2* 01/23/2013 1620   AST 17 01/23/2013 1620   ALT 19 01/23/2013 1620   ALKPHOS 76 01/23/2013 1620   BILITOT 0.2* 01/23/2013 1620   GFRNONAA 66* 01/24/2013 0555   GFRAA 77* 01/24/2013 0555   Lipase  No results found for this basename: lipase        Studies/Results: No results found.  Anti-infectives: Anti-infectives   None      Assessment/Plan  1. Melanotic stools secondary to upper GI bleed  2. Small bowel wall thickening x 2 areas, ? Masses  3. Right lung mass  4. H/o tobacco use, quit 20 yrs ago  5. CAD, s/p CABG  6. HTN 7. Dementia with delirium  - Agree with GI and will plan to proceed with capsule endoscopy to r/o other sources of bleeding and to further evaluate areas of concern.  - With the patients current confusion, nursing will wait until his wife is at bedside to administer for frequent re-orientation.  - continue transfusion and resuscitation as needed - Will continue to follow    LOS: 2 days    Kevin Fenton 01/25/2013, 8:04 AM Pager: [phone]

## 2013-01-25 NOTE — Progress Notes (Signed)
Pt discharged per instructions. Discharge instructions/orders discussed with pt and his wife and their son. Wife verbalized understanding of follow up care, follow up appts, medicines, etc. Pt refused wheelchair and walked to the ED entrance with family and tech/sitter.   Alwyn Ren, RN

## 2013-01-25 NOTE — Telephone Encounter (Signed)
LM for Mrs. Neuman to call.

## 2013-01-25 NOTE — Progress Notes (Signed)
I have seen and examined the pt and agree with Dr. Caleb Popp progress note. Await capsule endoscopy.

## 2013-01-25 NOTE — Progress Notes (Signed)
Case discussed with patient's nurse, endoscopy nurse, and attending hospitalist physician.  Basically, with his dementia, delirium, confusion, and uncooperativeness, it is unlikely that the capsule endoscopy study is feasible, because it requires him to maintain monitoring equipment attached to his body for 12 consecutive hours and he is not even keeping his IV in place.  I have recommended that a family conference be held to go over goals of care, including our current diagnostic impressions and our concerns about the patient's likely prognosis, with respect to both his bleeding and his lung mass.  If I can be of further help in this patient's care, please feel free to contact me. However, for the time being, I will sign off since, in view of his limited cooperation, it does not appear that I have anything to offer the patient at this time.  Florencia Reasons, M.D. [phone]

## 2013-01-25 NOTE — Discharge Summary (Signed)
Physician Discharge Summary  Jonathan Watson NFA:[id number] DOB: January 12, 1941 DOA: 01/23/2013  PCP: Colon Branch, MD  Admit date: 01/23/2013 Discharge date: 01/25/2013  Time spent: greater than 30 minutes  Recommendations for Outpatient Follow-up:  1. Recommend hospice.  Have asked family to f/u with PCP to arrange, as it is after 5 pm when wife and patient requesting discharge.  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  H/o recent admission at [facility] for same.  EGD showed gastritis, bx positive for H. Pylori, not yet treated.  Colonoscopy small hemorroids. No bleeding on either.  CT abd/pelvis showed 2 areas in small bowel concerning for neoplasm. Also lung mass to found, to be evaluated after discharge.  Was to f/u at [facility] for small bowel enteroscopy, but patient presented to [facility] with dizziness and dark stools prior to appointment.  Hospital Course:  Admitted to tele. HGB dropped to 6.5 and patient transfused 1 unit PRBC.  Dizziness and HGB improved.  Consulted Dr. Matthias Hughs, who discussed the case with GI at [facility].  Surgery was consulted and small bowel capsule study was planned.  Unfortunately, due to dementia and patient's ability to cooperate made testing difficult.  Patient and family continuously threatened to leave AMA.  Family would not agree to trying sedation, as he had a paradoxical reaction  at outside hospital.  Patient did agree to blood transfusion, but ripped out his iv afterward and tried to leave the unit repeatedly.  Capsule endoscopy was cancelled, as it was felt patient would be unable to cooperate for the 12 hours it would take.  After discussing with wife, it became clear that the patient and wife would not agree to surgical resection.  Family voices understanding that this is possibly cancer, and therefore potentially terminal.  They wish to take patient home, as he is less agitated at home.  As his hgb is stable at 9, he is discharged.  Will need hospice arranged by PCP.  Wife agreeable to this plan.  If hospice arranged, no need to f/u with Dr. Laneta Simmers for evaluation of lung mass  Procedures:  none  Consultations: [facility]  Discharge Exam: Filed Vitals:   01/25/13 1337  BP: 171/79  Pulse: 71  Temp: 97.4 F (36.3 C)  Resp: 18    General: walking around the unit in street clothes.  Briefly redirectable Cardiovascular: RRR without WRR Respiratory: CTA without [facility]  Discharge Instructions   Future Appointments Provider Department Dept Phone   02/02/2013 1:00 PM Alleen Borne, MD [facility] [phone]       Medication List         amoxicillin 500 MG tablet  Commonly known as:  AMOXIL  2 PO BID FOR 10 DAYS     atorvastatin 20 MG tablet  Commonly known as:  LIPITOR  Take 20 mg by  mouth every evening.     clarithromycin 500 MG tablet  Commonly known as:  BIAXIN  1 PO BID FOR 10 DAYS.     GARLIC PO  Take 1 tablet by mouth every evening.     metoprolol tartrate 25 MG tablet  Commonly known as:  LOPRESSOR  Take 25 mg by mouth every evening.     omeprazole 20 MG capsule  Commonly known as:  PRILOSEC  Take 20 mg by mouth 2 (two) times daily.     PREPARATION H 0.25-3-85.5 % suppository  Generic drug:  shark liver oil-cocoa butter  Place 1 suppository rectally as needed for hemorrhoids.        No Known Allergies     Follow-up Information   Follow up with Colon Branch, MD. (to discuss hospice)    Specialty:  Internal Medicine   Contact information:   [address] [place] [address] [phone]        The results of significant diagnostics from this hospitalization (including imaging, microbiology, ancillary and laboratory) are listed below for reference.    Significant Diagnostic Studies: Ct Head Wo Contrast  01/16/2013   *RADIOLOGY REPORT*  Clinical Data: Lung mass.  Unintentional weight loss.  Staging.  CT HEAD WITHOUT CONTRAST  Technique:  Contiguous axial images were obtained from the base of the skull through the vertex without contrast.  Comparison: None.  Findings: The brain shows generalized atrophy.  There is no evidence of old or acute focal infarction, mass lesion, hemorrhage, hydrocephalus or extra-axial collection.  The calvarium is unremarkable.  No significant sinus disease.  There is atherosclerotic calcification of the major vessels at the base of the brain.  IMPRESSION: Atrophy.  No focal or acute finding.   Original Report Authenticated By: Paulina Fusi, M.D.   Ct Angio Chest Pe W/cm &/or Wo Cm  01/14/2013   *RADIOLOGY REPORT*  Clinical Data: Abnormal chest x-ray  CT ANGIOGRAPHY CHEST  Technique:  Multidetector CT imaging of the chest using the standard protocol during bolus administration of intravenous contrast. Multiplanar reconstructed images including MIPs were obtained and reviewed to evaluate the vascular anatomy.  Contrast: OMNIPAQUE IOHEXOL 350 MG/ML SOLN  Comparison: Chest x-ray same day  Findings: Sagittal images of the spine shows degenerative changes thoracic spine.  The patient is status post CABG.  Atherosclerotic calcifications of the thoracic aorta and coronary arteries.  Heart size is within normal limits.  As noted on chest x-ray there is irregular  elongated mass arising from right suprahilar region just above the right pulmonary  artery extending in the right upper lobe.  The lesion is best visualized in the coronal image 69 measures about 6.5 cm cranial caudally by 3.3 cm.  This is highly suspicious for a primary bronchogenic carcinoma.  There is encasement and some narrowing of a subsegmental arterial branch in the right upper lobe in axial image 28. Best visualized in the coronal image 70.  Further evaluation with bronchoscopy/biopsy is recommended.  No significant mediastinal adenopathy.  No left hilar adenopathy.  No adrenal gland mass is noted in visualized upper abdomen.  No definite hepatic lesions are noted in visualized liver.  A precarinal lymph node measures 10 x 9 mm.  No acute infiltrate or pulmonary edema.  No pleural effusion.  IMPRESSION:  1.  There is a elongated irregular mass arising from the right suprahilar region extending in the right upper lobe with mild encasement of the subsegmental pulmonary artery arterial branch. The lesion measures at  least 6.5 cm cranial caudally by 3.3 cm. This is highly suspicious for primary bronchogenic carcinoma. Further evaluation with bronchoscopy/biopsy is recommended. 2.  No significant mediastinal or left hilar adenopathy. 3.  No acute infiltrate or pulmonary edema.  No pleural effusion. 4.  Degenerative changes thoracic spine.   Original Report Authenticated By: Natasha Mead, M.D.   Ct Abdomen Pelvis W Contrast  01/16/2013   *RADIOLOGY REPORT*  Clinical Data: Lung mass and weight loss.  The patient complains of mid abdominal pain.  CT ABDOMEN AND PELVIS WITH CONTRAST  Technique:  Multidetector CT imaging of the abdomen and pelvis was performed following the standard protocol during bolus administration of intravenous contrast.  Contrast: 50mL OMNIPAQUE IOHEXOL 300 MG/ML  SOLN, OMNIPAQUE IOHEXOL 300 MG/ML  SOLN  Comparison: Chest CT 01/14/2013  Findings: A few new densities at the left lung base are suggestive for atelectasis.  Otherwise, the lung bases are clear. No evidence  for free intraperitoneal air.  There are multiple tiny hypoechoic structures in the liver parenchyma.  These are too small to definitively characterize.  The largest measures 5 mm near the hepatic dome.  Normal appearance of the gallbladder, portal venous system, pancreas, spleen, adrenal glands and both kidneys.  There is a 4 mm stone in the left kidney mid pole suggestive for a nonobstructive stone.  Fluid in the urinary bladder and no evidence for ureter stones or hydronephrosis.  The abdominal aorta is heavily calcified without aneurysmal dilatation.  There are calcifications involving the prostate.  No gross abnormality to the prostate or seminal vesicles.  There is focal small bowel wall thickening in the lower mid abdomen on sequence two, image 54.  There is a second area of focal small bowel wall thickening in the left lower abdomen on image number 46. The wall thickening in the left lower abdomen on image 46 has an exophytic component and raises concern for a mass lesion.  No significant mesenteric lymphadenopathy.  No clear evidence for peritoneal implants.  No significant inflammation surrounding the areas of small bowel wall thickening.  Degenerative changes in the lumbar spine without acute bony abnormality.  No suspicious bone lesions.  IMPRESSION: There are two areas of small bowel wall thickening without acute inflammatory changes.  These findings raise concern for neoplastic processes.  Metastatic disease cannot be excluded based on the patient's known right lung mass.  Multiple small low density structures throughout the liver that are too small to definitively characterize.  However, these probably represent small cysts.  Diffuse atherosclerotic disease involving the abdominal aorta.  Nonobstructive left kidney stone.   Original Report Authenticated By: Richarda Overlie, M.D.   Dg Chest Port 1 View  01/14/2013   *RADIOLOGY REPORT*  Clinical Data: 72 year old male with chest pain, bilateral lower  extremity pain.  PORTABLE CHEST - 1 VIEW  Comparison: [facility] chest radiograph 01/20/2004.  Findings: Portable AP upright view of the chest at 1741 hours. New contour abnormality in the right paratracheal station with a 47 mm area of mass-like opacity inseparable from the superior right mediastinal contour. More subtle rounded increased right hilar contour abnormality compared to the prior study.  Elsewhere mediastinal contours are stable.  Stable sequelae of CABG.  Partially visible ACDF hardware.  No pneumothorax or pulmonary edema.  No pleural effusion or other confluent pulmonary opacity.  IMPRESSION: New mass-like opacity compatible with right suprahilar / paratracheal soft tissue mass.  Top differential consideration is bronchogenic carcinoma.  Recommend follow-up chest CT (IV  contrast preferred).   Original Report Authenticated By: Erskine Speed, M.D.    Microbiology: No results found for this or any previous visit (from the past 240 hour(s)).   Labs: Basic Metabolic Panel:  Recent Labs Lab 01/23/13 1620 01/24/13 0555 01/25/13 0857  NA 134* 137 141  K 4.1 4.1 4.2  CL 100 105 105  CO2 21 25 26   GLUCOSE 111* 94 113*  BUN 19 17 13   CREATININE 0.96 1.08 1.03  CALCIUM 9.4 8.9 9.7   Liver Function Tests:  Recent Labs Lab 01/23/13 1620  AST 17  ALT 19  ALKPHOS 76  BILITOT 0.2*  PROT 7.4  ALBUMIN 3.2*   No results found for this basename: LIPASE, AMYLASE,  in the last 168 hours No results found for this basename: AMMONIA,  in the last 168 hours CBC:  Recent Labs Lab 01/23/13 1620 01/24/13 0555 01/24/13 1617 01/24/13 2041 01/25/13 0857 01/25/13 1516  WBC 8.8 7.2  --   --   --   --   HGB 8.5* 6.5* 8.2* 7.9* 9.1* 9.0*  HCT 26.9* 21.2* 25.6* 25.1* 28.4* 28.0*  MCV 78.9 78.8  --   --   --   --   PLT 321 296  --   --   --   --    Cardiac Enzymes: No results found for this basename: CKTOTAL, CKMB, CKMBINDEX, TROPONINI,  in the last 168 hours BNP: BNP  (last 3 results)  Recent Labs  01/14/13 1750  PROBNP 634.6*   CBG: No results found for this basename: GLUCAP,  in the last 168 hours  Signed:  Sumaya Riedesel L  [facility] 01/25/2013, 5:03 PM

## 2013-01-26 LAB — TYPE AND SCREEN
ABO/RH(D): O POS
Antibody Screen: NEGATIVE
Unit division: 0

## 2013-01-26 NOTE — Telephone Encounter (Signed)
PT D/C FROM [facility]. REFERED TO HOSPICE.

## 2013-01-26 NOTE — Care Management Note (Signed)
    Page 1 of 1   01/26/2013     8:15:35 AM   CARE MANAGEMENT NOTE 01/26/2013  Patient:  Jonathan Watson, Jonathan Watson   Account Number:  [id number]  Date Initiated:  01/26/2013  Documentation initiated by:  Letha Cape  Subjective/Objective Assessment:   dx gib  admit- lives with spouse, pta indep.     Action/Plan:   Anticipated DC Date:  01/25/2013   Anticipated DC Plan:  HOME/SELF CARE      DC Planning Services  CM consult      Choice offered to / List presented to:             Status of service:  Completed, signed off Medicare Important Message given?   (If response is "NO", the following Medicare IM given date fields will be blank) Date Medicare IM given:   Date Additional Medicare IM given:    Discharge Disposition:  HOME/SELF CARE  Per UR Regulation:  Reviewed for med. necessity/level of care/duration of stay  If discussed at Long Length of Stay Meetings, dates discussed:    Comments:  01/26/13 8:14 Letha Cape RN, BSN [phone] patient lives with spouse, pta indep.  Patient dc to home, no needs anticipated.

## 2013-01-31 ENCOUNTER — Encounter (HOSPITAL_COMMUNITY): Payer: Self-pay | Admitting: *Deleted

## 2013-01-31 ENCOUNTER — Emergency Department (HOSPITAL_COMMUNITY)
Admission: EM | Admit: 2013-01-31 | Discharge: 2013-01-31 | Disposition: A | Discharge status: Home / Self Care | Payer: Medicare Other | Attending: Emergency Medicine | Admitting: Emergency Medicine

## 2013-01-31 DIAGNOSIS — F05 Delirium due to known physiological condition: Secondary | ICD-10-CM | POA: Insufficient documentation

## 2013-01-31 DIAGNOSIS — R42 Dizziness and giddiness: Secondary | ICD-10-CM | POA: Insufficient documentation

## 2013-01-31 DIAGNOSIS — I10 Essential (primary) hypertension: Secondary | ICD-10-CM | POA: Insufficient documentation

## 2013-01-31 DIAGNOSIS — K625 Hemorrhage of anus and rectum: Secondary | ICD-10-CM | POA: Insufficient documentation

## 2013-01-31 DIAGNOSIS — R5381 Other malaise: Secondary | ICD-10-CM | POA: Insufficient documentation

## 2013-01-31 DIAGNOSIS — Z79899 Other long term (current) drug therapy: Secondary | ICD-10-CM | POA: Insufficient documentation

## 2013-01-31 DIAGNOSIS — R231 Pallor: Secondary | ICD-10-CM | POA: Insufficient documentation

## 2013-01-31 DIAGNOSIS — M6281 Muscle weakness (generalized): Secondary | ICD-10-CM | POA: Insufficient documentation

## 2013-01-31 DIAGNOSIS — R634 Abnormal weight loss: Secondary | ICD-10-CM | POA: Insufficient documentation

## 2013-01-31 DIAGNOSIS — I2510 Atherosclerotic heart disease of native coronary artery without angina pectoris: Secondary | ICD-10-CM | POA: Insufficient documentation

## 2013-01-31 DIAGNOSIS — C3490 Malignant neoplasm of unspecified part of unspecified bronchus or lung: Secondary | ICD-10-CM | POA: Insufficient documentation

## 2013-01-31 DIAGNOSIS — I252 Old myocardial infarction: Secondary | ICD-10-CM | POA: Insufficient documentation

## 2013-01-31 DIAGNOSIS — Z87891 Personal history of nicotine dependence: Secondary | ICD-10-CM | POA: Insufficient documentation

## 2013-01-31 DIAGNOSIS — E785 Hyperlipidemia, unspecified: Secondary | ICD-10-CM | POA: Insufficient documentation

## 2013-01-31 DIAGNOSIS — Z08 Encounter for follow-up examination after completed treatment for malignant neoplasm: Secondary | ICD-10-CM

## 2013-01-31 NOTE — ED Notes (Signed)
Pt discharged from hospital Tuesday & was diagnosed with intestinal cancer. Family requesting to talk to a doctor about the cancer & options

## 2013-01-31 NOTE — ED Provider Notes (Signed)
CSN: [id number]     Arrival date & time 01/31/13  7846 History   First MD Initiated Contact with Patient 01/31/13 0912     Chief Complaint  Patient presents with  . Re-evaluation   (Consider location/radiation/quality/duration/timing/severity/associated sxs/prior Treatment) HPI Patient presents with family members, one week after recent admission for rectal bleeding.  Family denies QT changes in the patient's condition, such as increased frequency of rectal bleeding, any new syncope, behavioral changes, fever. Family is concerned about the patient's progressive fatigue, weight loss, and requests discussion on cancer therapy options.  Patient's of dementia, denies current pain, otherwise history of present illness is limited to information obtained from the family. Past Medical History  Diagnosis Date  . MI (myocardial infarction)   . Hypertension   . Hyperlipidemia   . Coronary artery disease   . Cancer    Past Surgical History  Procedure Laterality Date  . Coronary artery bypass graft    . Cervical spine surgery    . Esophagogastroduodenoscopy N/A 01/15/2013    Procedure: ESOPHAGOGASTRODUODENOSCOPY (EGD);  Surgeon: West Bali, MD;  Location: [facility];  Service: Endoscopy;  Laterality: N/A;  . Colonoscopy N/A 01/17/2013    Procedure: COLONOSCOPY;  Surgeon: West Bali, MD;  Location: [facility];  Service: Endoscopy;  Laterality: N/A;   No family history on file. History  Substance Use Topics  . Smoking status: Former Smoker -- 1.50 packs/day for 25 years  . Smokeless tobacco: Not on file  . Alcohol Use: No    Review of Systems  Constitutional: Negative for fever, activity change, appetite change and fatigue.  HENT: Negative for trouble swallowing.   Eyes: Negative for visual disturbance.  Respiratory: Negative for cough, chest tightness and shortness of breath.   Cardiovascular: Negative for chest pain and leg swelling.  Gastrointestinal: Positive for blood in  stool and hematochezia. Negative for nausea, vomiting, abdominal pain, diarrhea and constipation.  Endocrine: Negative for polyuria.  Genitourinary: Negative for dysuria, urgency, decreased urine volume and difficulty urinating.  Skin: Negative for wound.  Neurological: Positive for dizziness and weakness. Negative for loss of consciousness.  Psychiatric/Behavioral: Positive for confusion.    Allergies  Review of patient's allergies indicates no known allergies.  Home Medications   Current Outpatient Rx  Name  Route  Sig  Dispense  Refill  . amoxicillin (AMOXIL) 500 MG tablet      2 PO BID FOR 10 DAYS   40 tablet   0   . atorvastatin (LIPITOR) 20 MG tablet   Oral   Take 20 mg by mouth every evening.         . clarithromycin (BIAXIN) 500 MG tablet      1 PO BID FOR 10 DAYS.   20 tablet   0   . GARLIC PO   Oral   Take 1 tablet by mouth every evening.         . metoprolol tartrate (LOPRESSOR) 25 MG tablet   Oral   Take 25 mg by mouth every evening.         Marland Kitchen omeprazole (PRILOSEC) 20 MG capsule   Oral   Take 20 mg by mouth 2 (two) times daily.         . shark liver oil-cocoa butter (PREPARATION H) 0.25-3-85.5 % suppository   Rectal   Place 1 suppository rectally as needed for hemorrhoids.          BP 121/67  Pulse 71  Temp(Src) 98 F (36.7  C) (Oral)  Resp 18  Ht 5\' 7"  (1.702 m)  Wt 167 lb (75.751 kg)  BMI 26.15 kg/m2  SpO2 97% Physical Exam  Constitutional: He appears well-developed and well-nourished. No distress.  HENT:  Head: Normocephalic and atraumatic.  Eyes: Pupils are equal, round, and reactive to light. Right eye exhibits no discharge. Left eye exhibits no discharge.  Neck: No tracheal deviation present.  Pulmonary/Chest: Effort normal. No stridor. No respiratory distress.  Abdominal: He exhibits no distension.  Musculoskeletal:  No gross deformity  Neurological: He is alert.  Skin: There is pallor.  Psychiatric: He has a normal mood  and affect.    ED Course  Procedures (including critical care time) Labs Review Labs Reviewed - No data to display Imaging Review No results found. Prior to the initial evaluation I reviewed the patient's chart, including recent admission here with CT evidence of multiple masses, in the thorax and peritoneal cavity consistent with metastatic cancer. MDM   1. Encounter for follow-up surveillance of lung cancer    Patient presents largely with familial concerns of lack of information regarding cancer therapy and options.  There are no new acute changes endorsed by the patient or family members in terms of increased rectal bleeding, any syncope, falls.  We had a lengthy discussion regarding findings thus far, options for care and absent any evidence of distress, any notable changes the past week, there is no indication for additional blood work or other imaging.  Patient was discharged, per the family's request.  They will enter hospice services expeditiously.    Gerhard Munch, MD 01/31/13 1036

## 2013-02-02 ENCOUNTER — Encounter: Payer: Medicare Other | Admitting: Surgery

## 2013-02-04 ENCOUNTER — Encounter: Payer: Self-pay | Admitting: Surgery

## 2013-02-04 ENCOUNTER — Telehealth: Payer: Self-pay

## 2013-02-04 NOTE — Telephone Encounter (Signed)
Pt's daughter-in-law , Burna Mortimer called back. I told her that I had spoke to pt and he said at this time for [place] not to discuss his options with daughter-in-law, that he has to talk it over with his wife who makes the decisions. I told her he was very gracious and very kind, he just said no, not at this time.   She is aware that I will let Dr. Darrick Penna know her concerns.

## 2013-02-04 NOTE — Telephone Encounter (Signed)
REVIEWED.  PLEASE CALL PT'S DAUGHTER-IN-LAW. BECAUSE OF HIPAA-A HEALTH CARE LAW-I CANNOT DISCUSS HIS HEALTH INFO WITH HER UNLESS HE GIVES ME PERMISSION.

## 2013-02-04 NOTE — Telephone Encounter (Signed)
Wanda is aware

## 2013-02-04 NOTE — Telephone Encounter (Signed)
T/C from pt's daughter-in-law, Imanuel Pruiett. She is very concerned about pt. Said he is at home with hospice and they have only been out once. She said that the pt is doing fairly well at this time. She said he said he has had some rectal bleeding, but she is not sure how much. she and her husband have talked to him and he is at times interested in having some surgery, but then gets influenced by his wife who had a situation in her family with a sister that had had back surgery and they found cancer in her bone and she did not last long. She said the pt had said it was ok for her to call and speak to Dr. Darrick Penna about his situation.   Burna Mortimer feels that if he came in to see Dr. Darrick Penna with the family, and had some options explained to him that he would be more able to make a decision about what he really wants to have done. I told her that I would have to call pt and see what he says.   I called pt and he said he was aware and said ok for his daughter-in-law to call. I asked him how he was doing and he said doing very well. He said he has not had any rectal bleeding in awhile. I asked him if he had thought about options of surgery and he said yes, but he would have to discuss with his wife because she takes care of everything and knows more about that than he does. Then I asked him if it was OK for Dr. Darrick Penna to discuss options he has with Burna Mortimer and he said not at this time. I have tried to call Burna Mortimer back at [phone] and her VM has not been set up.  Pt can be reached at home at [phone].

## 2013-02-14 ENCOUNTER — Telehealth: Payer: Self-pay

## 2013-02-14 NOTE — Telephone Encounter (Signed)
VM from [place] at [phone], I could not understand the message, I have tried to call her back and VM is not set up. ( I did understand that she wanted me to call pt at [phone] but I will talk to her first and find out exactly what is going on).

## 2013-02-15 NOTE — Telephone Encounter (Signed)
I called and spoke to Jonathan Watson. She said they have talked to the pt and he is interested in Dr. Evelina Dun calling him and discussing some options that he has. I called pt and comfirmed that, he said he would like to know what choices he has. I told him Dr. Evelina Dun has had a death in her family, and I am not sure when she will get to call, but I will send her the message.

## 2013-02-16 ENCOUNTER — Telehealth: Payer: Self-pay | Admitting: Gastroenterology

## 2013-02-16 NOTE — Telephone Encounter (Signed)
Jonathan Watson called regarding patient and was wanting to set up OV. I offered OV with SF next available, but she has multiple questions of what they need to do since patient is losing blood. I told her if patient was passing blood he should go to the ER. She said that was the problem they don't know where he is losing blood from. I told her since the nurse had talked with her sister Burna Mortimer earlier and was more familiar with the situation I would have DS call her back the patient may need an Urgent Spot. [phone]

## 2013-02-16 NOTE — Telephone Encounter (Signed)
Vm from [place], said that she called the pt and he is having really black stools, very weak and difficulty breathing. I called her back at the number and she was gone a delivery. I spoke to her husband, Amada Jupiter, who is the pt's son. I told him if pt is having the above symptoms,( and he said he was also), that he needs to go to the ED. He said he will let pt know.

## 2013-02-16 NOTE — Telephone Encounter (Signed)
T/C from [place]. She said that the family is trying to get together to have a meeting. Pt is being influenced by his children and his second wife. One day he wants to know what his options are to help him live longer and then he changes his mind because his wife tells him that it is not need to try to do anything. She said she will keep [place] informed as to how things go.

## 2013-02-16 NOTE — Telephone Encounter (Signed)
Routing to AS also.

## 2013-02-16 NOTE — Telephone Encounter (Signed)
I returned Sherry's call. She is a daughter-in-law also. She said pt is really wishing that he had found out more about what his options were and how optimistic things were. He said he would come in with the family and discuss with Dr. Darrick Penna if she will do so. Darl Pikes was going to give an appt in 2-3 weeks and she said he is looking pale and is weak and she wants to know if anything can be done before then. She said he has not said he is losing blood, but today is the first day that he said he really does not feel well. Most of the time he says that he is doing ok. I told her to check with him and if he is having any rectal bleed he should let us know at once. She said that she would.

## 2013-02-16 NOTE — Telephone Encounter (Signed)
Agree 

## 2013-02-16 NOTE — Telephone Encounter (Signed)
Routing to Dr. Darrick Penna for tomorrow.

## 2013-02-17 ENCOUNTER — Inpatient Hospital Stay (HOSPITAL_COMMUNITY)
Admission: EM | Admit: 2013-02-17 | Discharge: 2013-02-18 | DRG: 812 | Disposition: A | Discharge status: Hospice | Attending: Internal Medicine | Admitting: Internal Medicine

## 2013-02-17 ENCOUNTER — Encounter (HOSPITAL_COMMUNITY): Payer: Self-pay | Admitting: *Deleted

## 2013-02-17 DIAGNOSIS — I10 Essential (primary) hypertension: Secondary | ICD-10-CM | POA: Diagnosis present

## 2013-02-17 DIAGNOSIS — D62 Acute posthemorrhagic anemia: Principal | ICD-10-CM | POA: Diagnosis present

## 2013-02-17 DIAGNOSIS — R222 Localized swelling, mass and lump, trunk: Secondary | ICD-10-CM

## 2013-02-17 DIAGNOSIS — B9681 Helicobacter pylori [H. pylori] as the cause of diseases classified elsewhere: Secondary | ICD-10-CM

## 2013-02-17 DIAGNOSIS — R918 Other nonspecific abnormal finding of lung field: Secondary | ICD-10-CM

## 2013-02-17 DIAGNOSIS — Z951 Presence of aortocoronary bypass graft: Secondary | ICD-10-CM

## 2013-02-17 DIAGNOSIS — C3490 Malignant neoplasm of unspecified part of unspecified bronchus or lung: Secondary | ICD-10-CM | POA: Diagnosis present

## 2013-02-17 DIAGNOSIS — I2510 Atherosclerotic heart disease of native coronary artery without angina pectoris: Secondary | ICD-10-CM | POA: Diagnosis present

## 2013-02-17 DIAGNOSIS — F0390 Unspecified dementia without behavioral disturbance: Secondary | ICD-10-CM | POA: Diagnosis present

## 2013-02-17 DIAGNOSIS — K6389 Other specified diseases of intestine: Secondary | ICD-10-CM | POA: Diagnosis present

## 2013-02-17 DIAGNOSIS — E785 Hyperlipidemia, unspecified: Secondary | ICD-10-CM | POA: Diagnosis present

## 2013-02-17 DIAGNOSIS — C784 Secondary malignant neoplasm of small intestine: Secondary | ICD-10-CM | POA: Diagnosis present

## 2013-02-17 DIAGNOSIS — K922 Gastrointestinal hemorrhage, unspecified: Secondary | ICD-10-CM | POA: Diagnosis present

## 2013-02-17 DIAGNOSIS — I252 Old myocardial infarction: Secondary | ICD-10-CM

## 2013-02-17 DIAGNOSIS — D649 Anemia, unspecified: Secondary | ICD-10-CM

## 2013-02-17 DIAGNOSIS — R195 Other fecal abnormalities: Secondary | ICD-10-CM

## 2013-02-17 DIAGNOSIS — R42 Dizziness and giddiness: Secondary | ICD-10-CM

## 2013-02-17 DIAGNOSIS — Z87891 Personal history of nicotine dependence: Secondary | ICD-10-CM

## 2013-02-17 DIAGNOSIS — C3491 Malignant neoplasm of unspecified part of right bronchus or lung: Secondary | ICD-10-CM

## 2013-02-17 DIAGNOSIS — K219 Gastro-esophageal reflux disease without esophagitis: Secondary | ICD-10-CM

## 2013-02-17 DIAGNOSIS — Z515 Encounter for palliative care: Secondary | ICD-10-CM

## 2013-02-17 HISTORY — DX: Unspecified dementia without behavioral disturbance: F03.90

## 2013-02-17 LAB — CBC WITH DIFFERENTIAL/PLATELET
Basophils Relative: 1 % (ref 0–1)
Eosinophils Relative: 5 % (ref 0–5)
HCT: 17.9 % — ABNORMAL LOW (ref 39.0–52.0)
Hemoglobin: 5.3 g/dL — CL (ref 13.0–17.0)
Lymphocytes Relative: 12 % (ref 12–46)
Lymphs Abs: 1 10*3/uL (ref 0.7–4.0)
MCV: 76.5 fL — ABNORMAL LOW (ref 78.0–100.0)
Monocytes Relative: 7 % (ref 3–12)
Neutro Abs: 6.5 10*3/uL (ref 1.7–7.7)
RBC: 2.34 MIL/uL — ABNORMAL LOW (ref 4.22–5.81)
RDW: 19 % — ABNORMAL HIGH (ref 11.5–15.5)
WBC: 8.6 10*3/uL (ref 4.0–10.5)

## 2013-02-17 LAB — COMPREHENSIVE METABOLIC PANEL
BUN: 16 mg/dL (ref 6–23)
CO2: 24 mEq/L (ref 19–32)
Chloride: 102 mEq/L (ref 96–112)
Creatinine, Ser: 1.23 mg/dL (ref 0.50–1.35)
GFR calc Af Amer: 65 mL/min — ABNORMAL LOW (ref >90)
Potassium: 3.9 mEq/L (ref 3.5–5.1)

## 2013-02-17 MED ORDER — ONDANSETRON HCL 4 MG/2ML IJ SOLN: 4.0000 mg | Freq: Four times a day (QID) | INTRAMUSCULAR | Status: DC | PRN

## 2013-02-17 MED ORDER — SODIUM CHLORIDE 0.9 % IV SOLN
INTRAVENOUS | Status: DC
  Administered 2013-02-17: 1000 mL via INTRAVENOUS

## 2013-02-17 MED ORDER — PANTOPRAZOLE SODIUM 40 MG IV SOLR: 40.0000 mg | INTRAVENOUS | Status: DC

## 2013-02-17 MED ORDER — PANTOPRAZOLE SODIUM 40 MG IV SOLR
40.0000 mg | Freq: Two times a day (BID) | INTRAVENOUS | Status: DC
  Administered 2013-02-17: 40 mg via INTRAVENOUS
  Filled 2013-02-17: qty 40

## 2013-02-17 MED ORDER — ONDANSETRON HCL 4 MG PO TABS: 4.0000 mg | ORAL_TABLET | Freq: Four times a day (QID) | ORAL | Status: DC | PRN

## 2013-02-17 MED ORDER — FAMOTIDINE IN NACL 20-0.9 MG/50ML-% IV SOLN
20.0000 mg | INTRAVENOUS | Status: AC
  Administered 2013-02-17: 20 mg via INTRAVENOUS
  Filled 2013-02-17: qty 50

## 2013-02-17 MED ORDER — ONDANSETRON HCL 4 MG/2ML IJ SOLN: 4.0000 mg | Freq: Three times a day (TID) | INTRAMUSCULAR | Status: DC | PRN

## 2013-02-17 MED ORDER — SODIUM CHLORIDE 0.9 % IV SOLN: INTRAVENOUS | Status: DC

## 2013-02-17 MED ORDER — PANTOPRAZOLE SODIUM 40 MG IV SOLR
40.0000 mg | INTRAVENOUS | Status: AC
  Administered 2013-02-17: 40 mg via INTRAVENOUS
  Filled 2013-02-17: qty 40

## 2013-02-17 MED ORDER — SODIUM CHLORIDE 0.9 % IJ SOLN
3.0000 mL | Freq: Two times a day (BID) | INTRAMUSCULAR | Status: DC
  Administered 2013-02-17: 3 mL via INTRAVENOUS

## 2013-02-17 NOTE — ED Provider Notes (Signed)
CSN: [id number]     Arrival date & time 02/17/13  1644 History   First MD Initiated Contact with Patient 02/17/13 1710     Chief Complaint  Patient presents with  . Shortness of Breath  . 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 He states that he has had approximately 6 months of black stools, he has had 2 admissions in the last couple of months for it anemia which is thought to be related to GI bleeding. 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 He has been treated for this, he persisted have intermittent dizziness and feeling weak. Over the last couple of days he has become very pale and weak. His symptoms are persistent, severe, he denies fevers chills coughing shortness of breath or swelling.  Level 5 caveat applies secondary to dementia.  Patient is a 72 y.o. male presenting with shortness of breath and hematochezia. The history is provided by the patient.  Shortness of Breath Rectal Bleeding   Past Medical History  Diagnosis Date  . MI (myocardial infarction)   . Hypertension   . Hyperlipidemia   . Coronary artery disease   . Cancer    Past Surgical History  Procedure Laterality Date  . Coronary artery bypass graft    . Cervical spine surgery    . Esophagogastroduodenoscopy N/A 01/15/2013    Procedure: ESOPHAGOGASTRODUODENOSCOPY (EGD);  Surgeon: West Bali, MD;  [facility];  Service: Endoscopy;  Laterality: N/A;  . Colonoscopy N/A 01/17/2013    Procedure: COLONOSCOPY;  Surgeon: West Bali, MD;  [facility];  Service: Endoscopy;  Laterality: N/A;   No family history on file. History  Substance Use Topics  .  Smoking status: Former Smoker -- 1.50 packs/day for 25 years  . Smokeless tobacco: Not on file  . Alcohol Use: No    Review of Systems  Respiratory: Positive for shortness of breath.   Gastrointestinal: Positive for hematochezia.  All other systems reviewed and are negative.    Allergies  Review of patient's allergies indicates no known allergies.  Home Medications   Current Outpatient Rx  Name  Route  Sig  Dispense  Refill  . GARLIC PO   Oral   Take 1 tablet by mouth every evening.         . metoprolol tartrate (LOPRESSOR) 25 MG tablet   Oral   Take 25 mg by mouth every evening.         Marland Kitchen omeprazole (PRILOSEC) 20 MG capsule   Oral   Take 20 mg by mouth 2 (two) times daily.         . shark liver oil-cocoa butter (PREPARATION H) 0.25-3-85.5 % suppository   Rectal   Place 1 suppository rectally as needed for hemorrhoids.          BP 133/59  Pulse 63  Temp(Src) 98.1 F (36.7 C) (Oral)  Resp 18  SpO2 96% Physical Exam  Nursing note and vitals reviewed. Constitutional: He appears well-developed and well-nourished. No distress.  HENT:  Head: Normocephalic and atraumatic.  Mouth/Throat: Oropharynx is clear and moist. No oropharyngeal exudate.  Eyes: EOM are normal. Pupils are equal, round, and reactive to light. Right eye exhibits no discharge. Left eye  exhibits no discharge. No scleral icterus.  Pale conjunctivae  Neck: Normal range of motion. Neck supple. No JVD present. No thyromegaly present.  Cardiovascular: Normal rate, regular rhythm, normal heart sounds and intact distal pulses.  Exam reveals no gallop and no friction rub.   No murmur heard. Pulmonary/Chest: Effort normal and breath sounds normal. No respiratory distress. He has no wheezes. He has no rales.  Abdominal: Soft. Bowel sounds are normal. He exhibits no distension and no mass. There is no tenderness.  Genitourinary:  Rectal exam shows brownish black melena, no bright red blood, no fissures  or hemorrhoids  Musculoskeletal: Normal range of motion. He exhibits no edema and no tenderness.  Lymphadenopathy:    He has no cervical adenopathy.  Neurological: He is alert. Coordination normal.  Skin: Skin is warm and dry. No rash noted. No erythema.  Psychiatric: He has a normal mood and affect. His behavior is normal.    ED Course  Procedures (including critical care time) Labs Review Labs Reviewed  CBC WITH DIFFERENTIAL - Abnormal; Notable for the following:    RBC 2.34 (*)    Hemoglobin 5.3 (*)    HCT 17.9 (*)    MCV 76.5 (*)    MCH 22.6 (*)    MCHC 29.6 (*)    RDW 19.0 (*)    All other components within normal limits  COMPREHENSIVE METABOLIC PANEL - Abnormal; Notable for the following:    Glucose, Bld 107 (*)    Albumin 3.0 (*)    Total Bilirubin <0.1 (*)    GFR calc non Af Amer 56 (*)    GFR calc Af Amer 65 (*)    All other components within normal limits  TYPE AND SCREEN  PREPARE RBC (CROSSMATCH)   Imaging Review No results found.  MDM   1. GI bleed   2. Anemia    The patient appears pale as if he is anemic, he has ongoing bleeding from his GI tract which is likely related to the small bowel masses or gastritis. He will need a hemoglobin check, he appears hemodynamically stable otherwise.  D/w Dr. Sherrie Mustache who agrees with admission.  Hgb ~ 5, likely needs recurrent transfusion.  HD stable.  Meds given in ED:  Medications  famotidine (PEPCID) IVPB 20 mg (0 mg Intravenous Stopped 02/17/13 1858)  pantoprazole (PROTONIX) injection 40 mg (40 mg Intravenous Given 02/17/13 1748)      Vida Roller, MD 02/17/13 1902

## 2013-02-17 NOTE — Telephone Encounter (Signed)
REVIEWED.  

## 2013-02-17 NOTE — H&P (Signed)
[facility] History and Physical  BODI PALMERI ZOX:[id number] DOB: 10/28/1940 DOA: 02/17/2013  Referring physician: ER. PCP: Colon Branch, MD  Specialists: Dr Darrick Penna, gastroenterology.  Chief Complaint: Melena.  HPI: Jonathan Watson is a 72 y.o. male who presents to the hospital again with a few days history of melena. He has been feeling weak. He was found to have a hemoglobin of 5.3 on arrival to the emergency room. He has been recently diagnosed with a irregular mass arising from the right suprahilar region extending in the right upper lobe with mild encasement of the sub-segmental pulmonary artery arterial branch, measuring 6.5 cm x 3.3 cm, highly suggestive of  primary bronchogenic carcinoma and 2 small bowel lesions thought to be due to small bowel cancer, possibly metastatic disease. He was scheduled to have video capsule endoscopy but the patient became very agitated and refused to have the procedure done. This was at [facility]. He does have mild dementia. The wife now tells me that he has agreed to have further procedures done if necessary. He is receiving blood transfusion in the emergency room. There is no abdominal pain, nausea or vomiting or hematemesis.   Review of Systems:  Apart from history of present illness, other systems negative.  Past Medical History  Diagnosis Date  . MI (myocardial infarction)   . Hypertension   . Hyperlipidemia   . Coronary artery disease   . Cancer    Past Surgical History  Procedure Laterality Date  . Coronary artery bypass graft    . Cervical spine surgery    . Esophagogastroduodenoscopy N/A 01/15/2013    Procedure: ESOPHAGOGASTRODUODENOSCOPY (EGD);  Surgeon: West Bali, MD;  [facility];  Service: Endoscopy;  Laterality: N/A;  . Colonoscopy N/A 01/17/2013    Procedure: COLONOSCOPY;  Surgeon: West Bali, MD;  [facility];  Service: Endoscopy;  Laterality: N/A;   Social History:  reports  that he has quit smoking. He does not have any smokeless tobacco history on file. He reports that he does not drink alcohol or use illicit drugs.    No Known Allergies  No family history on file. noncontributory.   Prior to Admission medications   Medication Sig Start Date End Date Taking? Authorizing Provider  GARLIC PO Take 1 tablet by mouth every evening.   Yes Historical Provider, MD  metoprolol tartrate (LOPRESSOR) 25 MG tablet Take 25 mg by mouth every evening.   Yes Historical Provider, MD  omeprazole (PRILOSEC) 20 MG capsule Take 20 mg by mouth 2 (two) times daily. 01/17/13  Yes Erick Blinks, MD  shark liver oil-cocoa butter (PREPARATION H) 0.25-3-85.5 % suppository Place 1 suppository rectally as needed for hemorrhoids.    Historical Provider, MD   Physical Exam: Filed Vitals:   02/17/13 2000  BP:   Pulse: 66  Temp:   Resp: 16     General:  He looks pale, consistent with his extremely low hemoglobin. There is no clubbing.  Eyes: Pallor. No jaundice.  ENT: No abnormalities.  Neck: No neck lymphadenopathy. No supraclavicular lymphadenopathy.  Cardiovascular: Heart sounds are present without murmurs or added sounds.  Respiratory: Lung fields are clear.  Abdomen: Soft, nontender. No hepatomegaly. No masses felt.  Skin: No rash.  Musculoskeletal: No acute joint abnormalities.  Psychiatric: Appropriate affect.  Neurologic: Alert and orientated, no focal neurological signs.  Labs on Admission:  Basic Metabolic Panel:  Recent Labs Lab 02/17/13 1738  NA 136  K 3.9  CL  102  CO2 24  GLUCOSE 107*  BUN 16  CREATININE 1.23  CALCIUM 9.0   Liver Function Tests:  Recent Labs Lab 02/17/13 1738  AST 23  ALT 15  ALKPHOS 80  BILITOT <0.1*  PROT 6.6  ALBUMIN 3.0*     CBC:  Recent Labs Lab 02/17/13 1738  WBC 8.6  NEUTROABS 6.5  HGB 5.3*  HCT 17.9*  MCV 76.5*  PLT 246      BNP (last 3 results)  Recent Labs  01/14/13 1750  PROBNP 634.6*         Assessment/Plan   1. GI bleed, likely arising from small bowel lesions. Previous EGD was unremarkable. 2. Acute blood loss anemia secondary to #1. Requiring 2 units blood transfusion at the present time. Patient is hemodynamically stable. 3. Lung mass, almost certainly lung cancer. 4. Small bowel lesions, possibly metastatic in origin. 5. Hypertension. 6. Coronary disease, stable.  Plan: 1. Admit to medical floor. 2. Agree with 2 units blood transfusion. Monitor hemoglobin closely. 3. Gastroenterology consultation. He will need video capsule endoscopy as was previously planned.  Further recommendations will depend on patient's hospital progress.   Code Status: Full code.  Family Communication: Discussed with patient and patient's wife at the bedside.  Disposition Plan: Home when medically stable.   Time spent: 60 minutes.  Wilson Singer [facility] Pager [phone].  If 7PM-7AM, please contact night-coverage [email or web address] Password [facility] 02/17/2013, 8:43 PM

## 2013-02-17 NOTE — ED Notes (Signed)
Pt states blood in the stool x 6-8 months but is getting worse with "dizzy spells and weakness" per wife. Pt states increased SOB. 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 Dx with [facility] August 24th.

## 2013-02-17 NOTE — ED Notes (Signed)
CRITICAL VALUE ALERT  Critical value received:  Hgb 5.3  Date of notification:  02/17/13  Time of notification:  1756  Critical value read back:yes  Nurse who received alert:  B. Garner Nash, RN  MD notified (1st page):  Hyacinth Meeker  Time of first page:  1756  MD notified (2nd page):  Time of second page:  Responding MD:  Hyacinth Meeker  Time MD responded:  [phone]

## 2013-02-17 NOTE — Telephone Encounter (Signed)
Jonathan Watson is aware of OV on 10/8 at 0830 with SF

## 2013-02-18 ENCOUNTER — Encounter (HOSPITAL_COMMUNITY): Payer: Self-pay | Admitting: Internal Medicine

## 2013-02-18 DIAGNOSIS — C3490 Malignant neoplasm of unspecified part of unspecified bronchus or lung: Secondary | ICD-10-CM

## 2013-02-18 LAB — PROTIME-INR: Prothrombin Time: 14.3 seconds (ref 11.6–15.2)

## 2013-02-18 LAB — TYPE AND SCREEN

## 2013-02-18 LAB — COMPREHENSIVE METABOLIC PANEL
AST: 18 U/L (ref 0–37)
Albumin: 3.1 g/dL — ABNORMAL LOW (ref 3.5–5.2)
Alkaline Phosphatase: 85 U/L (ref 39–117)
CO2: 25 mEq/L (ref 19–32)
Chloride: 106 mEq/L (ref 96–112)
GFR calc non Af Amer: 60 mL/min — ABNORMAL LOW (ref >90)
Potassium: 4 mEq/L (ref 3.5–5.1)
Total Bilirubin: 0.4 mg/dL (ref 0.3–1.2)

## 2013-02-18 LAB — CBC
MCH: 24.2 pg — ABNORMAL LOW (ref 26.0–34.0)
MCHC: 30.5 g/dL (ref 30.0–36.0)
MCV: 79.4 fL (ref 78.0–100.0)
Platelets: 262 10*3/uL (ref 150–400)
RDW: 17.9 % — ABNORMAL HIGH (ref 11.5–15.5)

## 2013-02-18 NOTE — Progress Notes (Signed)
UR Chart Review Completed  

## 2013-02-18 NOTE — Plan of Care (Signed)
Problem: Discharge Progression Outcomes Goal: Other Discharge Outcomes/Goals Outcome: Completed/Met Date Met:  02/18/13 Discharged to home Hospice to see pt

## 2013-02-18 NOTE — Progress Notes (Addendum)
Patient ID: KWINTON MAAHS, male   DOB: 07/14/1940, 72 y.o.   MRN: [id number] SPOKE WITH WIFE AND SON. EXPLAINED PT HAS A MEDICAL CONDITION THAT WE ARE UNABLE TO MAKE BETTER WITHOUT SURGERY. HE HAS BEEN SEEN AND EVALUATED BY SURGERY AND THE RISKS OF SURGERY OUTWEIGH THE BENEFITS. PT IS ENROLLED IN HOSPICE AND I EXPLAINED WHY WE SHOULD NOT CONTINUE TO TRANSFUSE HIM. SHE VOICED HER UNDERSTANDING AND IS COMFORTABLE;E WITH HIM CONTINUING IN HOSPICE. DISCUSSED WITH DR. Sherrie Mustache. NO ADDITIONAL TREATMENT OPTIONS ARE AVAILABLE TO THE PT CONSIDERING HIS MULTIPLE CO-MORBIDITIES. WIFE WILL CALL MY OFC WITH QUESTIONS OR CONCERNS.

## 2013-02-18 NOTE — Care Management Note (Signed)
    Page 1 of 1   02/18/2013     2:45:39 PM   CARE MANAGEMENT NOTE 02/18/2013  Patient:  Jonathan Watson, Jonathan Watson   Account Number:  [id number]  Date Initiated:  02/18/2013  Documentation initiated by:  Rosemary Holms  Subjective/Objective Assessment:   Pt admitted from home with low hbg. Pt has [facility] in their home. CM spoke to [facility] at [facility] and advised that pt would like Hospice back in the home. They agree to only palitive care, no more transfusions.     Action/Plan:   Anticipated DC Date:  02/18/2013   Anticipated DC Plan:  HOME W HOSPICE CARE      DC Planning Services  CM consult      Choice offered to / List presented to:             [facility] agency  HOSPICE   Status of service:  Completed, signed off Medicare Important Message given?   (If response is "NO", the following Medicare IM given date fields will be blank) Date Medicare IM given:   Date Additional Medicare IM given:    Discharge Disposition:  HOME W HOSPICE CARE  Per UR Regulation:    If discussed at Long Length of Stay Meetings, dates discussed:    Comments:  02/18/13 Rosemary Holms RN BSN CM Per Efraim Kaufmann at [facility], On call RN will contact pt/family at home this evening.

## 2013-02-18 NOTE — Progress Notes (Signed)
Pt refusing all care refuses telemetry and IV access Dr Sherrie Mustache is aware

## 2013-02-18 NOTE — Discharge Summary (Signed)
Physician Discharge Summary  Jonathan Watson:[id number] DOB: Jun 11, 1940 DOA: 02/17/2013  PCP: Colon Branch, MD  Admit date: 02/17/2013 Discharge date: 02/18/2013  Time spent: Greater than 30 minutes  Recommendations for Outpatient Follow-up:  1. Hospice will follow the patient at home. 2. He is no longer a candidate for packed red blood cell transfusions. He is not a candidate for chemotherapy or surgery.  Discharge Diagnoses:  1. Acute blood loss anemia, likely secondary to metastatic small bowel lesions/upper GI bleeding. Status post 2 units of packed red blood cell transfusions. 2. 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 The patient is not a candidate for surgery, chemotherapy, or radiation. 3. Hypertension. Stable. 4. Coronary artery disease. Stable. 5. Chronic dementia. Stable.  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 He was found to have the lung mass during the previous hospitalization at [facility]. He presented to the emergency department on 02/17/2013 with a complaint of black stools. In the emergency department, he was afebrile and hemodynamically stable. His lab data were significant for hemoglobin of 5.3 and hematocrit of 17.9. He was admitted for further evaluation and management.  Hospital Course:  The patient was admitted on the assumption that he would be agreeable to further endoscopic evaluation for the small bowel lesions, thought to be metastatic from presumed lung cancer.  He had become agitated, confused, and refused to have a video capsule endoscopy performed during the previous hospitalization. However, upon further discussion with his family, they noted that he had are ready been referred to hospice and hospice had actually started managing him at home. He and his family were told that he was not a candidate for aggressive therapy due to his history of coronary artery disease, status post CABG and dementia. I consulted Dr. Darrick Penna who was planning to see the patient as an outpatient. She had a discussion with the patient, his wife, and his son. She informed them that the findings would not improve without surgery. However, given his comorbid conditions, the risk of an invasive operation would outweigh the benefits. She recommended no further treatment other than comfort and supportive measures. She recommended no further transfusions. This was agreed upon by the dictating physician and family.  The patient was transfused 2 units of packed red blood cells on admission, started in the emergency department. His hemoglobin did in through to 8.2. He remained afebrile and hemodynamically stable. He had no complaints of chest pain, shortness of breath, or abdominal pain. His diet was advanced which he tolerated well.  We were all in agreement that the patient should resume hospice care at home. He should not be transfused any further. This was discussed with the patient, his wife, and his son. They were all in agreement.  Procedures:  None  Consultations:  Gastroenterology  Discharge Exam: Filed Vitals:   02/18/13 0544  BP: 134/69  Pulse: 79  Temp: 97.9 F (36.6 C)  Resp: 18    General: Pleasant, but slightly confused 72 year old man in no acute distress. Cardiovascular: S1, S2, with no  murmurs rubs or gallops. Respiratory: Clear to auscultation bilaterally.  Discharge Instructions  Discharge Orders   Future Appointments Provider Department Dept Phone    02/23/2013 9:00 AM Alleen Borne, MD [facility] [place] [phone]   03/09/2013 8:30 AM West Bali, MD [facility] [phone]   Future Orders Complete By Expires   Diet general  As directed    Increase activity slowly  As directed        Medication List    STOP taking these medications       GARLIC PO     PREPARATION H 2.95-6-21.3 % suppository  Generic drug:  shark liver oil-cocoa butter      TAKE these medications       metoprolol tartrate 25 MG tablet  Commonly known as:  LOPRESSOR  Take 25 mg by mouth every evening.     omeprazole 20 MG capsule  Commonly known as:  PRILOSEC  Take 20 mg by mouth 2 (two) times daily.       No Known Allergies    The results of significant diagnostics from this hospitalization (including imaging, microbiology, ancillary and laboratory) are listed below for reference.    Significant Diagnostic Studies: No results found.  Microbiology: No results found for this or any previous visit (from the past 240 hour(s)).   Labs: Basic Metabolic Panel:  Recent Labs Lab 02/17/13 1738 02/18/13 0552  NA 136 140  K 3.9 4.0  CL 102 106  CO2 24 25  GLUCOSE 107* 94  BUN 16 13  CREATININE 1.23 1.17  CALCIUM 9.0 9.6   Liver Function Tests:  Recent Labs Lab 02/17/13 1738 02/18/13 0552  AST 23 18  ALT 15 13  ALKPHOS 80 85  BILITOT <0.1* 0.4  PROT 6.6 6.9  ALBUMIN 3.0* 3.1*   No results found for this basename: LIPASE, AMYLASE,  in the last 168 hours No results found for this basename: AMMONIA,  in the last 168 hours CBC:  Recent Labs Lab 02/17/13 1738 02/18/13 0552  WBC 8.6 8.5  NEUTROABS 6.5  --   HGB 5.3* 8.2*  HCT 17.9* 26.9*  MCV 76.5* 79.4  PLT 246 262   Cardiac Enzymes: No results found for this basename: CKTOTAL, CKMB, CKMBINDEX, TROPONINI,  in the last 168 hours BNP: BNP (last 3 results)  Recent Labs  01/14/13 1750  PROBNP 634.6*    CBG: No results found for this basename: GLUCAP,  in the last 168 hours     Signed:  Calieb Lichtman  [facility] 02/18/2013, 12:59 PM

## 2013-02-21 ENCOUNTER — Telehealth: Payer: Self-pay | Admitting: Gastroenterology

## 2013-02-21 NOTE — Telephone Encounter (Signed)
Earland Reish called this morning to let us know that the patient's wife may be calling to cancel his OV and she and the rest of the family does not want [place] to cancel OV for 10/8 with SF. I told her that I would document this in patient's chart and if his wife calls that we would stress the importance of him keeping this appointment and that she is more than welcome to come to the OV for better understanding. Daughter agreed. I told her that I would let DS know as well.

## 2013-02-21 NOTE — Telephone Encounter (Signed)
I SAW MR. Sessums IN THE HOSPITAL. IF WIFE CALLS TO CANCEL APPT, CANCEL THE APPT. NO NEED TO SPEAK WITH DAUGHTER UNLESS PT OR HIS WIFE GIVES [place] PERMISSION.

## 2013-02-21 NOTE — Telephone Encounter (Signed)
Agreed -

## 2013-02-22 ENCOUNTER — Encounter: Payer: Self-pay | Admitting: *Deleted

## 2013-02-22 DIAGNOSIS — R918 Other nonspecific abnormal finding of lung field: Secondary | ICD-10-CM

## 2013-02-22 DIAGNOSIS — I209 Angina pectoris, unspecified: Secondary | ICD-10-CM | POA: Insufficient documentation

## 2013-02-22 DIAGNOSIS — K639 Disease of intestine, unspecified: Secondary | ICD-10-CM

## 2013-02-23 ENCOUNTER — Encounter: Payer: Medicare Other | Admitting: Surgery

## 2013-03-07 ENCOUNTER — Encounter (HOSPITAL_COMMUNITY): Payer: Self-pay | Admitting: Emergency Medicine

## 2013-03-07 ENCOUNTER — Other Ambulatory Visit: Payer: Self-pay

## 2013-03-07 ENCOUNTER — Inpatient Hospital Stay (HOSPITAL_COMMUNITY)
Admission: EM | Admit: 2013-03-07 | Discharge: 2013-03-08 | DRG: 378 | Disposition: A | Discharge status: Home / Self Care | Payer: Medicare Other | Attending: Internal Medicine | Admitting: Internal Medicine

## 2013-03-07 DIAGNOSIS — K219 Gastro-esophageal reflux disease without esophagitis: Secondary | ICD-10-CM

## 2013-03-07 DIAGNOSIS — R918 Other nonspecific abnormal finding of lung field: Secondary | ICD-10-CM

## 2013-03-07 DIAGNOSIS — K921 Melena: Principal | ICD-10-CM | POA: Diagnosis present

## 2013-03-07 DIAGNOSIS — B9681 Helicobacter pylori [H. pylori] as the cause of diseases classified elsewhere: Secondary | ICD-10-CM

## 2013-03-07 DIAGNOSIS — K922 Gastrointestinal hemorrhage, unspecified: Secondary | ICD-10-CM

## 2013-03-07 DIAGNOSIS — E785 Hyperlipidemia, unspecified: Secondary | ICD-10-CM

## 2013-03-07 DIAGNOSIS — K6389 Other specified diseases of intestine: Secondary | ICD-10-CM

## 2013-03-07 DIAGNOSIS — R42 Dizziness and giddiness: Secondary | ICD-10-CM

## 2013-03-07 DIAGNOSIS — D62 Acute posthemorrhagic anemia: Secondary | ICD-10-CM

## 2013-03-07 DIAGNOSIS — I209 Angina pectoris, unspecified: Secondary | ICD-10-CM

## 2013-03-07 DIAGNOSIS — I252 Old myocardial infarction: Secondary | ICD-10-CM

## 2013-03-07 DIAGNOSIS — Z951 Presence of aortocoronary bypass graft: Secondary | ICD-10-CM

## 2013-03-07 DIAGNOSIS — K639 Disease of intestine, unspecified: Secondary | ICD-10-CM

## 2013-03-07 DIAGNOSIS — D509 Iron deficiency anemia, unspecified: Secondary | ICD-10-CM | POA: Diagnosis present

## 2013-03-07 DIAGNOSIS — I2510 Atherosclerotic heart disease of native coronary artery without angina pectoris: Secondary | ICD-10-CM

## 2013-03-07 DIAGNOSIS — R195 Other fecal abnormalities: Secondary | ICD-10-CM

## 2013-03-07 DIAGNOSIS — F0390 Unspecified dementia without behavioral disturbance: Secondary | ICD-10-CM

## 2013-03-07 DIAGNOSIS — Z87891 Personal history of nicotine dependence: Secondary | ICD-10-CM

## 2013-03-07 DIAGNOSIS — C7889 Secondary malignant neoplasm of other digestive organs: Secondary | ICD-10-CM | POA: Diagnosis present

## 2013-03-07 DIAGNOSIS — I10 Essential (primary) hypertension: Secondary | ICD-10-CM

## 2013-03-07 DIAGNOSIS — C3490 Malignant neoplasm of unspecified part of unspecified bronchus or lung: Secondary | ICD-10-CM | POA: Diagnosis present

## 2013-03-07 LAB — BASIC METABOLIC PANEL
CO2: 24 mEq/L (ref 19–32)
Calcium: 9 mg/dL (ref 8.4–10.5)
Creatinine, Ser: 1.19 mg/dL (ref 0.50–1.35)
GFR calc non Af Amer: 59 mL/min — ABNORMAL LOW (ref >90)

## 2013-03-07 LAB — CBC WITH DIFFERENTIAL/PLATELET
Basophils Absolute: 0.1 10*3/uL (ref 0.0–0.1)
Eosinophils Absolute: 0.4 10*3/uL (ref 0.0–0.7)
Eosinophils Relative: 5 % (ref 0–5)
Lymphs Abs: 1 10*3/uL (ref 0.7–4.0)
MCH: 22.5 pg — ABNORMAL LOW (ref 26.0–34.0)
MCHC: 29.9 g/dL — ABNORMAL LOW (ref 30.0–36.0)
MCV: 75 fL — ABNORMAL LOW (ref 78.0–100.0)
Monocytes Absolute: 0.8 10*3/uL (ref 0.1–1.0)
Neutrophils Relative %: 71 % (ref 43–77)
Platelets: 434 10*3/uL — ABNORMAL HIGH (ref 150–400)
RDW: 19 % — ABNORMAL HIGH (ref 11.5–15.5)

## 2013-03-07 LAB — TROPONIN I: Troponin I: <0.3 ng/mL (ref <0.30)

## 2013-03-07 NOTE — ED Notes (Signed)
Dr. Bebe Shaggy made aware of patient's Hgb

## 2013-03-07 NOTE — ED Provider Notes (Signed)
CSN: [id number]     Arrival date & time 03/07/13  2149 History  This chart was scribed for Joya Gaskins, MD by Leone Payor, ED Scribe. This patient was seen in room APA02/APA02 and the patient's care was started 11:02 PM.    Chief Complaint  Patient presents with  . Rectal Bleeding    Patient is a 72 y.o. male presenting with hematochezia. The history is provided by the patient and a relative. No language interpreter was used.  Rectal Bleeding Quality:  Black and tarry Amount:  Moderate Progression:  Unchanged Chronicity:  Recurrent Similar prior episodes: yes   Associated symptoms: no abdominal pain, no fever, no loss of consciousness and no vomiting     HPI Comments: Jonathan Watson is a 72 y.o. male who presents to the Emergency Department complaining of ongoing, intermittent rectal bleeding that began over 1 month ago, worse in the past several days. Pt's family states he has had several blood infusions for this in the past. States having increased weakness and chest pain today with exertion. He reports having black, tarry stools recently. He denies taking any anticoagulants. He denies fevers, abdominal pain, emesis, LOC, head injury.   Past Medical History  Diagnosis Date  . MI (myocardial infarction)   . Hypertension   . Hyperlipidemia   . Coronary artery disease   . Dementia 01/24/2013  . GERD (gastroesophageal reflux disease)   . Blood transfusion without reported diagnosis    Past Surgical History  Procedure Laterality Date  . Coronary artery bypass graft    . Cervical spine surgery    . Esophagogastroduodenoscopy N/A 01/15/2013    Procedure: ESOPHAGOGASTRODUODENOSCOPY (EGD);  Surgeon: West Bali, MD;  Location: [facility];  Service: Endoscopy;  Laterality: N/A;  . Colonoscopy N/A 01/17/2013    Procedure: COLONOSCOPY;  Surgeon: West Bali, MD;  Location: [facility];  Service: Endoscopy;  Laterality: N/A;   History reviewed. No pertinent family  history. History  Substance Use Topics  . Smoking status: Former Smoker -- 1.50 packs/day for 25 years  . Smokeless tobacco: Not on file  . Alcohol Use: No    Review of Systems  Constitutional: Negative for fever.  Gastrointestinal: Positive for hematochezia and anal bleeding. Negative for vomiting and abdominal pain.  Neurological: Positive for weakness. Negative for loss of consciousness and syncope.  All other systems reviewed and are negative.    Allergies  Review of patient's allergies indicates no known allergies.  Home Medications   Current Outpatient Rx  Name  Route  Sig  Dispense  Refill  . acetaminophen (TYLENOL) 500 MG tablet   Oral   Take 500 mg by mouth every 6 (six) hours as needed for pain.         Tery Sanfilippo Calcium (STOOL SOFTENER PO)   Oral   Take 1 tablet by mouth 2 (two) times daily.         . metoprolol tartrate (LOPRESSOR) 25 MG tablet   Oral   Take 25 mg by mouth every evening.         Marland Kitchen omeprazole (PRILOSEC) 20 MG capsule   Oral   Take 20 mg by mouth 2 (two) times daily.          BP 123/54  Pulse 71  Temp(Src) 98.2 F (36.8 C) (Oral)  Resp 18  Ht 5\' 7"  (1.702 m)  Wt 167 lb (75.751 kg)  BMI 26.15 kg/m2  SpO2 98% Physical Exam  Nursing note  and vitals reviewed.  CONSTITUTIONAL: Well developed/well nourished HEAD: Normocephalic/atraumatic EYES: EOMI/PERRL, conjunctiva pink.  ENMT: Mucous membranes moist NECK: supple no meningeal signs SPINE:entire spine nontender CV: S1/S2 noted, no murmurs/rubs/gallops noted LUNGS: Lungs are clear to auscultation bilaterally, no apparent distress ABDOMEN: soft, nontender, no rebound or guarding RECTAL: stool is dark. Chaperone present. No masses noted.  GU:no cva tenderness NEURO: Pt is awake/alert, moves all extremitiesx4 EXTREMITIES: pulses normal, full ROM SKIN: warm, pt is pale.  PSYCH: no abnormalities of mood noted   ED Course  Procedures   DIAGNOSTIC STUDIES: Oxygen  Saturation is 98% on RA, normal by my interpretation.    COORDINATION OF CARE: 11:05 PM Will order CBC, BMP, troponin. Discussed treatment plan with pt at bedside and pt agreed to plan.   11:50 PM Pt found to be anemic.  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  He now "wants everything done" D/w dr Orvan Falconer, will admit to stepdown Pt agrees to receive blood transfusion.  He understands risks, and he signed consent Labs Review Labs Reviewed  CBC WITH DIFFERENTIAL  BASIC METABOLIC PANEL     MDM  No diagnosis found. Nursing notes including past medical history and social history reviewed and considered in documentation Labs/vital reviewed and considered      Date: 03/07/2013  Rate: 66  Rhythm: normal sinus rhythm  QRS Axis: normal  Intervals: normal  ST/T Wave abnormalities: nonspecific ST changes  Conduction Disutrbances:none    I personally performed the services described in this documentation, which was scribed in my presence. The recorded information has been reviewed and is accurate.      Joya Gaskins, MD 03/07/13 2351

## 2013-03-07 NOTE — ED Notes (Signed)
Patient reports rectal bleeding for approximately 2 months, states black, tarry stools. Reports chest pain and abdominal pain intermittently today.

## 2013-03-08 ENCOUNTER — Encounter (HOSPITAL_COMMUNITY): Payer: Self-pay | Admitting: *Deleted

## 2013-03-08 DIAGNOSIS — K922 Gastrointestinal hemorrhage, unspecified: Secondary | ICD-10-CM

## 2013-03-08 DIAGNOSIS — I2510 Atherosclerotic heart disease of native coronary artery without angina pectoris: Secondary | ICD-10-CM

## 2013-03-08 DIAGNOSIS — D509 Iron deficiency anemia, unspecified: Secondary | ICD-10-CM

## 2013-03-08 DIAGNOSIS — R222 Localized swelling, mass and lump, trunk: Secondary | ICD-10-CM

## 2013-03-08 DIAGNOSIS — D62 Acute posthemorrhagic anemia: Secondary | ICD-10-CM

## 2013-03-08 DIAGNOSIS — K5989 Other specified functional intestinal disorders: Secondary | ICD-10-CM

## 2013-03-08 DIAGNOSIS — F0390 Unspecified dementia without behavioral disturbance: Secondary | ICD-10-CM

## 2013-03-08 LAB — CBC
Hemoglobin: 7.4 g/dL — ABNORMAL LOW (ref 13.0–17.0)
MCH: 25.1 pg — ABNORMAL LOW (ref 26.0–34.0)
MCV: 78.3 fL (ref 78.0–100.0)
Platelets: 340 10*3/uL (ref 150–400)
RBC: 2.95 MIL/uL — ABNORMAL LOW (ref 4.22–5.81)
RDW: 18.7 % — ABNORMAL HIGH (ref 11.5–15.5)
WBC: 8.3 10*3/uL (ref 4.0–10.5)

## 2013-03-08 LAB — COMPREHENSIVE METABOLIC PANEL
ALT: 28 U/L (ref 0–53)
AST: 30 U/L (ref 0–37)
Albumin: 2.7 g/dL — ABNORMAL LOW (ref 3.5–5.2)
CO2: 23 mEq/L (ref 19–32)
Calcium: 9 mg/dL (ref 8.4–10.5)
Chloride: 105 mEq/L (ref 96–112)
Creatinine, Ser: 1.1 mg/dL (ref 0.50–1.35)
GFR calc Af Amer: 75 mL/min — ABNORMAL LOW (ref >90)
GFR calc non Af Amer: 65 mL/min — ABNORMAL LOW (ref >90)
Glucose, Bld: 92 mg/dL (ref 70–99)
Potassium: 4.7 mEq/L (ref 3.5–5.1)
Total Bilirubin: 0.4 mg/dL (ref 0.3–1.2)
Total Protein: 6.4 g/dL (ref 6.0–8.3)

## 2013-03-08 LAB — OCCULT BLOOD, POC DEVICE: Fecal Occult Bld: POSITIVE — AB

## 2013-03-08 LAB — IRON AND TIBC
Iron: 14 ug/dL — ABNORMAL LOW (ref 42–135)
Saturation Ratios: 4 % — ABNORMAL LOW (ref 20–55)
UIBC: 351 ug/dL (ref 125–400)

## 2013-03-08 LAB — MAGNESIUM: Magnesium: 2.1 mg/dL (ref 1.5–2.5)

## 2013-03-08 LAB — MRSA PCR SCREENING: MRSA by PCR: POSITIVE — AB

## 2013-03-08 LAB — FERRITIN: Ferritin: 7 ng/mL — ABNORMAL LOW (ref 22–322)

## 2013-03-08 LAB — RETICULOCYTES
RBC.: 2.37 MIL/uL — ABNORMAL LOW (ref 4.22–5.81)
Retic Count, Absolute: 87.7 10*3/uL (ref 19.0–186.0)

## 2013-03-08 LAB — HEMOGLOBIN A1C: Mean Plasma Glucose: 105 mg/dL (ref <117)

## 2013-03-08 LAB — FOLATE: Folate: >20 ng/mL

## 2013-03-08 MED ORDER — CHLORHEXIDINE GLUCONATE CLOTH 2 % EX PADS
6.0000 | MEDICATED_PAD | Freq: Every day | CUTANEOUS | Status: DC
  Administered 2013-03-08: 6 via TOPICAL

## 2013-03-08 MED ORDER — SODIUM CHLORIDE 0.9 % IJ SOLN
3.0000 mL | Freq: Two times a day (BID) | INTRAMUSCULAR | Status: DC
  Administered 2013-03-08: 3 mL via INTRAVENOUS

## 2013-03-08 MED ORDER — POTASSIUM CHLORIDE IN NACL 20-0.9 MEQ/L-% IV SOLN
INTRAVENOUS | Status: DC
  Administered 2013-03-08: 17:00:00 via INTRAVENOUS
  Administered 2013-03-08: 1000 mL via INTRAVENOUS

## 2013-03-08 MED ORDER — INFLUENZA VAC SPLIT QUAD 0.5 ML IM SUSP
0.5000 mL | INTRAMUSCULAR | Status: DC
  Filled 2013-03-08: qty 0.5

## 2013-03-08 MED ORDER — ONDANSETRON HCL 4 MG/2ML IJ SOLN: 4.0000 mg | INTRAMUSCULAR | Status: DC | PRN

## 2013-03-08 MED ORDER — MUPIROCIN 2 % EX OINT
1.0000 "application " | TOPICAL_OINTMENT | Freq: Two times a day (BID) | CUTANEOUS | Status: DC
  Administered 2013-03-08: 1 via NASAL
  Filled 2013-03-08: qty 22

## 2013-03-08 MED ORDER — MUPIROCIN 2 % EX OINT: 1.0000 "application " | TOPICAL_OINTMENT | Freq: Two times a day (BID) | CUTANEOUS | Status: DC

## 2013-03-08 MED ORDER — PNEUMOCOCCAL VAC POLYVALENT 25 MCG/0.5ML IJ INJ
0.5000 mL | INJECTION | INTRAMUSCULAR | Status: DC
  Filled 2013-03-08: qty 0.5

## 2013-03-08 MED ORDER — PANTOPRAZOLE SODIUM 40 MG IV SOLR: 40.0000 mg | Freq: Two times a day (BID) | INTRAVENOUS | Status: DC

## 2013-03-08 MED ORDER — SODIUM CHLORIDE 0.9 % IV SOLN
1020.0000 mg | Freq: Once | INTRAVENOUS | Status: AC
  Administered 2013-03-08: 1020 mg via INTRAVENOUS
  Filled 2013-03-08: qty 34

## 2013-03-08 MED ORDER — PANTOPRAZOLE SODIUM 40 MG PO TBEC
40.0000 mg | DELAYED_RELEASE_TABLET | Freq: Two times a day (BID) | ORAL | Status: DC
  Administered 2013-03-08 (×2): 40 mg via ORAL
  Filled 2013-03-08 (×2): qty 1

## 2013-03-08 NOTE — Consult Note (Signed)
Reviewed chart and had discussion with oncology concerning for further management. Exploratory laparotomy is not indicated at this time as we do not have a tissue diagnosis of the lung mass. Agree with need for bronchoscopy with tissue biopsy first. This could subsequently be followed by a capsule endoscopy if oncology deems necessary. Should further workup of the thickened bowel the needed, please do not hesitate to call me. If the lung mass is a bronchogenic carcinoma, oncology does not feel that exploratory laparotomy would change the treatment plan.

## 2013-03-08 NOTE — Consult Note (Signed)
Referring Provider: Dr. Orvan Falconer Primary Care Physician:  Colon Branch, MD Primary Gastroenterologist:  Dr. Darrick Penna   Date of Admission: 03/07/13 Date of Consultation: 03/08/13  Reason for Consultation:  Continuity of care, anemia, family not clear on diagnosis per hospitalist note.   HPI:  72 year old male well-known to our practice presented to the ED yesterday evening with worsening weakness, dizziness, chest discomfort, shortness of breath. His history is somewhat complicated, with several prior hospitalizations secondary to GI bleeding. We originally saw patient August 16th at [facility] secondary to anemia and melena. TCS and EGD performed. 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 No obvious source for presentation found, so he was referred to [facility] for a double balloon enteroscopy. During admission in August, CT chest showed a right lung mass, CT abd/pelvis showed two areas of small bowel wall thickening raising concern for neoplastic process, metastatic disease. He actually was unable to present to [facility] due to rehospitalization at [facility]. He was seen by GI in consultation there who were going to proceed with a capsule study; however, this was never undertaken due to patient's dementia, confusion, and likely inability to maintain monitoring equipment. At that time, surgery recommended a capsule study.   Admitting Hgb was 5.3, with improvement to 7.4 after 2 units. He notes black, tarry stool but denies abdominal pain, nausea, vomiting. Reports feeling weak, dizzy, and inability to take a deep breath.    Past Medical History  Diagnosis Date  . MI (myocardial infarction)   . Hypertension   . Hyperlipidemia   . Coronary artery disease   . Dementia 01/24/2013  . GERD (gastroesophageal reflux disease)   . Blood transfusion without reported diagnosis     Past Surgical History  Procedure Laterality Date  . Coronary artery bypass  graft    . Cervical spine surgery    . Esophagogastroduodenoscopy N/A 01/15/2013    Procedure: ESOPHAGOGASTRODUODENOSCOPY (EGD);  Surgeon: West Bali, MD;  Location: [facility];  Service: Endoscopy;  Laterality: N/A;  . Colonoscopy N/A 01/17/2013    Procedure: COLONOSCOPY;  Surgeon: West Bali, MD;  Location: [facility];  Service: Endoscopy;  Laterality: N/A;    Prior to Admission medications   Medication Sig Start Date End Date Taking? Authorizing Provider  acetaminophen (TYLENOL) 500 MG tablet Take 500 mg by mouth every 6 (six) hours as needed for pain.   Yes Historical Provider, MD  Docusate Calcium (STOOL SOFTENER PO) Take 1 tablet by mouth 2 (two) times daily.   Yes Historical Provider, MD  metoprolol tartrate (LOPRESSOR) 25 MG tablet Take 25 mg by mouth every evening.   Yes Historical Provider, MD  omeprazole (PRILOSEC) 20 MG capsule Take 20 mg by mouth 2 (two) times daily. 01/17/13  Yes Erick Blinks, MD    Current Facility-Administered Medications  Medication Dose Route Frequency Provider Last Rate Last Dose  . 0.9 % NaCl with KCl 20 mEq/ L  infusion   Intravenous Continuous Vania Rea, MD 100 mL/hr at 03/08/13 0900    . Chlorhexidine Gluconate Cloth 2 % PADS 6 each  6 each Topical Q0600 Vania Rea, MD   6 each at 03/08/13 0600  . [START ON 03/09/2013] influenza vac split quadrivalent PF (FLUARIX) injection 0.5 mL  0.5 mL Intramuscular Tomorrow-1000 Vania Rea, MD      . mupirocin ointment (BACTROBAN) 2 % 1 application  1 application Nasal BID Vania Rea, MD      . ondansetron (  ZOFRAN) injection 4 mg  4 mg Intravenous Q4H PRN Vania Rea, MD      . pantoprazole (PROTONIX) EC tablet 40 mg  40 mg Oral BID AC West Bali, MD   40 mg at 03/08/13 0757  . [START ON 03/09/2013] pneumococcal 23 valent vaccine (PNU-IMMUNE) injection 0.5 mL  0.5 mL Intramuscular Tomorrow-1000 Vania Rea, MD      . sodium chloride 0.9 % injection 3 mL  3 mL  Intravenous Q12H Vania Rea, MD        Allergies as of 03/07/2013  . (No Known Allergies)    History reviewed. No pertinent family history.  History   Social History  . Marital Status: Married    Spouse Name: N/A    Number of Children: N/A  . Years of Education: N/A   Occupational History  . Not on file.   Social History Main Topics  . Smoking status: Former Smoker -- 1.50 packs/day for 25 years  . Smokeless tobacco: Not on file  . Alcohol Use: No  . Drug Use: No  . Sexual Activity: Yes   Other Topics Concern  . Not on file   Social History Narrative  . No narrative on file    Review of Systems: As mentioned in HPI.   Physical Exam: Vital signs in last 24 hours: Temp:  [98.2 F (36.8 C)-99 F (37.2 C)] 98.6 F (37 C) (10/07 0730) Pulse Rate:  [55-78] 64 (10/07 0900) Resp:  [10-18] 12 (10/07 0900) BP: (84-123)/(42-84) 115/69 mmHg (10/07 0900) SpO2:  [98 %-100 %] 99 % (10/07 0900) Weight:  [165 lb 12.6 oz (75.2 kg)-171 lb 1.2 oz (77.6 kg)] 171 lb 1.2 oz (77.6 kg) (10/07 0535) Last BM Date: 03/07/13 General:   Alert,  In no distress.  Head:  Normocephalic and atraumatic. Eyes:  Sclera clear, no icterus.   Conjunctiva pink. Ears:  Normal auditory acuity. Nose:  No deformity, discharge,  or lesions. Mouth:  No deformity or lesions Lungs:  Clear throughout to auscultation.   No wheezes, crackles, or rhonchi. No acute distress. Heart:  Regular rate and rhythm; no murmurs, clicks, rubs,  or gallops. Abdomen:  Soft, nontender and nondistended. No masses, hepatosplenomegaly or hernias noted. Normal bowel sounds, without guarding, and without rebound.   Rectal:  Deferred Msk:  Symmetrical without gross deformities. Normal posture. Extremities:  Without clubbing or edema. Neurologic:  Alert and  oriented x4;  grossly normal neurologically. Skin:  Intact without significant lesions or rashes. Psych:  Alert and cooperative. Normal mood and  affect.  Intake/Output from previous day: 10/06 0701 - 10/07 0700 In: 756.7 [Blood:756.7] Out: -  Intake/Output this shift: Total I/O In: 776.7 [P.O.:360; I.V.:416.7] Out: -   Lab Results:  Recent Labs  03/07/13 2249 03/08/13 0836  WBC 7.6 8.3  HGB 5.3* 7.4*  HCT 17.7* 23.1*  PLT 434* 340   BMET  Recent Labs  03/07/13 2249 03/08/13 0836  NA 134* 137  K 4.0 4.7  CL 100 105  CO2 24 23  GLUCOSE 124* 92  BUN 19 17  CREATININE 1.19 1.10  CALCIUM 9.0 9.0   LFT  Recent Labs  03/08/13 0836  PROT 6.4  ALBUMIN 2.7*  AST 30  ALT 28  ALKPHOS 67  BILITOT 0.4    Impression: 72 year old male with with multiple admissions for GI bleeding likely secondary to small bowel etiology; transfusion-dependent anemia noted, with some improvement after 2 units PRBCs. Prior hospitalization at [facility] noted, with attempt at  capsule endoscopy cancelled due to patient confusion, uncooperative. Discussed with Dr. Darrick Penna, who recommends surgical consult at this point. Oncology is on board and plans to further evaluate lung mass with bronchoscopy as inpatient vs outpatient. May ultimately need small bowel capsule endoscopy prior to surgical procedure to further evaluate; will await further surgical recommendations.   Plan: Transfuse as necessary Surgical consult Continue Protonix BID as ordered Further recommendations to follow.   Nira Retort, ANP-BC [facility]     LOS: 1 day    03/08/2013, 9:51 AM

## 2013-03-08 NOTE — Consult Note (Signed)
[facility] Consultation Oncology  Name: Jonathan Watson      MRN: [id number]    Location: IC11/IC11-01  Date: 03/08/2013 Time:9:22 AM   REFERRING PHYSICIAN:  Jonette Mate, MD  REASON FOR CONSULT:  Lung mass and small intestine thickening in 2 areas   DIAGNOSIS:  Lung mass and small intestine thickening in 2 areas  HISTORY OF PRESENT ILLNESS:   This is a 72 year old Caucasian man who was diagnosed with a lung mass on chest Xray on 01/14/2013 and confirmed on CT angio of chest on 01/14/2013.  Additionally, he underwent a CT of abd/pelvis on 01/16/2013 which demonstrated two areas of small bowel thickening.  He also has a past medical history of CAD, GI bleed, GERD, Hyperlipidemia, HTN, and H. Pylori gastritis.  This patient was admitted from 01/14/2013- 01/17/2013 for upper gi bleed.   He was subsequently admitted from 01/23/2013- 01/25/2013 for GI bleed.  Hospice was recommended at that time as he did not want to pursue any intervention for her imaging abnormalities.  Additionally, he was admitted for acute blood loss on 02/17/2013-02/18/2013.  Today, he reports that he was admitted for his "cancer."  Since August 2014 he has received 5 units of PRBCs for anemia.    On this hospitalization, his Hgb was noted to be 5.3 g/dL.  He received 2 units of PRBCs on 03/07/2013.  Of note, his MCV on 03/07/2013 was 75, MCH 22.5, and RDW of 19.0.  There is no anemia panel present in [facility].  Therefore, I have called the lab to add an anemia panel to blood drawn prior to PRBC transfusion.  I suspect he is iron deficient with his hypochromic, microcytic anemia.  He received iron from the PRBC transfusion and therefore iron studies on blood following PRBC transfusion would be futile.   I personally reviewed and went over laboratory results with the patient.  This patient reports that he wants intervention for his lung and small intestine abnormalities.  He reports that his "youngins want something done."  I have  reviewed the patient's chart.   I personally reviewed and went over radiographic studies with the patient.  The full report of CT angio of chest and CT abd/pelvis follow, but the jist of the reports shows a 6.5 cm right lung mass with 2 small bowel thickened areas, both worrisome for metastatic disease.   Jonathan Watson reports that he has lost weight unintentionally over the past 3-6 months.  He reports that his close "fall off of him."  He admits to a 10lb in the last 1-2 months alone.  He reports that his appetite is strong.  He denies hemoptysis.  He denies a cough.  He admits to an intermittent chest pain on exertion that resolves with rest.  It is associated with some dyspnea as well.   He admits to a 40 pack year smoking history, smoking 1-2 ppd x 20-25 years quitting a number of years ago.  He also admits to a history of EtOH abuse drinking 1 case of EtOH every week x 15 years, quitting a number of years ago.    PAST MEDICAL HISTORY:   Past Medical History  Diagnosis Date  . MI (myocardial infarction)   . Hypertension   . Hyperlipidemia   . Coronary artery disease   . Dementia 01/24/2013  . GERD (gastroesophageal reflux disease)   . Blood transfusion without reported diagnosis     ALLERGIES: No Known Allergies    MEDICATIONS: I have reviewed  the patient's current medications.     PAST SURGICAL HISTORY Past Surgical History  Procedure Laterality Date  . Coronary artery bypass graft    . Cervical spine surgery    . Esophagogastroduodenoscopy N/A 01/15/2013    Procedure: ESOPHAGOGASTRODUODENOSCOPY (EGD);  Surgeon: Jonathan Bali, MD;  [facility];  Service: Endoscopy;  Laterality: N/A;  . Colonoscopy N/A 01/17/2013    Procedure: COLONOSCOPY;  Surgeon: Jonathan Bali, MD;  Location[facility];  Service: Endoscopy;  Laterality: N/A;    FAMILY HISTORY: History reviewed. No pertinent family history.  SOCIAL HISTORY:  reports that he has quit smoking. He does not have  any smokeless tobacco history on file. He reports that he does not drink alcohol or use illicit drugs.  PERFORMANCE STATUS: The patient's performance status is 1 - Symptomatic but completely ambulatory  PHYSICAL EXAM: Most Recent Vital Signs: Blood pressure 90/42, pulse 63, temperature 98.6 F (37 C), temperature source Oral, resp. rate 13, height 5\' 7"  (1.702 m), weight 171 lb 1.2 oz (77.6 kg), SpO2 100.00%. General appearance: alert, cooperative, appears stated age and no distress Head: Normocephalic, without obvious abnormality, atraumatic Eyes: negative findings: lids and lashes normal, conjunctivae and sclerae normal, corneas clear and pupils equal, round, reactive to light and accomodation Neck: supple, symmetrical, trachea midline and right anterior cervical chain lymph node that is hard and fixed measuring 1- 1.5 cm with a possble left sided anterior cervical chain lymph node.  Patient postitioning not ideal.    Back: symmetric, no curvature. ROM normal. No CVA tenderness. Lungs: clear to auscultation bilaterally and normal percussion bilaterally Heart: regular rate and rhythm, S1, S2 normal, no murmur, click, rub or gallop Abdomen: soft, non-tender; bowel sounds normal; no masses,  no organomegaly Extremities: extremities normal, atraumatic, no cyanosis or edema Skin: Skin color, texture, turgor normal. No rashes or lesions Lymph nodes: see neck exam.  No axillary lymphadenopathy noted.  No infra/supraclavicular lymph adenopathy noted Neurologic: Grossly normal  LABORATORY DATA:  Results for orders placed during the hospital encounter of 03/07/13 (from the past 48 hour(s))  CBC WITH DIFFERENTIAL     Status: Abnormal   Collection Time    03/07/13 10:49 PM      Result Value Range   WBC 7.6  4.0 - 10.5 K/uL   RBC 2.36 (*) 4.22 - 5.81 MIL/uL   Hemoglobin 5.3 (*) 13.0 - 17.0 g/dL   Comment: RESULT REPEATED AND VERIFIED     CRITICAL RESULT CALLED TO, READ BACK BY AND VERIFIED WITH:      Jonathan Watson AT 2314 ON 03/07/13 BY Jonathan Watson    HCT 17.7 (*) 39.0 - 52.0 %   MCV 75.0 (*) 78.0 - 100.0 fL   MCH 22.5 (*) 26.0 - 34.0 pg   MCHC 29.9 (*) 30.0 - 36.0 g/dL   RDW 62.1 (*) 30.8 - 65.7 %   Platelets 434 (*) 150 - 400 K/uL   Neutrophils Relative % 71  43 - 77 %   Lymphocytes Relative 13  12 - 46 %   Monocytes Relative 10  3 - 12 %   Eosinophils Relative 5  0 - 5 %   Basophils Relative 1  0 - 1 %   Neutro Abs 5.3  1.7 - 7.7 K/uL   Lymphs Abs 1.0  0.7 - 4.0 K/uL   Monocytes Absolute 0.8  0.1 - 1.0 K/uL   Eosinophils Absolute 0.4  0.0 - 0.7 K/uL   Basophils Absolute 0.1  0.0 - 0.1 K/uL   RBC Morphology POLYCHROMASIA PRESENT    BASIC METABOLIC PANEL     Status: Abnormal   Collection Time    03/07/13 10:49 PM      Result Value Range   Sodium 134 (*) 135 - 145 mEq/L   Potassium 4.0  3.5 - 5.1 mEq/L   Chloride 100  96 - 112 mEq/L   CO2 24  19 - 32 mEq/L   Glucose, Bld 124 (*) 70 - 99 mg/dL   BUN 19  6 - 23 mg/dL   Creatinine, Ser 1.61  0.50 - 1.35 mg/dL   Calcium 9.0  8.4 - 09.6 mg/dL   GFR calc non Af Amer 59 (*) >90 mL/min   GFR calc Af Amer 68 (*) >90 mL/min   Comment: (NOTE)     The eGFR has been calculated using the CKD EPI equation.     This calculation has not been validated in all clinical situations.     eGFR's persistently <90 mL/min signify possible Chronic Kidney     Disease.  TROPONIN I     Status: None   Collection Time    03/07/13 11:00 PM      Result Value Range   Troponin I <0.30  <0.30 ng/mL   Comment:            Due to the release kinetics of cTnI,     a negative result within the first hours     of the onset of symptoms does not rule out     myocardial infarction with certainty.     If myocardial infarction is still suspected,     repeat the test at appropriate intervals.  OCCULT BLOOD, POC DEVICE     Status: Abnormal   Collection Time    03/07/13 11:11 PM      Result Value Range   Fecal Occult Bld POSITIVE (*) NEGATIVE  TYPE AND  SCREEN     Status: None   Collection Time    03/07/13 11:22 PM      Result Value Range   ABO/RH(D) O POS     Antibody Screen NEG     Sample Expiration 03/10/2013     Unit Number [id number]     Blood Component Type RED CELLS,LR     Unit division 00     Status of Unit ISSUED     Transfusion Status OK TO TRANSFUSE     Crossmatch Result Compatible     Unit Number [id number]     Blood Component Type RED CELLS,LR     Unit division 00     Status of Unit ISSUED     Transfusion Status OK TO TRANSFUSE     Crossmatch Result Compatible    PREPARE RBC (CROSSMATCH)     Status: None   Collection Time    03/07/13 11:23 PM      Result Value Range   Order Confirmation ORDER PROCESSED BY BLOOD BANK    OCCULT BLOOD, POC DEVICE     Status: Abnormal   Collection Time    03/07/13 11:40 PM      Result Value Range   Fecal Occult Bld POSITIVE (*) NEGATIVE  MRSA PCR SCREENING     Status: Abnormal   Collection Time    03/08/13  1:10 AM      Result Value Range   MRSA by PCR POSITIVE (*) NEGATIVE   Comment:  The GeneXpert MRSA Assay (FDA     approved for NASAL specimens     only), is one component of a     comprehensive MRSA colonization     surveillance program. It is not     intended to diagnose MRSA     infection nor to guide or     monitor treatment for     MRSA infections.     RESULT CALLED TO, READ BACK BY AND VERIFIED WITH:     GREY M AT 0302 ON [id number] BY FORSYTH K  MAGNESIUM     Status: None   Collection Time    03/08/13  8:36 AM      Result Value Range   Magnesium 2.1  1.5 - 2.5 mg/dL  CBC     Status: Abnormal   Collection Time    03/08/13  8:36 AM      Result Value Range   WBC 8.3  4.0 - 10.5 K/uL   RBC 2.95 (*) 4.22 - 5.81 MIL/uL   Hemoglobin 7.4 (*) 13.0 - 17.0 g/dL   Comment: DELTA CHECK NOTED   HCT 23.1 (*) 39.0 - 52.0 %   MCV 78.3  78.0 - 100.0 fL   MCH 25.1 (*) 26.0 - 34.0 pg   MCHC 32.0  30.0 - 36.0 g/dL   RDW 69.6 (*) 29.5 - 28.4 %   Platelets 340   150 - 400 K/uL  COMPREHENSIVE METABOLIC PANEL     Status: Abnormal   Collection Time    03/08/13  8:36 AM      Result Value Range   Sodium 137  135 - 145 mEq/L   Potassium 4.7  3.5 - 5.1 mEq/L   Chloride 105  96 - 112 mEq/L   CO2 23  19 - 32 mEq/L   Glucose, Bld 92  70 - 99 mg/dL   BUN 17  6 - 23 mg/dL   Creatinine, Ser 1.32  0.50 - 1.35 mg/dL   Calcium 9.0  8.4 - 44.0 mg/dL   Total Protein 6.4  6.0 - 8.3 g/dL   Albumin 2.7 (*) 3.5 - 5.2 g/dL   AST 30  0 - 37 U/L   ALT 28  0 - 53 U/L   Alkaline Phosphatase 67  39 - 117 U/L   Total Bilirubin 0.4  0.3 - 1.2 mg/dL   GFR calc non Af Amer 65 (*) >90 mL/min   GFR calc Af Amer 75 (*) >90 mL/min   Comment: (NOTE)     The eGFR has been calculated using the CKD EPI equation.     This calculation has not been validated in all clinical situations.     eGFR's persistently <90 mL/min signify possible Chronic Kidney     Disease.      RADIOGRAPHY:  01/16/2013  *RADIOLOGY REPORT*  Clinical Data: Lung mass and weight loss. The patient complains of  mid abdominal pain.  CT ABDOMEN AND PELVIS WITH CONTRAST  Technique: Multidetector CT imaging of the abdomen and pelvis was  performed following the standard protocol during bolus  administration of intravenous contrast.  Contrast: 50mL OMNIPAQUE IOHEXOL 300 MG/ML SOLN, OMNIPAQUE  IOHEXOL 300 MG/ML SOLN  Comparison: Chest CT 01/14/2013  Findings: A few new densities at the left lung base are suggestive  for atelectasis. Otherwise, the lung bases are clear. No evidence  for free intraperitoneal air.  There are multiple tiny hypoechoic structures in the liver  parenchyma. These are too small to  definitively characterize. The  largest measures 5 mm near the hepatic dome. Normal appearance of  the gallbladder, portal venous system, pancreas, spleen, adrenal  glands and both kidneys. There is a 4 mm stone in the left kidney  mid pole suggestive for a nonobstructive stone. Fluid in the   urinary bladder and no evidence for ureter stones or  hydronephrosis. The abdominal aorta is heavily calcified without  aneurysmal dilatation.  There are calcifications involving the prostate. No gross  abnormality to the prostate or seminal vesicles.  There is focal small bowel wall thickening in the lower mid abdomen  on sequence two, image 54. There is a second area of focal small  bowel wall thickening in the left lower abdomen on image number 46.  The wall thickening in the left lower abdomen on image 46 has an  exophytic component and raises concern for a mass lesion. No  significant mesenteric lymphadenopathy. No clear evidence for  peritoneal implants. No significant inflammation surrounding the  areas of small bowel wall thickening.  Degenerative changes in the lumbar spine without acute bony  abnormality. No suspicious bone lesions.  IMPRESSION:  There are two areas of small bowel wall thickening without acute  inflammatory changes. These findings raise concern for neoplastic  processes. Metastatic disease cannot be excluded based on the  patient's known right lung mass.  Multiple small low density structures throughout the liver that are  too small to definitively characterize. However, these probably  represent small cysts.  Diffuse atherosclerotic disease involving the abdominal aorta.  Nonobstructive left kidney stone.  Original Report Authenticated By: Richarda Overlie, M.D.    01/16/2013  *RADIOLOGY REPORT*  Clinical Data: Lung mass. Unintentional weight loss. Staging.  CT HEAD WITHOUT CONTRAST  Technique: Contiguous axial images were obtained from the base of  the skull through the vertex without contrast.  Comparison: None.  Findings: The brain shows generalized atrophy. There is no  evidence of old or acute focal infarction, mass lesion, hemorrhage,  hydrocephalus or extra-axial collection. The calvarium is  unremarkable. No significant sinus disease. There is   atherosclerotic calcification of the major vessels at the base of  the brain.  IMPRESSION:  Atrophy. No focal or acute finding.  Original Report Authenticated By: Paulina Fusi, M.D.    01/14/2013  *RADIOLOGY REPORT*  Clinical Data: Abnormal chest x-ray  CT ANGIOGRAPHY CHEST  Technique: Multidetector CT imaging of the chest using the  standard protocol during bolus administration of intravenous  contrast. Multiplanar reconstructed images including MIPs were  obtained and reviewed to evaluate the vascular anatomy.  Contrast: OMNIPAQUE IOHEXOL 350 MG/ML SOLN  Comparison: Chest x-ray same day  Findings: Sagittal images of the spine shows degenerative changes  thoracic spine. The patient is status post CABG. Atherosclerotic  calcifications of the thoracic aorta and coronary arteries. Heart  size is within normal limits.  As noted on chest x-ray there is irregular elongated mass arising  from right suprahilar region just above the right pulmonary artery  extending in the right upper lobe. The lesion is best visualized  in the coronal image 69 measures about 6.5 cm cranial caudally by  3.3 cm. This is highly suspicious for a primary bronchogenic  carcinoma. There is encasement and some narrowing of a  subsegmental arterial branch in the right upper lobe in axial image  28. Best visualized in the coronal image 70. Further evaluation  with bronchoscopy/biopsy is recommended.  No significant mediastinal adenopathy. No left hilar adenopathy.  No  adrenal gland mass is noted in visualized upper abdomen. No  definite hepatic lesions are noted in visualized liver. A  precarinal lymph node measures 10 x 9 mm.  No acute infiltrate or pulmonary edema. No pleural effusion.  IMPRESSION:  1. There is a elongated irregular mass arising from the right  suprahilar region extending in the right upper lobe with mild  encasement of the subsegmental pulmonary artery arterial branch.  The lesion  measures at least 6.5 cm cranial caudally by 3.3 cm.  This is highly suspicious for primary bronchogenic carcinoma.  Further evaluation with bronchoscopy/biopsy is recommended.  2. No significant mediastinal or left hilar adenopathy.  3. No acute infiltrate or pulmonary edema. No pleural effusion.  4. Degenerative changes thoracic spine.  Original Report Authenticated By: Natasha Mead, M.D.   01/14/2013  *RADIOLOGY REPORT*  Clinical Data: 72 year old male with chest pain, bilateral lower  extremity pain.  PORTABLE CHEST - 1 VIEW  Comparison: [facility] chest radiograph 01/20/2004.  Findings: Portable AP upright view of the chest at 1741 hours. New  contour abnormality in the right paratracheal station with a 47 mm  area of mass-like opacity inseparable from the superior right  mediastinal contour. More subtle rounded increased right hilar  contour abnormality compared to the prior study.  Elsewhere mediastinal contours are stable. Stable sequelae of  CABG. Partially visible ACDF hardware. No pneumothorax or  pulmonary edema. No pleural effusion or other confluent pulmonary  opacity.  IMPRESSION:  New mass-like opacity compatible with right suprahilar /  paratracheal soft tissue mass. Top differential consideration is  bronchogenic carcinoma. Recommend follow-up chest CT (IV contrast  preferred).  Original Report Authenticated By: Erskine Speed, M.D.      PATHOLOGY:  None    ASSESSMENT:  1. Right suprahilar elongated irregular mass measuring 6.5 cm in largest dimension, suspicisou for bronchogenic carcinoma.  Not biopsy proven at this time. 2. Small bowel thickening in two areas, unknown etiology 3. Hypochromic, microcytic anemia, S/P 2 units PRBC on 03/07/2013.  Likely iron deficiency anemia. 4. AVMs on EGD on 01/15/2013 5. H. Pylori gastritis on 01/15/2013 6. Colonoscopy WNL on 01/17/2013   PLAN:  1. I personally reviewed and went over laboratory results with  the patient. 2. I personally reviewed and went over radiographic studies with the patient. 3. Chart reviewed 4. Anemia panel added to pretranfusion labs 5. Feraheme 1020 mg IV today 6. Recommend Camera Capsule study to further evaluate small intestine thickening.  Resection possibly required. 7. Recommend bronchoscopy either as inpatient or outpatient 8. Will follow along as an inpatient and assist in patient care. 9. Follow-up at the [facility] in 10-14 days following discharge   All questions were answered. The patient knows to call the clinic with any problems, questions or concerns. We can certainly see the patient much sooner if necessary.  Patient and plan discussed with Dr. Alla German and he is in agreement with the aforementioned.   More than 50% of the time spent with the patient was utilized for counseling and coordination of care.   KEFALAS,THOMAS   No contraindication to discharge with rest of workup as outpatient, including Pilcam study, bronchoscopy and biopsy.   May need surgical resection if small bowel tumor or tumors found. Dr.Yusif Gnau

## 2013-03-08 NOTE — H&P (Signed)
[facility] History and Physical  Jonathan Watson  ZOX:[id number]  DOB: [birth year]   DOA: 03/08/2013   PCP:   Colon Branch, MD   Chief Complaint:  Black stool and weakness  HPI: Jonathan Watson is a 72 y.o. male.   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 He previously had been refusing treatment and was therefore not considered a candidate for surgery, but his condition as the client so rapidly that he is now reversed disposition and is brought to the hospital by his family because he wishes to get treated for his cancer and consider the possibility of surgery for continuous bleeding from his metastatic lesions.  Patient is currently too drowsy and confused to give a history, or sore information is taken from family in attendance and a review of past records.    Rewiew of Systems:   Unable to attain because of patient's mental   Past Medical History  Diagnosis Date  . MI (myocardial infarction)   . Hypertension   . Hyperlipidemia   . Coronary artery disease   . Dementia 01/24/2013  . GERD (gastroesophageal reflux disease)   . Blood transfusion without reported diagnosis     Past Surgical History  Procedure Laterality Date  . Coronary artery bypass graft    . Cervical spine surgery    . Esophagogastroduodenoscopy N/A 01/15/2013    Procedure: ESOPHAGOGASTRODUODENOSCOPY (EGD);  Surgeon: West Bali, MD;  [facility];  Service: Endoscopy;  Laterality: N/A;  . Colonoscopy N/A 01/17/2013    Procedure: COLONOSCOPY;  Surgeon: West Bali, MD;  [facility];  Service: Endoscopy;  Laterality: N/A;    Medications:  HOME MEDS: Prior to Admission medications   Medication Sig Start Date End Date Taking? Authorizing Provider  acetaminophen (TYLENOL) 500 MG tablet Take 500 mg by mouth every 6 (six) hours as needed for pain.   Yes Historical  Provider, MD  Docusate Calcium (STOOL SOFTENER PO) Take 1 tablet by mouth 2 (two) times daily.   Yes Historical Provider, MD  metoprolol tartrate (LOPRESSOR) 25 MG tablet Take 25 mg by mouth every evening.   Yes Historical Provider, MD  omeprazole (PRILOSEC) 20 MG capsule Take 20 mg by mouth 2 (two) times daily. 01/17/13  Yes Erick Blinks, MD     Allergies:  No Known Allergies  Social History:   reports that he has quit smoking. He does not have any smokeless tobacco history on file. He reports that he does not drink alcohol or use illicit drugs.  Family History: History reviewed. No pertinent family history.   Physical Exam: Filed Vitals:   03/08/13 0108 03/08/13 0126 03/08/13 0130 03/08/13 0230  BP:  96/51 99/59   Pulse:  64 62   Temp: 99 F (37.2 C)  98.5 F (36.9 C) 98.8 F (37.1 C)  TempSrc: Oral  Oral Oral  Resp:   11   Height:  5\' 7"  (1.702 m)    Weight:  75.2 kg (165 lb 12.6 oz)    SpO2:  100% 100%    Blood pressure 99/59, pulse 62, temperature 98.8 F (37.1 C), temperature source Oral, resp. rate 11, height 5\' 7"  (1.702 m), weight 75.2 kg (165 lb 12.6 oz), SpO2 100.00%. Body mass index is 25.96 kg/(m^2).   GEN:  Lethargic elderly man lying bed  PSYCH:  Drowsy disoriented  HEENT: Mucous membranes pale and anicteric; \  Breasts:: Not examined CHEST WALL: No tenderness CHEST: Harsh breath sounds bilaterally HEART: Regular rate and rhythm; no murmurs rubs or gallops BACK: Mild kyphosis no scoliosis;  ABDOMEN:  t non-tender;  no organomegaly, normal abdominal bowel sounds; no pannus; no intertriginous candida. Rectal Exam: Not done EXTREMITIES: age-appropriate arthropathy of the hands and knees; no edema; no ulcerations. Genitalia: not examined PULSES: 2+ and symmetric SKIN: Normal hydration no rash or ulceration CNS: Cranial nerves 2-12 grossly intact no focal lateralizing neurologic deficit   Labs on Admission:  Basic Metabolic Panel:  Recent Labs Lab  03/07/13 2249  NA 134*  K 4.0  CL 100  CO2 24  GLUCOSE 124*  BUN 19  CREATININE 1.19  CALCIUM 9.0   Liver Function Tests: No results found for this basename: AST, ALT, ALKPHOS, BILITOT, PROT, ALBUMIN,  in the last 168 hours No results found for this basename: LIPASE, AMYLASE,  in the last 168 hours No results found for this basename: AMMONIA,  in the last 168 hours CBC:  Recent Labs Lab 03/07/13 2249  WBC 7.6  NEUTROABS 5.3  HGB 5.3*  HCT 17.7*  MCV 75.0*  PLT 434*   Cardiac Enzymes:  Recent Labs Lab 03/07/13 2300  TROPONINI <0.30   BNP: No components found with this basename: POCBNP,  D-dimer: No components found with this basename: D-DIMER,  CBG: No results found for this basename: GLUCAP,  in the last 168 hours  Radiological Exams on Admission: No results found.     Assessment/Plan   Active Problems:   CAD (coronary artery disease)   GI bleed   Dementia   Small bowel mass   Lung mass   PLAN:  transfuse 2 units of packed red cells; Supportive care overnight Arrange meeting with patient his family and his gastroenterologist and Arrange meeting with patient his family and oncology service   Other plans as per orders.  Code Status: Full Family Communication:  Plans discuss with family at bedside    Jonathan Watson Nocturnist [facility] Pager [phone]   03/08/2013, 3:14 AM

## 2013-03-08 NOTE — Progress Notes (Signed)
UR Chart Review Completed  

## 2013-03-08 NOTE — Progress Notes (Addendum)
[facility] PROGRESS NOTE  SEBASTIEN JACKSON WUJ:[id number] DOB: [birth year] DOA: 03/07/2013 PCP: Colon Branch, MD  Assessment/Plan: 72 y/o male  With PMH of CAD s/p , GI bleed, GERD, Hyperlipidemia, HTN, and H. Pylori gastritis, recently Dx with metastatic CA on 01/14/2013 (bronchogenic carcinoma with metastatic lesions, ? Small bowel lesion as well) but he refused endoscopy at that time accepted hospice care and discharged home; he presented yesterday with progressive anemia, and now wants evaluation and treatment fop the cancer;   1. Metastatic CA on 01/14/2013 (bronchogenic carcinoma with metastatic lesions, ? Small bowel lesion) -need bronchoscope outpatient and tissues biopsy; d/w with oncology  -pending GI evaluation;   2. Acute blood loss anemia; GIB likely from underlying CA; no new obvious s/s of acute bleeding -Hg improved 5.3-->7.4 after two units of PRBC -need GI evaluation; ? Capsule endoscopy;   3. CAD s/p CABG stable; cont home regimen BB, not on antiplatelets due to GIB     Code Status: full Family Communication: wife at the bedside (indicate person spoken with, relationship, and if by phone, the number) Disposition Plan: home    Consultants:  GI, oncology   Procedures:  CXR  Antibiotics:  None  (indicate start date, and stop date if known)  HPI/Subjective: Feels better   Objective: Filed Vitals:   03/08/13 0900  BP: 115/69  Pulse: 64  Temp:   Resp: 12    Intake/Output Summary (Last 24 hours) at 03/08/13 0953 Last data filed at 03/08/13 0900  Gross per 24 hour  Intake 1533.34 ml  Output      0 ml  Net 1533.34 ml   Filed Weights   03/07/13 2152 03/08/13 0126 03/08/13 0535  Weight: 75.751 kg (167 lb) 75.2 kg (165 lb 12.6 oz) 77.6 kg (171 lb 1.2 oz)    Exam:   General:  Alert   Cardiovascular: s1,s2 rrr  Respiratory: cta BL   Abdomen: soft, nt, nd   Musculoskeletal: no edema    Data Reviewed: Basic Metabolic Panel:  Recent  Labs Lab 03/07/13 2249 03/08/13 0836  NA 134* 137  K 4.0 4.7  CL 100 105  CO2 24 23  GLUCOSE 124* 92  BUN 19 17  CREATININE 1.19 1.10  CALCIUM 9.0 9.0  MG  --  2.1   Liver Function Tests:  Recent Labs Lab 03/08/13 0836  AST 30  ALT 28  ALKPHOS 67  BILITOT 0.4  PROT 6.4  ALBUMIN 2.7*   No results found for this basename: LIPASE, AMYLASE,  in the last 168 hours No results found for this basename: AMMONIA,  in the last 168 hours CBC:  Recent Labs Lab 03/07/13 2249 03/08/13 0836  WBC 7.6 8.3  NEUTROABS 5.3  --   HGB 5.3* 7.4*  HCT 17.7* 23.1*  MCV 75.0* 78.3  PLT 434* 340   Cardiac Enzymes:  Recent Labs Lab 03/07/13 2300  TROPONINI <0.30   BNP (last 3 results)  Recent Labs  01/14/13 1750  PROBNP 634.6*   CBG: No results found for this basename: GLUCAP,  in the last 168 hours  Recent Results (from the past 240 hour(s))  MRSA PCR SCREENING     Status: Abnormal   Collection Time    03/08/13  1:10 AM      Result Value Range Status   MRSA by PCR POSITIVE (*) NEGATIVE Final   Comment:            The GeneXpert MRSA Assay (FDA  approved for NASAL specimens     only), is one component of a     comprehensive MRSA colonization     surveillance program. It is not     intended to diagnose MRSA     infection nor to guide or     monitor treatment for     MRSA infections.     RESULT CALLED TO, READ BACK BY AND VERIFIED WITH:     GREY M AT 0302 ON [id number] BY FORSYTH K     Studies: No results found.  Scheduled Meds: . Chlorhexidine Gluconate Cloth  6 each Topical Q0600  . [START ON 03/09/2013] influenza vac split quadrivalent PF  0.5 mL Intramuscular Tomorrow-1000  . mupirocin ointment  1 application Nasal BID  . pantoprazole  40 mg Oral BID AC  . [START ON 03/09/2013] pneumococcal 23 valent vaccine  0.5 mL Intramuscular Tomorrow-1000  . sodium chloride  3 mL Intravenous Q12H   Continuous Infusions: . 0.9 % NaCl with KCl 20 mEq / L 100 mL/hr at  03/08/13 0900    Active Problems:   CAD (coronary artery disease)   GI bleed   Dementia   Small bowel mass   Lung mass    Time spent: > 35 minutes     Esperanza Sheets  [facility] Pager [phone]. If 7PM-7AM, please contact night-coverage at [email or web address], password [facility] 03/08/2013, 9:53 AM  LOS: 1 day

## 2013-03-08 NOTE — Discharge Summary (Signed)
Physician Discharge Summary  ABEM SHADDIX ZOX:[id number] DOB: [birth year] DOA: 03/07/2013  PCP: Colon Branch, MD  Admit date: 03/07/2013 Discharge date: 03/08/2013  Time spent: >35 minutes  Recommendations for Outpatient Follow-up:   F/u with GI, and surgery at [facility]; Dr. Darrick Penna kindly arranging follow up  F/u with PCP in 1-2 weeks  Discharge Diagnoses:  Active Problems:   CAD (coronary artery disease)   GI bleed   Dementia   Small bowel mass   Lung mass   Discharge Condition: stable   Diet recommendation: heart healthy   Filed Weights   03/08/13 0126 03/08/13 0535 03/08/13 1459  Weight: 75.2 kg (165 lb 12.6 oz) 77.6 kg (171 lb 1.2 oz) 173.6 kg (382 lb 11.5 oz)    History of present illness:  72 y/o male With PMH of CAD s/p , GI bleed, GERD, Hyperlipidemia, HTN, and H. Pylori gastritis, recently Dx with metastatic CA on 01/14/2013 (bronchogenic carcinoma with metastatic lesions, ? Small bowel lesion as well) but he refused endoscopy at that time accepted hospice care and discharged home; he presented yesterday with progressive anemia, and now wants evaluation and treatment fop the cancer;   Hospital Course:  1. Metastatic CA on 01/14/2013 (bronchogenic carcinoma with metastatic lesions, ? Small bowel lesion)  -Patient was seen evaluated by GI/Dr. Darrick Penna who contacted [facility] and arranging follow up with surgery and DBE; patient also needs bronchoscope outpatient and tissues biopsy;  -d/w with oncology f/u in 2 weeks  2. Acute blood loss anemia; GIB likely from underlying CA; no new obvious s/s of acute bleeding  -Hg improved 5.3-->7.4 after two units of PRBC   3. CAD s/p CABG stable; cont home regimen BB, not on antiplatelets due to GIB   Patient was cleared for d/c to f/u at [facility];   Procedures:  CXR (i.e. Studies not automatically included, echos, thoracentesis, etc; not x-rays)  Consultations:  GI, oncology   Discharge Exam: Filed Vitals:   03/08/13 1459  BP:  120/70  Pulse: 63  Temp: 98.6 F (37 C)  Resp: 16    General: alert Cardiovascular: s1,s2 rrr Respiratory: CTA BL   Discharge Instructions  Discharge Orders   Future Appointments Provider Department Dept Phone   03/09/2013 8:30 AM West Bali, MD [facility] [phone]   Future Orders Complete By Expires   Diet - low sodium heart healthy  As directed    Discharge instructions  As directed    Comments:     Follow up with gastroenterologist and surgeon at [facility] in 1 week   Increase activity slowly  As directed        Medication List         acetaminophen 500 MG tablet  Commonly known as:  TYLENOL  Take 500 mg by mouth every 6 (six) hours as needed for pain.     metoprolol tartrate 25 MG tablet  Commonly known as:  LOPRESSOR  Take 25 mg by mouth every evening.     mupirocin ointment 2 %  Commonly known as:  BACTROBAN  Apply 1 application topically 2 (two) times daily.     omeprazole 20 MG capsule  Commonly known as:  PRILOSEC  Take 20 mg by mouth 2 (two) times daily.     STOOL SOFTENER PO  Take 1 tablet by mouth 2 (two) times daily.       No Known Allergies     Follow-up Information   Follow up with [facility].  Schedule an appointment as soon as possible for a visit in 2 weeks. (MD appt to establish tx plan)    Contact information:   [address] [place] [address]       Follow up with Achilles Dunk, MD In 1 week.   Specialty:  Internal Medicine       The results of significant diagnostics from this hospitalization (including imaging, microbiology, ancillary and laboratory) are listed below for reference.    Significant Diagnostic Studies: No results found.  Microbiology: Recent Results (from the past 240 hour(s))  MRSA PCR SCREENING     Status: Abnormal   Collection Time    03/08/13  1:10 AM      Result Value Range Status   MRSA by PCR POSITIVE (*) NEGATIVE Final    Comment:            The GeneXpert MRSA Assay (FDA     approved for NASAL specimens     only), is one component of a     comprehensive MRSA colonization     surveillance program. It is not     intended to diagnose MRSA     infection nor to guide or     monitor treatment for     MRSA infections.     RESULT CALLED TO, READ BACK BY AND VERIFIED WITH:     GREY M AT 0302 ON [id number] BY FORSYTH K     Labs: Basic Metabolic Panel:  Recent Labs Lab 03/07/13 2249 03/08/13 0836  NA 134* 137  K 4.0 4.7  CL 100 105  CO2 24 23  GLUCOSE 124* 92  BUN 19 17  CREATININE 1.19 1.10  CALCIUM 9.0 9.0  MG  --  2.1   Liver Function Tests:  Recent Labs Lab 03/08/13 0836  AST 30  ALT 28  ALKPHOS 67  BILITOT 0.4  PROT 6.4  ALBUMIN 2.7*   No results found for this basename: LIPASE, AMYLASE,  in the last 168 hours No results found for this basename: AMMONIA,  in the last 168 hours CBC:  Recent Labs Lab 03/07/13 2249 03/08/13 0836  WBC 7.6 8.3  NEUTROABS 5.3  --   HGB 5.3* 7.4*  HCT 17.7* 23.1*  MCV 75.0* 78.3  PLT 434* 340   Cardiac Enzymes:  Recent Labs Lab 03/07/13 2300  TROPONINI <0.30   BNP: BNP (last 3 results)  Recent Labs  01/14/13 1750  PROBNP 634.6*   CBG: No results found for this basename: GLUCAP,  in the last 168 hours     Signed:  Esperanza Sheets  [facility] 03/08/2013, 5:29 PM

## 2013-03-08 NOTE — Progress Notes (Signed)
Patient ready for transfer to room 319. No acute distress noted.

## 2013-03-08 NOTE — Consult Note (Addendum)
REVIEWED. I spoke with pt's wife. Pt expressed he would like to have a workup. WIFE AGREED TO PROCEED WITH AN EVALUATION. INTERESTED IN STOPPING THE SMALL BOWEL BLEED AND WORKUP FOR LUNG MASS. DISCUSSED MANAGEMENT.PLAN WITH DRs: [facility] AND [facility]. PT NEED DOUBLE BALLOON ENTEROSCOPY TO ATTEMPT TO OBTAIN A TISSUES DIAGNOSIS AND STOP BLEEDING. WOULD LIKE TO AVOID SURGERY TO CONTROL BLEEDING IF POSSIBLE DUE TO PT'S CO-MORBIDITIES.  1641: SPOKE WITH DR. GILLIAM. FEELS PT NEEDS SURGICAL EVAL PRIOR TO DBE DUE TO PT'S BEING HIGH RISK FOR ANESTHESIA. IF PT NOT AN OPERATIVE CANDIDATE THEN HE RECOMMENDED NOT DOING DBE. DBE ARE NOT DONE WITH PROPOFOL.  OK TO D/C PT TO HOME OF HB STABLE. WILL ARRANGE FOR GENERAL SURGERY EVALUATION AT [facility] AS OP IN NEXT 7 DAYS. IF PT AN OPERATIVE CANDIDATE WILL ATTEMPT DBE FOLLOWED BY SB RESECTION AT [facility].

## 2013-03-08 NOTE — Care Management Note (Unsigned)
    Page 1 of 1   03/08/2013     2:29:28 PM   CARE MANAGEMENT NOTE 03/08/2013  Patient:  Jonathan Watson, Jonathan Watson   Account Number:  [id number]  Date Initiated:  03/08/2013  Documentation initiated by:  Sharrie Rothman  Subjective/Objective Assessment:   Pt admitted from home with rectal bleeding.Pt lives with his wife and will return home at discharge. Pt has new diagnosis of CA with mets and has referral for Hospice on last discharge but now wants treatment. Pt has been fairly independent     Action/Plan:   with ADL's. Pt has children who are very active in the care of the pt. Will continue to follow for any HH needs.   Anticipated DC Date:  03/10/2013   Anticipated DC Plan:  HOME W HOME HEALTH SERVICES      DC Planning Services  CM consult      Choice offered to / List presented to:             Status of service:  In process, will continue to follow Medicare Important Message given?   (If response is "NO", the following Medicare IM given date fields will be blank) Date Medicare IM given:   Date Additional Medicare IM given:    Discharge Disposition:  HOME W HOME HEALTH SERVICES  Per UR Regulation:    If discussed at Long Length of Stay Meetings, dates discussed:    Comments:  03/08/13 1430 Arlyss Queen, RN BSN CM

## 2013-03-09 ENCOUNTER — Ambulatory Visit: Payer: Medicare Other | Admitting: Gastroenterology

## 2013-03-09 ENCOUNTER — Encounter: Payer: Self-pay | Admitting: Gastroenterology

## 2013-03-09 ENCOUNTER — Telehealth: Payer: Self-pay | Admitting: Gastroenterology

## 2013-03-09 ENCOUNTER — Other Ambulatory Visit: Payer: Self-pay

## 2013-03-09 DIAGNOSIS — K922 Gastrointestinal hemorrhage, unspecified: Secondary | ICD-10-CM

## 2013-03-09 LAB — TYPE AND SCREEN: Unit division: 0

## 2013-03-09 NOTE — Telephone Encounter (Signed)
WIFE CALLED OCT 7 TO CANCEL.

## 2013-03-09 NOTE — Telephone Encounter (Signed)
Patient will see Dr. Flonnie Hailstone on October 21st at 1:00

## 2013-03-09 NOTE — Addendum Note (Signed)
Addended by: West Bali on: 03/09/2013 02:35 PM   Modules accepted: Orders

## 2013-03-09 NOTE — Telephone Encounter (Signed)
FAX ORDERS TO SHORT STAY. BLOOD DRAW ON MON. PLEASE CALL PT'S WIFE SO SHE KNOWS WHERE TO TAKE PT.

## 2013-03-09 NOTE — Telephone Encounter (Signed)
Records faxed to Dr. Flonnie Hailstone

## 2013-03-09 NOTE — Telephone Encounter (Signed)
Pt was a no show

## 2013-03-09 NOTE — Telephone Encounter (Signed)
His wife is aware

## 2013-03-09 NOTE — Telephone Encounter (Signed)
Fax orders to short stay to [facility] at [phone]. Per [facility] told pt's wife to have him at Short Stay on mon at 9:00 am. She said Dr. Darrick Penna said pt has appt at [facility] on 03/18/2013 but she did not say what time. She would like to know what time. I told her we will check with Soledad Gerlach and let her know.

## 2013-03-09 NOTE — Telephone Encounter (Signed)
CALLED AND SPOKE TO DR. SHEN. REVIEWED HX & REASON FOR EVALUATION. PT PROBABLY NEEDS A SMALL BOWEL RESECTION AND HAS BEEN CONSIDERED HIGH RISK FOR ANESTHESIA. FAMILY CHANGED MIND YESTERDAY RE: TREATMENT. WOULD LIKE TO STOP THE BLEEDING AND WOULD CONSIDER TREATMENT FOR LU NG MASS.DR. Flonnie Hailstone,  [phone], WILL SEE PT OCT 17. RECORDS/IMAGING REPORTS TO BE FAXED. FAX THE FOLLOWING: CONSULT SLF 8/16, CONSULT 8/24 KO/AR, CONSULT AS/SLF 10/6, DISCHARGE SUMMARY: 8/15, 8/24, 9/18, 10/6, EGD/TCS AUG 2014, PATH: 8/16, AND CT REPORT: 8/15, 8/17.  CALLED WIFE EXPLAINED PLAN. PT NEEDS CBC MON OCT 13 AS WELL AS TYPE AND SCREEN. WILL NEED LABS DRAWN IN SHORT STAY. WIFE WILL PICK UP FILMS ON CD MON AS WELL. WIFE WILL CALL WITH QUESTIONS.

## 2013-03-14 ENCOUNTER — Encounter (HOSPITAL_COMMUNITY)
Admission: RE | Admit: 2013-03-14 | Discharge: 2013-03-14 | Disposition: A | Discharge status: Home / Self Care | Payer: Medicare Other | Source: Ambulatory Visit | Attending: Gastroenterology | Admitting: Gastroenterology

## 2013-03-14 DIAGNOSIS — D62 Acute posthemorrhagic anemia: Secondary | ICD-10-CM | POA: Insufficient documentation

## 2013-03-14 LAB — HEMOGLOBIN AND HEMATOCRIT, BLOOD
HCT: 26.9 % — ABNORMAL LOW (ref 39.0–52.0)
Hemoglobin: 8 g/dL — ABNORMAL LOW (ref 13.0–17.0)

## 2013-03-14 LAB — PREPARE RBC (CROSSMATCH)

## 2013-03-14 NOTE — Progress Notes (Signed)
Results for Jonathan Watson, Jonathan Watson (MRN [id number]) as of 03/14/2013 09:33  Verbally results given to Dr. Darrick Penna. Orders received to give 1  Unit PC in the am. Results faxed to Dr. Darrick Penna  Ref. Range 03/14/2013 08:10  Hemoglobin Latest Range: 13.0-17.0 g/dL 8.0 (L)  HCT Latest Range: 39.0-52.0 % 26.9 (L)

## 2013-03-15 ENCOUNTER — Other Ambulatory Visit (HOSPITAL_COMMUNITY): Payer: Medicare Other

## 2013-03-15 ENCOUNTER — Encounter (HOSPITAL_COMMUNITY)
Admission: RE | Admit: 2013-03-15 | Discharge: 2013-03-15 | Disposition: A | Discharge status: Home / Self Care | Payer: Medicare Other | Source: Ambulatory Visit | Attending: Gastroenterology | Admitting: Gastroenterology

## 2013-03-15 MED ORDER — SODIUM CHLORIDE 0.9 % IV SOLN
500.0000 mL | Freq: Once | INTRAVENOUS | Status: AC
  Administered 2013-03-15: 1000 mL via INTRAVENOUS

## 2013-03-15 NOTE — Progress Notes (Signed)
1 unit PC given without reaction. Tolerated well.

## 2013-03-16 LAB — TYPE AND SCREEN
Antibody Screen: NEGATIVE
Unit division: 0

## 2013-03-25 ENCOUNTER — Ambulatory Visit: Payer: Medicare Other | Admitting: Internal Medicine

## 2013-03-29 ENCOUNTER — Other Ambulatory Visit (HOSPITAL_COMMUNITY): Payer: Self-pay | Admitting: Oncology

## 2013-03-30 ENCOUNTER — Encounter (HOSPITAL_BASED_OUTPATIENT_CLINIC_OR_DEPARTMENT_OTHER): Payer: Medicare Other

## 2013-03-30 ENCOUNTER — Encounter (HOSPITAL_COMMUNITY): Payer: Self-pay

## 2013-03-30 VITALS — BP 153/80 | HR 93 | Temp 97.6°F | Resp 20 | Ht 67.0 in | Wt 164.0 lb

## 2013-03-30 DIAGNOSIS — A048 Other specified bacterial intestinal infections: Secondary | ICD-10-CM

## 2013-03-30 DIAGNOSIS — Q2733 Arteriovenous malformation of digestive system vessel: Secondary | ICD-10-CM

## 2013-03-30 DIAGNOSIS — K5520 Angiodysplasia of colon without hemorrhage: Secondary | ICD-10-CM

## 2013-03-30 DIAGNOSIS — R918 Other nonspecific abnormal finding of lung field: Secondary | ICD-10-CM

## 2013-03-30 DIAGNOSIS — B9681 Helicobacter pylori [H. pylori] as the cause of diseases classified elsewhere: Secondary | ICD-10-CM

## 2013-03-30 DIAGNOSIS — D500 Iron deficiency anemia secondary to blood loss (chronic): Secondary | ICD-10-CM | POA: Insufficient documentation

## 2013-03-30 DIAGNOSIS — D62 Acute posthemorrhagic anemia: Secondary | ICD-10-CM

## 2013-03-30 DIAGNOSIS — K6389 Other specified diseases of intestine: Secondary | ICD-10-CM

## 2013-03-30 DIAGNOSIS — K922 Gastrointestinal hemorrhage, unspecified: Secondary | ICD-10-CM

## 2013-03-30 DIAGNOSIS — R222 Localized swelling, mass and lump, trunk: Secondary | ICD-10-CM

## 2013-03-30 LAB — COMPREHENSIVE METABOLIC PANEL
ALT: 26 U/L (ref 0–53)
Albumin: 2.9 g/dL — ABNORMAL LOW (ref 3.5–5.2)
BUN: 14 mg/dL (ref 6–23)
CO2: 26 mEq/L (ref 19–32)
Calcium: 9.3 mg/dL (ref 8.4–10.5)
Chloride: 101 mEq/L (ref 96–112)
Creatinine, Ser: 1.09 mg/dL (ref 0.50–1.35)
GFR calc non Af Amer: 65 mL/min — ABNORMAL LOW (ref >90)
Sodium: 136 mEq/L (ref 135–145)
Total Bilirubin: 0.2 mg/dL — ABNORMAL LOW (ref 0.3–1.2)

## 2013-03-30 LAB — CBC WITH DIFFERENTIAL/PLATELET
Basophils Absolute: 0.1 10*3/uL (ref 0.0–0.1)
Basophils Relative: 1 % (ref 0–1)
Eosinophils Absolute: 0.2 10*3/uL (ref 0.0–0.7)
Hemoglobin: 7.5 g/dL — ABNORMAL LOW (ref 13.0–17.0)
Lymphocytes Relative: 15 % (ref 12–46)
Lymphs Abs: 1 10*3/uL (ref 0.7–4.0)
MCH: 25.3 pg — ABNORMAL LOW (ref 26.0–34.0)
MCHC: 30.4 g/dL (ref 30.0–36.0)
Monocytes Absolute: 0.6 10*3/uL (ref 0.1–1.0)
Monocytes Relative: 9 % (ref 3–12)
Neutrophils Relative %: 72 % (ref 43–77)
RDW: 18.9 % — ABNORMAL HIGH (ref 11.5–15.5)
WBC Morphology: INCREASED

## 2013-03-30 LAB — FERRITIN: Ferritin: 99 ng/mL (ref 22–322)

## 2013-03-30 LAB — LACTATE DEHYDROGENASE: LDH: 121 U/L (ref 94–250)

## 2013-03-30 LAB — RETICULOCYTES: RBC.: 2.96 MIL/uL — ABNORMAL LOW (ref 4.22–5.81)

## 2013-03-30 LAB — PREPARE RBC (CROSSMATCH)

## 2013-03-30 NOTE — Progress Notes (Signed)
[facility]  OFFICE PROGRESS NOTE  Colon Branch, MD [address] [place] [address]  DIAGNOSIS: Lung mass - Plan: CBC with Differential, Comprehensive metabolic panel, Lactate dehydrogenase, CBC with Differential, CBC with Differential, Comprehensive metabolic panel, Comprehensive metabolic panel, Lactate dehydrogenase, Lactate dehydrogenase  Small bowel thickening X 2 on CT scan  Acute blood loss anemia - Plan: Reticulocytes, Ferritin, Ferritin, Ferritin, Reticulocytes, Reticulocytes  GI bleed - Plan: 0.9 %  sodium chloride infusion, sodium chloride 0.9 % injection 10 mL, heparin lock flush 100 unit/mL, heparin lock flush 100 unit/mL, sodium chloride 0.9 % injection 3 mL, Type and screen, CBC, Prepare RBC, acetaminophen (TYLENOL) tablet 650 mg, diphenhydrAMINE (BENADRYL) capsule 25 mg, furosemide (LASIX) injection 20 mg  Anemia due to blood loss, chronic  Helicobacter pylori gastritis (chronic gastritis)  Arteriovenous malformation of gastrointestinal tract  Chief Complaint  Patient presents with  . Lung Lesion  . thickened small bowel X 2 areas  . Anemia    acute and chronic GI blood loss    CURRENT THERAPY: None.  INTERVAL HISTORY: Jonathan Watson 72 y.o. male returns for followup after recent hospitalization for acute blood loss anemia for which transfusion was accomplished. He was found to have a lung mass on CT scan of the chest along with 2 small areas in the small intestine with a thickened wall. He is here today for more definitive evaluation and recommendations regarding management. His discharge hemoglobin was 8.0 having presented with a hemoglobin of 5.3. He continues to be short of breath with mild exertion along with passing black stools. He denies any nausea, vomiting, anorexia, abdominal pain, cough, wheezing, chest pain, bone pain, lower extremity swelling or redness, skin rash, headache, or seizures.  MEDICAL  HISTORY: Past Medical History  Diagnosis Date  . MI (myocardial infarction)   . Hypertension   . Hyperlipidemia   . Coronary artery disease   . Dementia 01/24/2013  . GERD (gastroesophageal reflux disease)   . Blood transfusion without reported diagnosis     INTERIM HISTORY: has Acute blood loss anemia; Upper GI bleed; CAD (coronary artery disease); GERD (gastroesophageal reflux disease); Hyperlipidemia; Dark stools; Dizziness; GI bleed; Dementia; Essential hypertension, benign; Other and unspecified hyperlipidemia; Small bowel mass; Helicobacter pylori gastritis (chronic gastritis); Angina pectoris; Lung mass; Bowel wall thickening; Anemia due to blood loss, chronic; and Arteriovenous malformation of gastrointestinal tract on his problem list.   This patient was admitted from 01/14/2013- 01/17/2013 for upper gi bleed. He was subsequently admitted from 01/23/2013- 01/25/2013 for GI bleed. Hospice was recommended at that time as he did not want to pursue any intervention for her imaging abnormalities. Additionally, he was admitted for acute blood loss on 02/17/2013-02/18/2013  ALLERGIES:  has No Known Allergies.  MEDICATIONS: has a current medication list which includes the following prescription(s): acetaminophen, metoprolol tartrate, omeprazole, docusate calcium, and mupirocin ointment.  SURGICAL HISTORY:  Past Surgical History  Procedure Laterality Date  . Coronary artery bypass graft    . Cervical spine surgery    . Esophagogastroduodenoscopy N/A 01/15/2013    Dr. 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 Colonoscopy N/A 01/17/2013    Dr. Darrick Penna: normal TI, normal colon, small internal hemorrhoids  . Appendectomy      FAMILY HISTORY: family history includes Hypertension in his father and mother. There is no history of Colon cancer.  SOCIAL HISTORY:  reports that he quit smoking about  14 years ago. His smoking use included Cigarettes. He has a 37.5  pack-year smoking history. His smokeless tobacco use includes Snuff. He reports that he does not drink alcohol or use illicit drugs.  REVIEW OF SYSTEMS:  Other than that discussed above is noncontributory.  PHYSICAL EXAMINATION: ECOG PERFORMANCE STATUS: 2 - Symptomatic, <50% confined to bed  Blood pressure 153/80, pulse 93, temperature 97.6 F (36.4 C), temperature source Oral, resp. rate 20, height 5\' 7"  (1.702 m), weight 164 lb (74.39 kg), SpO2 100.00%.  GENERAL:alert, no distress and comfortable SKIN:  texture, turgor are normal, no rashes or significant lesions pallor present. EYES: PERLA; Conjunctiva are pale and non-injected, sclera clear OROPHARYNX:no exudate, no erythema on lips, buccal mucosa, or tongue. NECK: supple, thyroid normal size, non-tender, without nodularity. No masses CHEST: Increased AP diameter with no gynecomastia. LYMPH:  no palpable lymphadenopathy in the cervical, axillary or inguinal LUNGS: clear to auscultation and percussion with normal breathing effort HEART: regular rate & rhythm and no murmurs and no lower extremity edema ABDOMEN:abdomen soft, non-tender and normal bowel sounds MUSCULOSKELETAL:no cyanosis of digits and no clubbing. Range of motion normal.  NEURO: alert & oriented x 3 with fluent speech, no focal motor/sensory deficits   LABORATORY DATA: Office Visit on 03/30/2013  Component Date Value Range Status  . WBC 03/30/2013 6.4  4.0 - 10.5 K/uL Final  . RBC 03/30/2013 2.96* 4.22 - 5.81 MIL/uL Final  . Hemoglobin 03/30/2013 7.5* 13.0 - 17.0 g/dL Final  . HCT 09/81/1914 24.7* 39.0 - 52.0 % Final  . MCV 03/30/2013 83.4  78.0 - 100.0 fL Final  . MCH 03/30/2013 25.3* 26.0 - 34.0 pg Final  . MCHC 03/30/2013 30.4  30.0 - 36.0 g/dL Final  . RDW 78/29/5621 18.9* 11.5 - 15.5 % Final  . Platelets 03/30/2013 412* 150 - 400 K/uL Final  . Neutrophils Relative % 03/30/2013 PENDING  43 - 77 % Incomplete  . Neutro Abs 03/30/2013 PENDING  1.7 - 7.7 K/uL  Incomplete  . Band Neutrophils 03/30/2013 PENDING  0 - 10 % Incomplete  . Lymphocytes Relative 03/30/2013 PENDING  12 - 46 % Incomplete  . Lymphs Abs 03/30/2013 PENDING  0.7 - 4.0 K/uL Incomplete  . Monocytes Relative 03/30/2013 PENDING  3 - 12 % Incomplete  . Monocytes Absolute 03/30/2013 PENDING  0.1 - 1.0 K/uL Incomplete  . Eosinophils Relative 03/30/2013 PENDING  0 - 5 % Incomplete  . Eosinophils Absolute 03/30/2013 PENDING  0.0 - 0.7 K/uL Incomplete  . Basophils Relative 03/30/2013 PENDING  0 - 1 % Incomplete  . Basophils Absolute 03/30/2013 PENDING  0.0 - 0.1 K/uL Incomplete  . WBC Morphology 03/30/2013 PENDING   Incomplete  . RBC Morphology 03/30/2013 PENDING   Incomplete  . Smear Review 03/30/2013 PENDING   Incomplete  . nRBC 03/30/2013 PENDING  0 /100 WBC Incomplete  . Metamyelocytes Relative 03/30/2013 PENDING   Incomplete  . Myelocytes 03/30/2013 PENDING   Incomplete  . Promyelocytes Absolute 03/30/2013 PENDING   Incomplete  . Blasts 03/30/2013 PENDING   Incomplete  . Retic Ct Pct 03/30/2013 3.2* 0.4 - 3.1 % Final  . RBC. 03/30/2013 2.96* 4.22 - 5.81 MIL/uL Final  . Retic Count, Manual 03/30/2013 94.7  19.0 - 186.0 K/uL Final  Hospital Outpatient Visit on 03/14/2013  Component Date Value Range Status  . Hemoglobin 03/14/2013 8.0* 13.0 - 17.0 g/dL Final  . HCT 30/86/5784 26.9* 39.0 - 52.0 % Final  . ABO/RH(D) 03/14/2013 O POS   Final  . Antibody  Screen 03/14/2013 NEG   Final  . Sample Expiration 03/14/2013 03/17/2013   Final  . Unit Number 03/14/2013 [id number]   Final  . Blood Component Type 03/14/2013 RED CELLS,LR   Final  . Unit division 03/14/2013 00   Final  . Status of Unit 03/14/2013 ISSUED,FINAL   Final  . Transfusion Status 03/14/2013 OK TO TRANSFUSE   Final  . Crossmatch Result 03/14/2013 Compatible   Final  . Order Confirmation 03/14/2013 ORDER PROCESSED BY BLOOD BANK   Final  Admission on 03/07/2013, Discharged on 03/08/2013  Component Date Value Range  Status  . WBC 03/07/2013 7.6  4.0 - 10.5 K/uL Final  . RBC 03/07/2013 2.36* 4.22 - 5.81 MIL/uL Final  . Hemoglobin 03/07/2013 5.3* 13.0 - 17.0 g/dL Final   Comment: RESULT REPEATED AND VERIFIED                          CRITICAL RESULT CALLED TO, READ BACK BY AND VERIFIED WITH:                          K. CHURCHILL AT 2314 ON 03/07/13 BY S. VANHOORNE   . HCT 03/07/2013 17.7* 39.0 - 52.0 % Final  . MCV 03/07/2013 75.0* 78.0 - 100.0 fL Final  . MCH 03/07/2013 22.5* 26.0 - 34.0 pg Final  . MCHC 03/07/2013 29.9* 30.0 - 36.0 g/dL Final  . RDW 11/91/4782 19.0* 11.5 - 15.5 % Final  . Platelets 03/07/2013 434* 150 - 400 K/uL Final  . Neutrophils Relative % 03/07/2013 71  43 - 77 % Final  . Lymphocytes Relative 03/07/2013 13  12 - 46 % Final  . Monocytes Relative 03/07/2013 10  3 - 12 % Final  . Eosinophils Relative 03/07/2013 5  0 - 5 % Final  . Basophils Relative 03/07/2013 1  0 - 1 % Final  . Neutro Abs 03/07/2013 5.3  1.7 - 7.7 K/uL Final  . Lymphs Abs 03/07/2013 1.0  0.7 - 4.0 K/uL Final  . Monocytes Absolute 03/07/2013 0.8  0.1 - 1.0 K/uL Final  . Eosinophils Absolute 03/07/2013 0.4  0.0 - 0.7 K/uL Final  . Basophils Absolute 03/07/2013 0.1  0.0 - 0.1 K/uL Final  . RBC Morphology 03/07/2013 POLYCHROMASIA PRESENT   Final  . Sodium 03/07/2013 134* 135 - 145 mEq/L Final  . Potassium 03/07/2013 4.0  3.5 - 5.1 mEq/L Final  . Chloride 03/07/2013 100  96 - 112 mEq/L Final  . CO2 03/07/2013 24  19 - 32 mEq/L Final  . Glucose, Bld 03/07/2013 124* 70 - 99 mg/dL Final  . BUN 95/62/1308 19  6 - 23 mg/dL Final  . Creatinine, Ser 03/07/2013 1.19  0.50 - 1.35 mg/dL Final  . Calcium 65/78/4696 9.0  8.4 - 10.5 mg/dL Final  . GFR calc non Af Amer 03/07/2013 59* >90 mL/min Final  . GFR calc Af Amer 03/07/2013 68* >90 mL/min Final   Comment: (NOTE)                          The eGFR has been calculated using the CKD EPI equation.                          This calculation has not been validated in all  clinical situations.  eGFR's persistently <90 mL/min signify possible Chronic Kidney                          Disease.  . Troponin I 03/07/2013 <0.30  <0.30 ng/mL Final   Comment:                                 Due to the release kinetics of cTnI,                          a negative result within the first hours                          of the onset of symptoms does not rule out                          myocardial infarction with certainty.                          If myocardial infarction is still suspected,                          repeat the test at appropriate intervals.  . Order Confirmation 03/07/2013 ORDER PROCESSED BY BLOOD BANK   Final  . ABO/RH(D) 03/07/2013 O POS   Final  . Antibody Screen 03/07/2013 NEG   Final  . Sample Expiration 03/07/2013 03/10/2013   Final  . Unit Number 03/07/2013 [id number]   Final  . Blood Component Type 03/07/2013 RED CELLS,LR   Final  . Unit division 03/07/2013 00   Final  . Status of Unit 03/07/2013 ISSUED,FINAL   Final  . Transfusion Status 03/07/2013 OK TO TRANSFUSE   Final  . Crossmatch Result 03/07/2013 Compatible   Final  . Unit Number 03/07/2013 [id number]   Final  . Blood Component Type 03/07/2013 RED CELLS,LR   Final  . Unit division 03/07/2013 00   Final  . Status of Unit 03/07/2013 ISSUED,FINAL   Final  . Transfusion Status 03/07/2013 OK TO TRANSFUSE   Final  . Crossmatch Result 03/07/2013 Compatible   Final  . MRSA by PCR 03/08/2013 POSITIVE* NEGATIVE Final   Comment:                                 The GeneXpert MRSA Assay (FDA                          approved for NASAL specimens                          only), is one component of a                          comprehensive MRSA colonization                          surveillance program. It is not                          intended to diagnose MRSA  infection nor to guide or                          monitor treatment for                           MRSA infections.                          RESULT CALLED TO, READ BACK BY AND VERIFIED WITH:                          GREY M AT 0302 ON [id number] BY FORSYTH K  . Magnesium 03/08/2013 2.1  1.5 - 2.5 mg/dL Final  . aPTT 56/21/3086 34  24 - 37 seconds Final  . Prothrombin Time 03/08/2013 15.6* 11.6 - 15.2 seconds Final  . INR 03/08/2013 1.27  0.00 - 1.49 Final  . WBC 03/08/2013 8.3  4.0 - 10.5 K/uL Final  . RBC 03/08/2013 2.95* 4.22 - 5.81 MIL/uL Final  . Hemoglobin 03/08/2013 7.4* 13.0 - 17.0 g/dL Final   DELTA CHECK NOTED  . HCT 03/08/2013 23.1* 39.0 - 52.0 % Final  . MCV 03/08/2013 78.3  78.0 - 100.0 fL Final  . MCH 03/08/2013 25.1* 26.0 - 34.0 pg Final  . MCHC 03/08/2013 32.0  30.0 - 36.0 g/dL Final  . RDW 57/84/6962 18.7* 11.5 - 15.5 % Final  . Platelets 03/08/2013 340  150 - 400 K/uL Final  . Sodium 03/08/2013 137  135 - 145 mEq/L Final  . Potassium 03/08/2013 4.7  3.5 - 5.1 mEq/L Final  . Chloride 03/08/2013 105  96 - 112 mEq/L Final  . CO2 03/08/2013 23  19 - 32 mEq/L Final  . Glucose, Bld 03/08/2013 92  70 - 99 mg/dL Final  . BUN 95/28/4132 17  6 - 23 mg/dL Final  . Creatinine, Ser 03/08/2013 1.10  0.50 - 1.35 mg/dL Final  . Calcium 44/06/270 9.0  8.4 - 10.5 mg/dL Final  . Total Protein 03/08/2013 6.4  6.0 - 8.3 g/dL Final  . Albumin 53/66/4403 2.7* 3.5 - 5.2 g/dL Final  . AST 47/42/5956 30  0 - 37 U/L Final  . ALT 03/08/2013 28  0 - 53 U/L Final  . Alkaline Phosphatase 03/08/2013 67  39 - 117 U/L Final  . Total Bilirubin 03/08/2013 0.4  0.3 - 1.2 mg/dL Final  . GFR calc non Af Amer 03/08/2013 65* >90 mL/min Final  . GFR calc Af Amer 03/08/2013 75* >90 mL/min Final   Comment: (NOTE)                          The eGFR has been calculated using the CKD EPI equation.                          This calculation has not been validated in all clinical situations.                          eGFR's persistently <90 mL/min signify possible Chronic Kidney                           Disease.  . Vitamin B-12 03/07/2013 290  211 - 911 pg/mL Final  Performed at [facility]  . Folate 03/07/2013 >20.0   Final   Comment: (NOTE)                          Reference Ranges                                 Deficient:       0.4 - 3.3 ng/mL                                 Indeterminate:   3.4 - 5.4 ng/mL                                 Normal:              > 5.4 ng/mL                          Performed at [facility]  . Iron 03/07/2013 14* 42 - 135 ug/dL Final  . TIBC 82/95/6213 365  215 - 435 ug/dL Final  . Saturation Ratios 03/07/2013 4* 20 - 55 % Final  . UIBC 03/07/2013 351  125 - 400 ug/dL Final   Performed at [facility]  . Ferritin 03/07/2013 7* 22 - 322 ng/mL Final   Performed at [facility]  . Retic Ct Pct 03/07/2013 3.7* 0.4 - 3.1 % Final  . RBC. 03/07/2013 2.37* 4.22 - 5.81 MIL/uL Final  . Retic Count, Manual 03/07/2013 87.7  19.0 - 186.0 K/uL Final  . Fecal Occult Bld 03/07/2013 POSITIVE* NEGATIVE Final  . Fecal Occult Bld 03/07/2013 POSITIVE* NEGATIVE Final  . Hemoglobin A1C 03/07/2013 5.3  <5.7 % Final   Comment: (NOTE)                                                                                                                         According to the ADA Clinical Practice Recommendations for 2011, when                          HbA1c is used as a screening test:                           >=6.5%   Diagnostic of Diabetes Mellitus                                    (if abnormal result is confirmed)                          5.7-6.4%  Increased risk of developing Diabetes Mellitus                          References:Diagnosis and Classification of Diabetes Mellitus,Diabetes                          Care,2011,34(Suppl 1):S62-S69 and Standards of Medical Care in                                  Diabetes - 2011,Diabetes Care,2011,34 (Suppl 1):S11-S61.  . Mean Plasma Glucose 03/07/2013 105  <117 mg/dL Final   Performed at  [facility]  . TSH 03/07/2013 2.880  0.350 - 4.500 uIU/mL Final   Performed at [facility]    PATHOLOGY: 1594) Patient: Jonathan Watson, Jonathan Watson Collected: 01/15/2013 Client: [facility] Accession: [id number] Received: 01/17/2013 Jonette Eva, MD DOB: [birth year] Age: 62 Gender: M Reported: 01/18/2013 [address] Patient Ph: [phone] MRN #: [id number] [place] Visit #: [id number] Chart #: Phone: Fax: CC: REPORT OF SURGICAL PATHOLOGY FINAL DIAGNOSIS Diagnosis Stomach, biopsy - MODERATE CHRONIC ACTIVE H PYLORI GASTRITIS (ANTRAL AND OXYNTIC MUCOSA). - NEGATIVE FOR INTESTINAL METAPLASIA, DYSPLASIA OR MALIGNANCY. [place] RUND DO Pathologist, Electronic Signature (Case signed 01/18/2013) Specimen Gross and Clinical Information Specimen(s) Obtained: Stomach, biopsy Specimen Clinical Information Pre-op: melena, anemia; Post-op: gastric AVM; gastritis, hiatal hernia Gross Received in formalin are tan, soft tissue fragments that are submitted in toto. Number: multiple, Size: range from 0.1 to 0.4 cm. (KL:gt, 01/17/13) Stain(s) used in Diagnosis: The following stain(s) were used in diagnosing the case: Warthin-Starry Stain. The control(s) stained appropriately. Report signed out from the following location(s) Technical Component performed at [facility]. [address],[place] [address].CLIA:[id number]., Technical Component performed at [facility] [address] [place], [place], [place] [address]. CLIA #: [id number], Interpretation performed at [facility] [address] [place], [place], [place] [address]. CLIA #: [id number], Urinalysis    Component Value Date/Time   COLORURINE YELLOW 01/14/2013 1938   APPEARANCEUR CLEAR 01/14/2013 1938   LABSPEC 1.015 01/14/2013 1938   PHURINE 5.5 01/14/2013 1938   GLUCOSEU NEGATIVE 01/14/2013 1938   HGBUR NEGATIVE 01/14/2013 1938   BILIRUBINUR NEGATIVE 01/14/2013  1938   KETONESUR NEGATIVE 01/14/2013 1938   PROTEINUR NEGATIVE 01/14/2013 1938   UROBILINOGEN 0.2 01/14/2013 1938   NITRITE NEGATIVE 01/14/2013 1938   LEUKOCYTESUR NEGATIVE 01/14/2013 1938    RADIOGRAPHIC STUDIES: No results found.  ASSESSMENT:  #1. Acute on chronic blood loss anemia secondary to GI pathology, AVMs, possible malignancy of the small intestine as evidence by small bowel thickening in 2 areas on CT scan. #2. Lung mass highly suggestive of malignancy. #3. Obstructive pulmonary disease. #4. Coronary artery disease with no evidence of dysrhythmia or heart failure at this time. #5 per hypertension, controlled. #6. History of H. pylori gastritis, treated in August 2014. #7. Mild dementia, here today with his daughter and son.   PLAN:  #1. Type and crossmatch and transfuse 2 units of packed red blood cells on 04/01/2013. IV very also be given if ferritin is low. #2. Appointment tomorrow at [facility] for small bowel enteroscopy. #3. PET CT scan. #4. Continue current medications. #5. Followup after PET/CT scan is completed.   All questions were answered. The patient knows to call the clinic with any problems, questions or concerns. We can certainly see  the patient much sooner if necessary.   I spent 25 minutes counseling the patient face to face. The total time spent in the appointment was 30 minutes.    Maurilio Lovely, MD 03/30/2013 11:58 AM

## 2013-03-30 NOTE — Patient Instructions (Signed)
[facility] Discharge Instructions  RECOMMENDATIONS MADE BY THE CONSULTANT AND ANY TEST RESULTS WILL BE SENT TO YOUR REFERRING PHYSICIAN.  EXAM FINDINGS BY THE PHYSICIAN TODAY AND SIGNS OR SYMPTOMS TO REPORT TO CLINIC OR PRIMARY PHYSICIAN:   We want you to have a PET scan. This is performed @ [facility] (Radiology Department). Attached to these discharge papers is a teaching sheet on a PET scan. We are wanting to see what is going on inside your lung specifically in regards to the mass that is present. Anticipate being there anywhere from 1 to 1.5 hours.   Before the PET scan, nothing to eat or drink 6 hours prior and NO hard candy/gum etc either.    Labs today and if your hemoglobin is low and ferritin is low we will give you both blood and iron on Friday.  Return to see Dr Zigmund Daniel or Elijah Birk after PET scan.  Thank you for choosing [facility] to provide your oncology and hematology care.  To afford each patient quality time with our providers, please arrive at least 15 minutes before your scheduled appointment time.  With your help, our goal is to use those 15 minutes to complete the necessary work-up to ensure our physicians have the information they need to help with your evaluation and healthcare recommendations.    Effective January 1st, 2014, we ask that you re-schedule your appointment with our physicians should you arrive 10 or more minutes late for your appointment.  We strive to give you quality time with our providers, and arriving late affects you and other patients whose appointments are after yours.    Again, thank you for choosing [facility].  Our hope is that these requests will decrease the amount of time that you wait before being seen by our physicians.       _____________________________________________________________  Should you have questions after your visit to [facility], please contact our office  at [phone] between the hours of 8:30 a.m. and 5:00 p.m.  Voicemails left after 4:30 p.m. will not be returned until the following business day.  For prescription refill requests, have your pharmacy contact our office with your prescription refill request.    Positron Emission Tomography (PET Scan) PET stands for positron emission tomography. This is a test similar to an X-ray. Pictures can be taken of a body part after injection of a very small dose of a chemical called a radionuclide. This is combined with sugar, water, or ammonia to give off tiny particles called positrons. The positrons emitted are like small bursts of energy that can be detected by a scanner. They are processed by a computer to create images. These images can be used to study different diseases. They are often used to study cancer and cancer therapy. A scan of the entire body can be done and used to study all its parts. Because this test is tagged to a sugar used by cells, the bursts of energy show up differently in cells that use sugar faster. The computer is able to produce a color-coded picture based on this. The colors and amount of brightness on a PET image show different levels of tissue or organ function. For example, a cancer grows faster than healthy tissue and uses more sugar than normal tissue. It will absorb more of the substance injected. This causes it to appear brighter than normal tissue on the PET image. A specialist will read and explain  the images. Other examinations, such as recent CT (or CAT) scans or MRI scans may help with interpretation and should be brought along. There are usually no restrictions after the test. You should drink plenty of fluids to flush the radioactive substance from your body. BEFORE THE PROCEDURE   PET is usually an outpatient procedure. Wear comfortable, loose-fitting clothes.  Do not eat for four hours before the scan. You will be encouraged to drink water.  Your caregiver will  instruct you regarding the use of medications before the test.  Note: Diabetic patients should ask for any specific diet guidelines to control glucose (sugar) levels during the day of the test. There are limitations with the test if your blood sugar is not controlled during or before the test.  Be on time because of the rapid decay of the radioactive material that must be injected. PROCEDURE  Before the procedure begins a small amount of harmless radioactive material will be injected into a vein. This means you will have a needle stick. It will take from 30 minutes to one hour for the material to travel around your body in preparation for the scan. You will lie on a cushioned table and be moved through the center of a machine that looks like a large doughnut. This is the machine that detects the positrons. It is connected to a computer that produces images that can be viewed on a monitor. This will take about 30 minutes to an hour, during which you must remain still. Let your caregiver know if this will be difficult for you. Also, let your caregiver know if you need a sedative or help dealing with claustrophobia (feeling uncomfortable in enclosed spaces). HOME CARE INSTRUCTIONS   For the protection of your privacy, test results can not be given over the phone. Make sure you receive the results of your test. Ask as to how these results are to be obtained if you have not been informed. It is your responsibility to obtain your test results.  Drink several 8-once glasses of water following the test to flush the small amount of radioactive material out of your body.  Keep your follow-up appointments. Document Released: 11/23/2002 Document Revised: 08/11/2011 Document Reviewed: 05/19/2005 [facility] Patient Information 2014 [place], [place].

## 2013-03-30 NOTE — Addendum Note (Signed)
Addended by: Edythe Lynn A on: 03/30/2013 12:43 PM   Modules accepted: Orders

## 2013-03-30 NOTE — Addendum Note (Signed)
Addended by: Oda Kilts on: 03/30/2013 02:39 PM   Modules accepted: Orders, SmartSet

## 2013-04-01 ENCOUNTER — Encounter (HOSPITAL_BASED_OUTPATIENT_CLINIC_OR_DEPARTMENT_OTHER): Payer: Medicare Other

## 2013-04-01 VITALS — BP 113/61 | HR 74 | Temp 98.1°F | Resp 16

## 2013-04-01 DIAGNOSIS — K922 Gastrointestinal hemorrhage, unspecified: Secondary | ICD-10-CM

## 2013-04-01 MED ORDER — SODIUM CHLORIDE 0.9 % IV SOLN
250.0000 mL | Freq: Once | INTRAVENOUS | Status: AC
  Administered 2013-04-01: 250 mL via INTRAVENOUS

## 2013-04-01 MED ORDER — DIPHENHYDRAMINE HCL 25 MG PO CAPS
25.0000 mg | ORAL_CAPSULE | Freq: Once | ORAL | Status: AC
  Administered 2013-04-01: 25 mg via ORAL
  Filled 2013-04-01: qty 1

## 2013-04-01 MED ORDER — ACETAMINOPHEN 325 MG PO TABS
650.0000 mg | ORAL_TABLET | Freq: Once | ORAL | Status: AC
  Administered 2013-04-01: 650 mg via ORAL
  Filled 2013-04-01: qty 2

## 2013-04-01 MED ORDER — SODIUM CHLORIDE 0.9 % IJ SOLN
10.0000 mL | INTRAMUSCULAR | Status: AC | PRN
  Administered 2013-04-01: 10 mL

## 2013-04-01 MED ORDER — FUROSEMIDE 10 MG/ML IJ SOLN
20.0000 mg | Freq: Once | INTRAMUSCULAR | Status: AC
  Administered 2013-04-01: 20 mg via INTRAVENOUS
  Filled 2013-04-01: qty 2

## 2013-04-02 HISTORY — PX: LYMPH NODE BIOPSY: SHX201

## 2013-04-02 LAB — TYPE AND SCREEN
ABO/RH(D): O POS
Unit division: 0

## 2013-04-06 ENCOUNTER — Encounter (INDEPENDENT_AMBULATORY_CARE_PROVIDER_SITE_OTHER): Payer: Self-pay

## 2013-04-06 ENCOUNTER — Encounter: Payer: Self-pay | Admitting: Gastroenterology

## 2013-04-06 ENCOUNTER — Other Ambulatory Visit: Payer: Self-pay

## 2013-04-06 ENCOUNTER — Ambulatory Visit (INDEPENDENT_AMBULATORY_CARE_PROVIDER_SITE_OTHER): Payer: Medicare Other | Admitting: Gastroenterology

## 2013-04-06 VITALS — BP 128/66 | HR 80 | Temp 97.8°F | Wt 165.0 lb

## 2013-04-06 DIAGNOSIS — K922 Gastrointestinal hemorrhage, unspecified: Secondary | ICD-10-CM

## 2013-04-06 NOTE — Progress Notes (Signed)
  Subjective:    Patient ID: Jonathan Watson, male    DOB: 21-Jun-1940, 72 y.o.   MRN: [id number]  Jonathan Branch, MD ONCOLOGY-DR. [facility] SURGONC: SHEN  HPI STILL SEEING BLACK STOOL. EVERY DAY SEVERAL TIMES A DAY. LAST TRANSFUSION: 2u pRBCs. APPETITE: GOOD. NO CHEST PAIN OR SOB. ONCE IN A WHILE HAS BELLY PAIN. NO NL SOLID STOOL. DR. Flonnie Hailstone CALLED AND FOUND TUMORS IN HIS SMALL BOWEL. HE WANTED TO OPERATE. PT DENIES FEVER, CHILLS, BRBPR, nausea, vomiting, constipation, problems swallowing, OR heartburn or indigestion.  Past Medical History  Diagnosis Date  . MI (myocardial infarction)   . Hypertension   . Hyperlipidemia   . Coronary artery disease   . Dementia 01/24/2013  . GERD (gastroesophageal reflux disease)   . Blood transfusion without reported diagnosis    Past Surgical History  Procedure Laterality Date  . Coronary artery bypass graft    . Cervical spine surgery    . Esophagogastroduodenoscopy N/A 01/15/2013    Dr. 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 Colonoscopy N/A 01/17/2013    Dr. Darrick Penna: normal TI, normal Jonathan, small internal hemorrhoids  . Appendectomy      No Known Allergies  Current Outpatient Prescriptions  Medication Sig Dispense Refill  . acetaminophen (TYLENOL) 500 MG tablet Take 500 mg by mouth every 6 (six) hours as needed for pain.      . metoprolol tartrate (LOPRESSOR) 25 MG tablet Take 25 mg by mouth every evening.      Marland Kitchen omeprazole (PRILOSEC) 20 MG capsule Take 20 mg by mouth 2 (two) times daily.      Tery Sanfilippo Calcium (STOOL SOFTENER PO) Take 1 tablet by mouth 2 (two) times daily.      . mupirocin ointment (BACTROBAN) 2 % Apply 1 application topically 2 (two) times daily.  22 g  0       Review of Systems     Objective:   Physical Exam  Vitals reviewed. Constitutional: He is oriented to person, place, and time. He appears well-nourished. No distress.  HENT:  Head: Normocephalic and atraumatic.   Mouth/Throat: Oropharynx is clear and moist. No oropharyngeal exudate.  Eyes: Pupils are equal, round, and reactive to light. No scleral icterus.  Neck: Normal range of motion.  Cardiovascular: Normal rate, regular rhythm and normal heart sounds.   Pulmonary/Chest: Effort normal and breath sounds normal. No respiratory distress.  Abdominal: Soft. Bowel sounds are normal. He exhibits no distension. There is no tenderness.  Musculoskeletal: Normal range of motion. He exhibits no edema.  Lymphadenopathy:    He has no cervical adenopathy.  Neurological: He is alert and oriented to person, place, and time.  NO FOCAL DEFICITS   Psychiatric: He has a normal mood and affect.          Assessment & Plan:

## 2013-04-06 NOTE — Assessment & Plan Note (Addendum)
CONTINUES WITH BLACK STOOLS.  PET SCAN SOON PLAN FOR CBC/pRBCs NEXT WEEK. PLAN FOR SURGERY AT [facility] ON HOLD.  RECENT SCAN AT [facility] SHOWS MULTIPLE AREAS ON SMALL BOWEL WHICH WOULD RESULT IN MULTIPLE SB RESECTIONS. FEELS CHEMO FIRST, SEE GI AT [facility], AND THEN CONSIDER SURGERY. I PERSONALLY DISCUSSED MANAGEMENT WITH DR. SHEN. OPV IN 3 MOS

## 2013-04-06 NOTE — Patient Instructions (Addendum)
COMPLETE  PET SCAN.   COMPLETE CBC NEXT WEEK.  PLAN FOR FOLLOW UP WITH GI AND SURGERY AT [facility].   FOLLOW UP IN 3 MOS.

## 2013-04-06 NOTE — Addendum Note (Signed)
Addended by: [place] on: 04/06/2013 11:49 AM   Modules accepted: Level of Service

## 2013-04-08 ENCOUNTER — Encounter (HOSPITAL_COMMUNITY): Payer: Self-pay

## 2013-04-08 ENCOUNTER — Ambulatory Visit (HOSPITAL_COMMUNITY)
Admission: RE | Admit: 2013-04-08 | Discharge: 2013-04-08 | Disposition: A | Discharge status: Home / Self Care | Payer: Medicare Other | Source: Ambulatory Visit | Attending: Hematology and Oncology | Admitting: Hematology and Oncology

## 2013-04-08 DIAGNOSIS — I700 Atherosclerosis of aorta: Secondary | ICD-10-CM | POA: Insufficient documentation

## 2013-04-08 DIAGNOSIS — N200 Calculus of kidney: Secondary | ICD-10-CM | POA: Insufficient documentation

## 2013-04-08 DIAGNOSIS — R599 Enlarged lymph nodes, unspecified: Secondary | ICD-10-CM | POA: Insufficient documentation

## 2013-04-08 DIAGNOSIS — C7800 Secondary malignant neoplasm of unspecified lung: Secondary | ICD-10-CM | POA: Insufficient documentation

## 2013-04-08 DIAGNOSIS — K6389 Other specified diseases of intestine: Secondary | ICD-10-CM

## 2013-04-08 DIAGNOSIS — G319 Degenerative disease of nervous system, unspecified: Secondary | ICD-10-CM | POA: Insufficient documentation

## 2013-04-08 DIAGNOSIS — C801 Malignant (primary) neoplasm, unspecified: Secondary | ICD-10-CM | POA: Insufficient documentation

## 2013-04-08 DIAGNOSIS — C7970 Secondary malignant neoplasm of unspecified adrenal gland: Secondary | ICD-10-CM | POA: Insufficient documentation

## 2013-04-08 DIAGNOSIS — I6529 Occlusion and stenosis of unspecified carotid artery: Secondary | ICD-10-CM | POA: Insufficient documentation

## 2013-04-08 DIAGNOSIS — R918 Other nonspecific abnormal finding of lung field: Secondary | ICD-10-CM

## 2013-04-08 DIAGNOSIS — R222 Localized swelling, mass and lump, trunk: Secondary | ICD-10-CM | POA: Insufficient documentation

## 2013-04-08 LAB — GLUCOSE, CAPILLARY: Glucose-Capillary: 82 mg/dL (ref 70–99)

## 2013-04-08 MED ORDER — FLUDEOXYGLUCOSE F - 18 (FDG) INJECTION
17.8000 | Freq: Once | INTRAVENOUS | Status: AC | PRN
  Administered 2013-04-08: 17.8 via INTRAVENOUS

## 2013-04-11 NOTE — Progress Notes (Signed)
cc'd to pcp 

## 2013-04-12 ENCOUNTER — Encounter (HOSPITAL_COMMUNITY): Payer: Medicare Other | Attending: Gastroenterology

## 2013-04-12 ENCOUNTER — Encounter (HOSPITAL_BASED_OUTPATIENT_CLINIC_OR_DEPARTMENT_OTHER): Payer: Medicare Other

## 2013-04-12 VITALS — BP 107/56 | HR 67 | Temp 98.1°F | Resp 16

## 2013-04-12 VITALS — BP 114/63 | HR 84 | Temp 98.3°F | Resp 18 | Wt 167.8 lb

## 2013-04-12 DIAGNOSIS — D500 Iron deficiency anemia secondary to blood loss (chronic): Secondary | ICD-10-CM

## 2013-04-12 DIAGNOSIS — R599 Enlarged lymph nodes, unspecified: Secondary | ICD-10-CM

## 2013-04-12 DIAGNOSIS — R918 Other nonspecific abnormal finding of lung field: Secondary | ICD-10-CM

## 2013-04-12 DIAGNOSIS — R222 Localized swelling, mass and lump, trunk: Secondary | ICD-10-CM

## 2013-04-12 DIAGNOSIS — D649 Anemia, unspecified: Secondary | ICD-10-CM

## 2013-04-12 DIAGNOSIS — K922 Gastrointestinal hemorrhage, unspecified: Secondary | ICD-10-CM

## 2013-04-12 DIAGNOSIS — R591 Generalized enlarged lymph nodes: Secondary | ICD-10-CM

## 2013-04-12 DIAGNOSIS — C3490 Malignant neoplasm of unspecified part of unspecified bronchus or lung: Secondary | ICD-10-CM | POA: Insufficient documentation

## 2013-04-12 LAB — CBC WITH DIFFERENTIAL/PLATELET
Basophils Absolute: 0.1 10*3/uL (ref 0.0–0.1)
HCT: 21.1 % — ABNORMAL LOW (ref 39.0–52.0)
Hemoglobin: 6.8 g/dL — CL (ref 13.0–17.0)
Lymphocytes Relative: 11 % — ABNORMAL LOW (ref 12–46)
Lymphs Abs: 0.7 10*3/uL (ref 0.7–4.0)
MCV: 81.8 fL (ref 78.0–100.0)
Monocytes Absolute: 0.7 10*3/uL (ref 0.1–1.0)
Monocytes Relative: 11 % (ref 3–12)
Neutro Abs: 4.8 10*3/uL (ref 1.7–7.7)
Neutrophils Relative %: 76 % (ref 43–77)
RBC: 2.58 MIL/uL — ABNORMAL LOW (ref 4.22–5.81)
RDW: 16.8 % — ABNORMAL HIGH (ref 11.5–15.5)
WBC: 6.4 10*3/uL (ref 4.0–10.5)

## 2013-04-12 LAB — COMPREHENSIVE METABOLIC PANEL
ALT: 11 U/L (ref 0–53)
Albumin: 2.5 g/dL — ABNORMAL LOW (ref 3.5–5.2)
Alkaline Phosphatase: 60 U/L (ref 39–117)
Chloride: 100 mEq/L (ref 96–112)
GFR calc Af Amer: 73 mL/min — ABNORMAL LOW (ref >90)
Glucose, Bld: 85 mg/dL (ref 70–99)
Potassium: 4.2 mEq/L (ref 3.5–5.1)
Sodium: 133 mEq/L — ABNORMAL LOW (ref 135–145)
Total Bilirubin: 0.2 mg/dL — ABNORMAL LOW (ref 0.3–1.2)
Total Protein: 6.3 g/dL (ref 6.0–8.3)

## 2013-04-12 LAB — APTT: aPTT: 36 seconds (ref 24–37)

## 2013-04-12 LAB — PREPARE RBC (CROSSMATCH)

## 2013-04-12 MED ORDER — SODIUM CHLORIDE 0.9 % IV SOLN
250.0000 mL | Freq: Once | INTRAVENOUS | Status: AC
  Administered 2013-04-12: 250 mL via INTRAVENOUS

## 2013-04-12 MED ORDER — ACETAMINOPHEN 325 MG PO TABS
650.0000 mg | ORAL_TABLET | ORAL | Status: AC
  Administered 2013-04-12: 650 mg via ORAL

## 2013-04-12 MED ORDER — ACETAMINOPHEN 325 MG PO TABS
ORAL_TABLET | ORAL | Status: AC
  Filled 2013-04-12: qty 2

## 2013-04-12 MED ORDER — DIPHENHYDRAMINE HCL 25 MG PO CAPS
ORAL_CAPSULE | ORAL | Status: AC
  Filled 2013-04-12: qty 1

## 2013-04-12 MED ORDER — SODIUM CHLORIDE 0.9 % IJ SOLN
10.0000 mL | INTRAMUSCULAR | Status: AC | PRN
  Administered 2013-04-12: 10 mL

## 2013-04-12 MED ORDER — DIPHENHYDRAMINE HCL 25 MG PO CAPS
25.0000 mg | ORAL_CAPSULE | ORAL | Status: AC
  Administered 2013-04-12: 25 mg via ORAL

## 2013-04-12 NOTE — Progress Notes (Signed)
[facility]  OFFICE PROGRESS NOTE  Colon Branch, MD [address] [place] [address]  DIAGNOSIS: Lung mass - Plan: CBC with Differential, Protime-INR, Comprehensive metabolic panel, APTT, Uric acid, Lactate dehydrogenase, CBC with Differential, Protime-INR, Comprehensive metabolic panel, APTT, Uric acid, Lactate dehydrogenase, Ambulatory referral to Interventional Radiology, heparin lock flush 100 unit/mL, heparin lock flush 100 unit/mL, sodium chloride 0.9 % injection 3 mL, Prepare RBC, Type and screen, CBC with Differential, Prepare RBC, Type and screen, US Biopsy, CANCELED: CT Biopsy, DISCONTINUED: 0.9 %  sodium chloride infusion, DISCONTINUED: sodium chloride 0.9 % injection 10 mL, CANCELED: Transfuse RBC  Lymphadenopathy - Plan: CBC with Differential, Protime-INR, Comprehensive metabolic panel, APTT, Uric acid, Lactate dehydrogenase, CBC with Differential, Protime-INR, Comprehensive metabolic panel, APTT, Uric acid, Lactate dehydrogenase, Ambulatory referral to Interventional Radiology, heparin lock flush 100 unit/mL, heparin lock flush 100 unit/mL, sodium chloride 0.9 % injection 3 mL, Prepare RBC, Type and screen, CBC with Differential, Prepare RBC, Type and screen, US Biopsy, CANCELED: CBC with Differential, CANCELED: CT Biopsy, DISCONTINUED: 0.9 %  sodium chloride infusion, DISCONTINUED: sodium chloride 0.9 % injection 10 mL, CANCELED: Transfuse RBC  Anemia, unspecified - Plan: heparin lock flush 100 unit/mL, heparin lock flush 100 unit/mL, sodium chloride 0.9 % injection 3 mL, Prepare RBC, Type and screen, CBC with Differential, Prepare RBC, Type and screen, DISCONTINUED: 0.9 %  sodium chloride infusion, DISCONTINUED: sodium chloride 0.9 % injection 10 mL, CANCELED: Transfuse RBC  GI bleed - Plan: Type and screen  Chief Complaint: Followup on PET scan report and further management.  CURRENT THERAPY: None   INTERVAL HISTORY: Jonathan Watson 72  y.o. male returns for followup after PET scan. His PET scan report revealed bilateral hypermetabolic cervical adenopathy and also right upper lobe lung mass with adjacent contiguous adenopathy extending up to the right-sided mediastinum is measuring about 4.0 x 4.3 cm today versus 3.5 x 3.6 cm on 01/14/2013 scans. Interval development of hypermetabolic adenopathy within the small  bowel mesentery since 01/16/2013. Index node measures 2.5 cm and a S.U.V. max of 26.4. Progression of small bowel all wall thickening and development of dilatation. Index left-sided small bowel lesion measures a S.U.V. max of 21.7. His EGD and colonoscopic which were performed in August 2014 revealed AV malformations on upper endoscopy and internal hemorrhoids on colonoscopy. He is scheduled to see Surgery. He continued to have  Black tarry stool and recieving intermittent blood transfusions.   He denies any headaches, dizziness, double vision, fevers, chills, night sweats, nausea, vomiting, diarrhea, constipation, chest pain, heart palpitations, shortness of breath,  urinary pain, urinary burning, urinary frequency, hematuria.  MEDICAL HISTORY: Past Medical History  Diagnosis Date  . MI (myocardial infarction)   . Hypertension   . Hyperlipidemia   . Coronary artery disease   . Dementia 01/24/2013  . GERD (gastroesophageal reflux disease)   . Blood transfusion without reported diagnosis     INTERIM HISTORY: has Acute blood loss anemia; Upper GI bleed; CAD (coronary artery disease); GERD (gastroesophageal reflux disease); Hyperlipidemia; Dark stools; Dizziness; GI bleed; Dementia; Essential hypertension, benign; Other and unspecified hyperlipidemia; Small bowel mass; Helicobacter pylori gastritis (chronic gastritis); Angina pectoris; Lung mass; Bowel wall thickening; Anemia due to blood loss, chronic; and Arteriovenous malformation of gastrointestinal tract on his problem list.    ALLERGIES:  has No Known  Allergies.  MEDICATIONS:  Current Outpatient Prescriptions  Medication Sig Dispense Refill  . acetaminophen (TYLENOL) 500 MG tablet Take 500 mg  by mouth every 6 (six) hours as needed for pain.      Tery Sanfilippo Calcium (STOOL SOFTENER PO) Take 1 tablet by mouth 2 (two) times daily.      Marland Kitchen omeprazole (PRILOSEC) 20 MG capsule Take 20 mg by mouth 2 (two) times daily.      . metoprolol tartrate (LOPRESSOR) 25 MG tablet Take 25 mg by mouth every evening.       No current facility-administered medications for this visit.    SURGICAL HISTORY:  Past Surgical History  Procedure Laterality Date  . Coronary artery bypass graft    . Cervical spine surgery    . Esophagogastroduodenoscopy N/A 01/15/2013    Dr. 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 Colonoscopy N/A 01/17/2013    Dr. Darrick Penna: normal TI, normal colon, small internal hemorrhoids  . Appendectomy      FAMILY HISTORY: family history includes Hypertension in his father and mother. There is no history of Colon cancer.  SOCIAL HISTORY:  reports that he quit smoking about 14 years ago. His smoking use included Cigarettes. He has a 37.5 pack-year smoking history. His smokeless tobacco use includes Snuff. He reports that he does not drink alcohol or use illicit drugs.  REVIEW OF SYSTEMS:  As mentioned in interval history PHYSICAL EXAMINATION: ECOG PERFORMANCE STATUS: 2 - Symptomatic, <50% confined to bed  Blood pressure 114/63, pulse 84, temperature 98.3 F (36.8 C), temperature source Oral, resp. rate 18, weight 167 lb 12.8 oz (76.114 kg).  GENERAL:alert, no distress, well nourished and well developed SKIN: no rashes or significant lesions HEAD: Normocephalic EYES: PERRLA, EOMI, Conjunctiva are pink and non-injected, sclera clear EARS: External ears normal OROPHARYNX:no erythema, lips, buccal mucosa, and tongue normal and mucous membranes are moist  NECK: Bilateral cervical lymphadenopathy noted,  no JVD, no stridor, non-tender LYMPH:  no palpable lymphadenopathy, no hepatosplenomegaly BREAST:breasts appear normal, no suspicious masses, no skin or nipple changes or axillary nodes LUNGS: clear to auscultation , coarse sounds heard HEART: regular rate & rhythm ABDOMEN:abdomen soft, obese and normal bowel sounds BACK: Back symmetric, no curvature. EXTREMITIES:no edema, no clubbing and no cyanosis  NEURO: alert & oriented x 3 with fluent speech, no focal motor/sensory deficits, gait normal   LABORATORY DATA: Office Visit on 04/12/2013  Component Date Value Range Status  . WBC 04/12/2013 6.4  4.0 - 10.5 K/uL Final  . RBC 04/12/2013 2.58* 4.22 - 5.81 MIL/uL Final  . Hemoglobin 04/12/2013 6.8* 13.0 - 17.0 g/dL Final   Comment: RESULT REPEATED AND VERIFIED                          CRITICAL RESULT CALLED TO, READ BACK BY AND VERIFIED WITH:                          Delight Ovens RN ON [id number] AT 1035 BY RESSEGGER R  . HCT 04/12/2013 21.1* 39.0 - 52.0 % Final  . MCV 04/12/2013 81.8  78.0 - 100.0 fL Final  . MCH 04/12/2013 26.4  26.0 - 34.0 pg Final  . MCHC 04/12/2013 32.2  30.0 - 36.0 g/dL Final  . RDW 13/12/6576 16.8* 11.5 - 15.5 % Final  . Platelets 04/12/2013 314  150 - 400 K/uL Final  . Neutrophils Relative % 04/12/2013 76  43 - 77 % Final  . Neutro Abs 04/12/2013 4.8  1.7 - 7.7 K/uL Final  .  Lymphocytes Relative 04/12/2013 11* 12 - 46 % Final  . Lymphs Abs 04/12/2013 0.7  0.7 - 4.0 K/uL Final  . Monocytes Relative 04/12/2013 11  3 - 12 % Final  . Monocytes Absolute 04/12/2013 0.7  0.1 - 1.0 K/uL Final  . Eosinophils Relative 04/12/2013 2  0 - 5 % Final  . Eosinophils Absolute 04/12/2013 0.1  0.0 - 0.7 K/uL Final  . Basophils Relative 04/12/2013 1  0 - 1 % Final  . Basophils Absolute 04/12/2013 0.1  0.0 - 0.1 K/uL Final  . Prothrombin Time 04/12/2013 15.3* 11.6 - 15.2 seconds Final  . INR 04/12/2013 1.24  0.00 - 1.49 Final  . Sodium 04/12/2013 133* 135 - 145 mEq/L Final  . Potassium  04/12/2013 4.2  3.5 - 5.1 mEq/L Final  . Chloride 04/12/2013 100  96 - 112 mEq/L Final  . CO2 04/12/2013 26  19 - 32 mEq/L Final  . Glucose, Bld 04/12/2013 85  70 - 99 mg/dL Final  . BUN 46/96/2952 19  6 - 23 mg/dL Final  . Creatinine, Ser 04/12/2013 1.12  0.50 - 1.35 mg/dL Final  . Calcium 84/13/2440 9.0  8.4 - 10.5 mg/dL Final  . Total Protein 04/12/2013 6.3  6.0 - 8.3 g/dL Final  . Albumin 03/29/2535 2.5* 3.5 - 5.2 g/dL Final  . AST 64/40/3474 18  0 - 37 U/L Final  . ALT 04/12/2013 11  0 - 53 U/L Final  . Alkaline Phosphatase 04/12/2013 60  39 - 117 U/L Final  . Total Bilirubin 04/12/2013 0.2* 0.3 - 1.2 mg/dL Final  . GFR calc non Af Amer 04/12/2013 63* >90 mL/min Final  . GFR calc Af Amer 04/12/2013 73* >90 mL/min Final   Comment: (NOTE)                          The eGFR has been calculated using the CKD EPI equation.                          This calculation has not been validated in all clinical situations.                          eGFR's persistently <90 mL/min signify possible Chronic Kidney                          Disease.  Marland Kitchen aPTT 04/12/2013 36  24 - 37 seconds Final  . Uric Acid, Serum 04/12/2013 5.8  4.0 - 7.8 mg/dL Final  . LDH 25/95/6387 115  94 - 250 U/L Final  . ABO/RH(D) 04/12/2013 O POS   Final  . Antibody Screen 04/12/2013 NEG   Final  . Sample Expiration 04/12/2013 04/15/2013   Final  . Unit Number 04/12/2013 [id number]   Final  . Blood Component Type 04/12/2013 RED CELLS,LR   Final  . Unit division 04/12/2013 00   Final  . Status of Unit 04/12/2013 ISSUED   Final  . Transfusion Status 04/12/2013 OK TO TRANSFUSE   Final  . Crossmatch Result 04/12/2013 Compatible   Final  . Unit Number 04/12/2013 [id number]   Final  . Blood Component Type 04/12/2013 RED CELLS,LR   Final  . Unit division 04/12/2013 00   Final  . Status of Unit 04/12/2013 ISSUED   Final  . Transfusion Status 04/12/2013 OK TO TRANSFUSE   Final  .  Crossmatch Result 04/12/2013 Compatible    Final  . Order Confirmation 04/12/2013 ORDER PROCESSED BY BLOOD BANK   Final  Hospital Outpatient Visit on 04/08/2013  Component Date Value Range Status  . Glucose-Capillary 04/08/2013 82  70 - 99 mg/dL Final  Office Visit on 03/30/2013  Component Date Value Range Status  . WBC 03/30/2013 6.4  4.0 - 10.5 K/uL Final  . RBC 03/30/2013 2.96* 4.22 - 5.81 MIL/uL Final  . Hemoglobin 03/30/2013 7.5* 13.0 - 17.0 g/dL Final  . HCT 13/12/6576 24.7* 39.0 - 52.0 % Final  . MCV 03/30/2013 83.4  78.0 - 100.0 fL Final  . MCH 03/30/2013 25.3* 26.0 - 34.0 pg Final  . MCHC 03/30/2013 30.4  30.0 - 36.0 g/dL Final  . RDW 46/96/2952 18.9* 11.5 - 15.5 % Final  . Platelets 03/30/2013 412* 150 - 400 K/uL Final  . Neutrophils Relative % 03/30/2013 72  43 - 77 % Final  . Lymphocytes Relative 03/30/2013 15  12 - 46 % Final  . Monocytes Relative 03/30/2013 9  3 - 12 % Final  . Eosinophils Relative 03/30/2013 3  0 - 5 % Final  . Basophils Relative 03/30/2013 1  0 - 1 % Final  . Neutro Abs 03/30/2013 4.5  1.7 - 7.7 K/uL Final  . Lymphs Abs 03/30/2013 1.0  0.7 - 4.0 K/uL Final  . Monocytes Absolute 03/30/2013 0.6  0.1 - 1.0 K/uL Final  . Eosinophils Absolute 03/30/2013 0.2  0.0 - 0.7 K/uL Final  . Basophils Absolute 03/30/2013 0.1  0.0 - 0.1 K/uL Final  . RBC Morphology 03/30/2013 POLYCHROMASIA PRESENT   Final   [place]  . WBC Morphology 03/30/2013 INCREASED BANDS (>20% BANDS)   Final  . Smear Review 03/30/2013 PLATELET COUNT CONFIRMED BY SMEAR   Final   PLATELETS APPEAR INCREASED  . Sodium 03/30/2013 136  135 - 145 mEq/L Final  . Potassium 03/30/2013 4.3  3.5 - 5.1 mEq/L Final  . Chloride 03/30/2013 101  96 - 112 mEq/L Final  . CO2 03/30/2013 26  19 - 32 mEq/L Final  . Glucose, Bld 03/30/2013 102* 70 - 99 mg/dL Final  . BUN 84/13/2440 14  6 - 23 mg/dL Final  . Creatinine, Ser 03/30/2013 1.09  0.50 - 1.35 mg/dL Final  . Calcium 03/29/2535 9.3  8.4 - 10.5 mg/dL Final  . Total Protein 03/30/2013 7.0  6.0 -  8.3 g/dL Final  . Albumin 64/40/3474 2.9* 3.5 - 5.2 g/dL Final  . AST 25/95/6387 28  0 - 37 U/L Final  . ALT 03/30/2013 26  0 - 53 U/L Final  . Alkaline Phosphatase 03/30/2013 74  39 - 117 U/L Final  . Total Bilirubin 03/30/2013 0.2* 0.3 - 1.2 mg/dL Final  . GFR calc non Af Amer 03/30/2013 65* >90 mL/min Final  . GFR calc Af Amer 03/30/2013 76* >90 mL/min Final   Comment: (NOTE)                          The eGFR has been calculated using the CKD EPI equation.                          This calculation has not been validated in all clinical situations.                          eGFR's persistently <90 mL/min signify possible Chronic Kidney  Disease.  . Ferritin 03/30/2013 99  22 - 322 ng/mL Final   Performed at [facility]  . LDH 03/30/2013 121  94 - 250 U/L Final  . Retic Ct Pct 03/30/2013 3.2* 0.4 - 3.1 % Final  . RBC. 03/30/2013 2.96* 4.22 - 5.81 MIL/uL Final  . Retic Count, Manual 03/30/2013 94.7  19.0 - 186.0 K/uL Final  . Order Confirmation 03/30/2013 ORDER PROCESSED BY BLOOD BANK   Final  . ABO/RH(D) 03/30/2013 O POS   Final  . Antibody Screen 03/30/2013 NEG   Final  . Sample Expiration 03/30/2013 04/02/2013   Final  . Unit Number 03/30/2013 [id number]   Final  . Blood Component Type 03/30/2013 RED CELLS,LR   Final  . Unit division 03/30/2013 00   Final  . Status of Unit 03/30/2013 ISSUED,FINAL   Final  . Transfusion Status 03/30/2013 OK TO TRANSFUSE   Final  . Crossmatch Result 03/30/2013 Compatible   Final  . Unit Number 03/30/2013 [id number]   Final  . Blood Component Type 03/30/2013 RED CELLS,LR   Final  . Unit division 03/30/2013 00   Final  . Status of Unit 03/30/2013 ISSUED,FINAL   Final  . Transfusion Status 03/30/2013 OK TO TRANSFUSE   Final  . Crossmatch Result 03/30/2013 Compatible   Final  Hospital Outpatient Visit on 03/14/2013  Component Date Value Range Status  . Hemoglobin 03/14/2013 8.0* 13.0 - 17.0 g/dL Final  . HCT  30/86/5784 26.9* 39.0 - 52.0 % Final  . ABO/RH(D) 03/14/2013 O POS   Final  . Antibody Screen 03/14/2013 NEG   Final  . Sample Expiration 03/14/2013 03/17/2013   Final  . Unit Number 03/14/2013 [id number]   Final  . Blood Component Type 03/14/2013 RED CELLS,LR   Final  . Unit division 03/14/2013 00   Final  . Status of Unit 03/14/2013 ISSUED,FINAL   Final  . Transfusion Status 03/14/2013 OK TO TRANSFUSE   Final  . Crossmatch Result 03/14/2013 Compatible   Final  . Order Confirmation 03/14/2013 ORDER PROCESSED BY BLOOD BANK   Final     Urinalysis    Component Value Date/Time   COLORURINE YELLOW 01/14/2013 1938   APPEARANCEUR CLEAR 01/14/2013 1938   LABSPEC 1.015 01/14/2013 1938   PHURINE 5.5 01/14/2013 1938   GLUCOSEU NEGATIVE 01/14/2013 1938   HGBUR NEGATIVE 01/14/2013 1938   BILIRUBINUR NEGATIVE 01/14/2013 1938   KETONESUR NEGATIVE 01/14/2013 1938   PROTEINUR NEGATIVE 01/14/2013 1938   UROBILINOGEN 0.2 01/14/2013 1938   NITRITE NEGATIVE 01/14/2013 1938   LEUKOCYTESUR NEGATIVE 01/14/2013 1938    RADIOGRAPHIC STUDIES: Nm Pet Image Initial (pi) Whole Body  04/08/2013   CLINICAL DATA:  Initial treatment strategy for staging of lung mass.  EXAM: NUCLEAR MEDICINE PET SKULL BASE TO THIGH  FASTING BLOOD GLUCOSE:  Value: 82mg /dl  TECHNIQUE: 40.1 mCi U-27 FDG was injected intravenously. CT data was obtained and used for attenuation correction and anatomic localization only. (This was not acquired as a diagnostic CT examination.) Additional exam technical data entered on technologist worksheet.  COMPARISON:  Abdominal pelvic CT 01/16/2013. Chest CT 01/14/2013  FINDINGS: NECK  Bilateral hypermetabolic cervical adenopathy. Index conglomerate of nodes in the left level 2 station measure up to 1.5 cm and a S.U.V. max of 15.4 on image 31/ series 2.  CHEST  Right upper lobe lung mass with adjacent contiguous adenopathy which extends into the right-sided mediastinum. The superior most mass measures 4.0 x 4.3  cm today versus 3.5 x 3.6 cm on 01/14/2013 (  when remeasured).  The direct extension versus adenopathy has also enlarged in the interval. This measures on the order of 4.6 cm. The conglomerate measures a S.U.V. max of 22.5.  ABDOMEN/PELVIS  Bilateral hypermetabolic adrenal nodules. The larger is on the left and measures 1.6 cm.  Interval development of hypermetabolic adenopathy within the small bowel mesentery since 01/16/2013. Index node measures 2.5 cm and a S.U.V. max of 26.4 on image 180/series 2.  Progression above small bowel all wall thickening and development of dilatation. Index left-sided small bowel lesion measures a S.U.V. max of 21.7 on image 175/ series 2. There is also a partially cavitary lesion immediately adjacent and anterior to the urinary bladder on image 212.  SKELETON  No abnormal marrow activity.  CT IMAGES PERFORMED FOR ATTENUATION CORRECTION  Carotid atherosclerosis. Cervical spine fixation.  Cerebral atrophy.  Prior median sternotomy. Punctate left renal calculus. Aortic atherosclerosis.  IMPRESSION: 1. Progressive findings within the abdomen and pelvis which are highly suspicious for lymphoma. 2. Progression of right upper lobe pulmonary and nodal/mediastinal process. Favor lymphoma as well. A synchronous primary bronchogenic carcinoma could look similar. 3. Hypermetabolic cervical adenopathy which is presumably related to lymphoma. 4. Bilateral adrenal metastasis/ lymphomatous involvement. If tissue sampling has not already been performed, the cervical nodes would likely be amenable to percutaneous sampling. Alternatively, the thoracic process may require sampling in order to exclude synchronous primaries. 5. Incidental findings, including left nephrolithiasis.   Electronically Signed   By: Jeronimo Greaves M.D.   On: 04/08/2013 09:49    ASSESSMENT:  #1. Intermittent blood loss anemia secondary to GI pathology, AVMs, possible malignancy of the small intestine as evidence by small bowel  thickening and lesion on PET scan/CT scan.  #2. Lung mass/Cervical and abdominal lymphadenopathy/Intestinal lesion highly suggestive of malignancy: lymphoma/ lung primary /Gi primary #3. Obstructive pulmonary disease.  #4. Coronary artery disease with no evidence of dysrhythmia or heart failure at this time.  #5 per hypertension, controlled.  #6. History of H. pylori gastritis, treated in August 2014.  #7. Mild dementia, here today with his daughter and son.    PLAN:  #1. Type and crossmatch and transfuse 2 units of packed red blood cells today #2. Appointment with IR for sonogram guided biopsy of cervical lymph node. Will send specimen for flow and cytogenetics. If needed will arrange for  lung mass biopsy after review of lymph node biopsy path report. Case discussed with IR. #3. Continue current medications. 4. F/u with Gi and surgery as scheduled    All questions were answered. The patient knows to call the clinic with any problems, questions or concerns. We can certainly see the patient much sooner if necessary.   I spent counseling the patient face to face. The total time spent in the appointment was 45 minutes   Annamarie Dawley, MD 04/12/2013 8:48 PM

## 2013-04-12 NOTE — Patient Instructions (Signed)
.  [facility] Discharge Instructions  RECOMMENDATIONS MADE BY THE CONSULTANT AND ANY TEST RESULTS WILL BE SENT TO YOUR REFERRING PHYSICIAN.  EXAM FINDINGS BY THE PHYSICIAN TODAY AND SIGNS OR SYMPTOMS TO REPORT TO CLINIC OR PRIMARY PHYSICIAN: Exam and findings as discussed by Dr. Lajuana Ripple. We will set you up for interventional radiology biopsy of rt lung and cervical node biopsy You need 2 units of blood INSTRUCTIONS/FOLLOW-UP: Weekly labs  Thank you for choosing [facility] to provide your oncology and hematology care.  To afford each patient quality time with our providers, please arrive at least 15 minutes before your scheduled appointment time.  With your help, our goal is to use those 15 minutes to complete the necessary work-up to ensure our physicians have the information they need to help with your evaluation and healthcare recommendations.    Effective January 1st, 2014, we ask that you re-schedule your appointment with our physicians should you arrive 10 or more minutes late for your appointment.  We strive to give you quality time with our providers, and arriving late affects you and other patients whose appointments are after yours.    Again, thank you for choosing [facility].  Our hope is that these requests will decrease the amount of time that you wait before being seen by our physicians.       _____________________________________________________________  Should you have questions after your visit to [facility], please contact our office at [phone] between the hours of 8:30 a.m. and 5:00 p.m.  Voicemails left after 4:30 p.m. will not be returned until the following business day.  For prescription refill requests, have your pharmacy contact our office with your prescription refill request.

## 2013-04-13 LAB — TYPE AND SCREEN
ABO/RH(D): O POS
Antibody Screen: NEGATIVE
Unit division: 0

## 2013-04-14 ENCOUNTER — Other Ambulatory Visit: Payer: Self-pay | Admitting: Radiology

## 2013-04-15 ENCOUNTER — Encounter (HOSPITAL_COMMUNITY): Payer: Self-pay | Admitting: Pharmacy Technician

## 2013-04-19 ENCOUNTER — Ambulatory Visit (HOSPITAL_COMMUNITY)
Admission: RE | Admit: 2013-04-19 | Discharge: 2013-04-19 | Disposition: A | Discharge status: Home / Self Care | Payer: Medicare Other | Source: Ambulatory Visit | Attending: Oncology | Admitting: Oncology

## 2013-04-19 ENCOUNTER — Other Ambulatory Visit (HOSPITAL_COMMUNITY): Payer: Medicare Other

## 2013-04-19 ENCOUNTER — Encounter (HOSPITAL_COMMUNITY): Payer: Self-pay

## 2013-04-19 DIAGNOSIS — I252 Old myocardial infarction: Secondary | ICD-10-CM | POA: Insufficient documentation

## 2013-04-19 DIAGNOSIS — K219 Gastro-esophageal reflux disease without esophagitis: Secondary | ICD-10-CM | POA: Insufficient documentation

## 2013-04-19 DIAGNOSIS — I2510 Atherosclerotic heart disease of native coronary artery without angina pectoris: Secondary | ICD-10-CM | POA: Insufficient documentation

## 2013-04-19 DIAGNOSIS — R591 Generalized enlarged lymph nodes: Secondary | ICD-10-CM

## 2013-04-19 DIAGNOSIS — R222 Localized swelling, mass and lump, trunk: Secondary | ICD-10-CM | POA: Insufficient documentation

## 2013-04-19 DIAGNOSIS — C770 Secondary and unspecified malignant neoplasm of lymph nodes of head, face and neck: Secondary | ICD-10-CM | POA: Insufficient documentation

## 2013-04-19 DIAGNOSIS — R918 Other nonspecific abnormal finding of lung field: Secondary | ICD-10-CM

## 2013-04-19 DIAGNOSIS — I10 Essential (primary) hypertension: Secondary | ICD-10-CM | POA: Insufficient documentation

## 2013-04-19 DIAGNOSIS — D649 Anemia, unspecified: Secondary | ICD-10-CM | POA: Insufficient documentation

## 2013-04-19 DIAGNOSIS — F0390 Unspecified dementia without behavioral disturbance: Secondary | ICD-10-CM | POA: Insufficient documentation

## 2013-04-19 DIAGNOSIS — E785 Hyperlipidemia, unspecified: Secondary | ICD-10-CM | POA: Insufficient documentation

## 2013-04-19 DIAGNOSIS — K921 Melena: Secondary | ICD-10-CM | POA: Insufficient documentation

## 2013-04-19 DIAGNOSIS — E279 Disorder of adrenal gland, unspecified: Secondary | ICD-10-CM | POA: Insufficient documentation

## 2013-04-19 DIAGNOSIS — Z87891 Personal history of nicotine dependence: Secondary | ICD-10-CM | POA: Insufficient documentation

## 2013-04-19 DIAGNOSIS — C801 Malignant (primary) neoplasm, unspecified: Secondary | ICD-10-CM | POA: Insufficient documentation

## 2013-04-19 DIAGNOSIS — Z01812 Encounter for preprocedural laboratory examination: Secondary | ICD-10-CM | POA: Insufficient documentation

## 2013-04-19 LAB — CBC
Hemoglobin: 8.9 g/dL — ABNORMAL LOW (ref 13.0–17.0)
MCH: 26.9 pg (ref 26.0–34.0)
MCHC: 32.8 g/dL (ref 30.0–36.0)
Platelets: 443 10*3/uL — ABNORMAL HIGH (ref 150–400)
RBC: 3.31 MIL/uL — ABNORMAL LOW (ref 4.22–5.81)
RDW: 16.3 % — ABNORMAL HIGH (ref 11.5–15.5)
WBC: 8.4 10*3/uL (ref 4.0–10.5)

## 2013-04-19 LAB — APTT: aPTT: 34 seconds (ref 24–37)

## 2013-04-19 LAB — PROTIME-INR
INR: 1.06 (ref 0.00–1.49)
Prothrombin Time: 13.6 seconds (ref 11.6–15.2)

## 2013-04-19 MED ORDER — SODIUM CHLORIDE 0.9 % IV SOLN: Freq: Once | INTRAVENOUS | Status: DC

## 2013-04-19 NOTE — Procedures (Signed)
Successful RT CERVICAL ADENOPATHY 18G CORE BX NO COMP STABLE PATH PENDING FULL REPORT IN PACS

## 2013-04-19 NOTE — H&P (Signed)
Jonathan Watson is an 72 y.o. male.   Chief Complaint: pt has had blood in stools since 12/2012 Anemia; Requires transfusion periodically Known metastatic lesions- refused work up initially Recently admitted to [facility] - pale/weakness Workup reveals lung mass +PET: RUL mass; B cervical LAN; abd/pelvis LAN; B adrenal masses Scheduled now for cervical LN biopsy HPI: CAD/MI; HTN; HLD; mild dementia; anemia  Past Medical History  Diagnosis Date  . MI (myocardial infarction)   . Hypertension   . Hyperlipidemia   . Coronary artery disease   . Dementia 01/24/2013  . GERD (gastroesophageal reflux disease)   . Blood transfusion without reported diagnosis     Past Surgical History  Procedure Laterality Date  . Coronary artery bypass graft    . Cervical spine surgery    . Esophagogastroduodenoscopy N/A 01/15/2013    Dr. 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 Colonoscopy N/A 01/17/2013    Dr. Darrick Penna: normal TI, normal colon, small internal hemorrhoids  . Appendectomy      Family History  Problem Relation Age of Onset  . Colon cancer Neg Hx   . Hypertension Mother   . Hypertension Father    Social History:  reports that he quit smoking about 14 years ago. His smoking use included Cigarettes. He has a 37.5 pack-year smoking history. His smokeless tobacco use includes Snuff. He reports that he does not drink alcohol or use illicit drugs.  Allergies: No Known Allergies   (Not in a hospital admission)  Results for orders placed during the hospital encounter of 04/19/13 (from the past 48 hour(s))  CBC     Status: Abnormal   Collection Time    04/19/13  1:00 PM      Result Value Range   WBC 8.4  4.0 - 10.5 K/uL   RBC 3.31 (*) 4.22 - 5.81 MIL/uL   Hemoglobin 8.9 (*) 13.0 - 17.0 g/dL   HCT 16.1 (*) 09.6 - 04.5 %   MCV 81.9  78.0 - 100.0 fL   MCH 26.9  26.0 - 34.0 pg   MCHC 32.8  30.0 - 36.0 g/dL   RDW 40.9 (*) 81.1 - 91.4 %   Platelets 443 (*)  150 - 400 K/uL   No results found.  Review of Systems  Constitutional: Positive for weight loss.  Respiratory: Positive for shortness of breath.   Cardiovascular: Negative for chest pain.  Gastrointestinal: Positive for abdominal pain and blood in stool. Negative for nausea and vomiting.  Neurological: Positive for dizziness and weakness. Negative for headaches.    Blood pressure 124/67, pulse 82, temperature 98 F (36.7 C), temperature source Oral, resp. rate 20, height 5\' 7"  (1.702 m), weight 167 lb (75.751 kg), SpO2 100.00%. Physical Exam  Constitutional: He is oriented to person, place, and time.  Cardiovascular: Normal rate, regular rhythm and normal heart sounds.   No murmur heard. Respiratory: Effort normal and breath sounds normal. He has no wheezes.  GI: Soft. Bowel sounds are normal. There is no tenderness.  Musculoskeletal: Normal range of motion.  Neurological: He is alert and oriented to person, place, and time.  Skin: Skin is warm and dry.  Psychiatric: He has a normal mood and affect. His behavior is normal. Judgment and thought content normal.     Assessment/Plan Anemia; blood in stools Known metastatic disease- no known primary (refused work up) Recently admitted to hospital - weakness; pale Work up revealed changes in PET +PET: RUL mass; B Cervical  LAN; B adrenal mass; abd/pelvis LAN Scheduled now for biopsy of cervical LN Pt and wife aware of procedure benefits and risks and agreeable to proceed Consent signed and in chart  Juriel Cid A 04/19/2013, 1:19 PM

## 2013-04-19 NOTE — Progress Notes (Signed)
Pt discharged home after LN bx.  Pt did not receive any sedation.  Rt side of neck dressed with adhesive bandage which is clean, dry and intact.

## 2013-04-20 DIAGNOSIS — R591 Generalized enlarged lymph nodes: Secondary | ICD-10-CM | POA: Insufficient documentation

## 2013-04-22 ENCOUNTER — Other Ambulatory Visit (HOSPITAL_COMMUNITY): Payer: Self-pay | Admitting: Oncology

## 2013-04-22 ENCOUNTER — Encounter (HOSPITAL_BASED_OUTPATIENT_CLINIC_OR_DEPARTMENT_OTHER): Payer: Medicare Other

## 2013-04-22 ENCOUNTER — Encounter (HOSPITAL_COMMUNITY): Payer: Self-pay

## 2013-04-22 VITALS — BP 125/71 | HR 79 | Temp 97.1°F | Resp 18 | Wt 167.8 lb

## 2013-04-22 DIAGNOSIS — D500 Iron deficiency anemia secondary to blood loss (chronic): Secondary | ICD-10-CM

## 2013-04-22 DIAGNOSIS — C3490 Malignant neoplasm of unspecified part of unspecified bronchus or lung: Secondary | ICD-10-CM

## 2013-04-22 DIAGNOSIS — C778 Secondary and unspecified malignant neoplasm of lymph nodes of multiple regions: Secondary | ICD-10-CM

## 2013-04-22 DIAGNOSIS — R591 Generalized enlarged lymph nodes: Secondary | ICD-10-CM

## 2013-04-22 DIAGNOSIS — C3492 Malignant neoplasm of unspecified part of left bronchus or lung: Secondary | ICD-10-CM

## 2013-04-22 DIAGNOSIS — K922 Gastrointestinal hemorrhage, unspecified: Secondary | ICD-10-CM

## 2013-04-22 MED ORDER — ALLOPURINOL 300 MG PO TABS: 300.0000 mg | ORAL_TABLET | Freq: Every day | ORAL | Status: DC

## 2013-04-22 NOTE — Progress Notes (Signed)
[facility]  OFFICE PROGRESS NOTE  Colon Branch, MD [address] [place] [address]  DIAGNOSIS: Lymphadenopathy, generalized  Small cell lung cancer, left, extensive stage disease - Plan: MR Brain W Contrast  Chief Complaint  Patient presents with  . Follow-up    cervical lymph node biopsy  . Anemia    chronic blood loss GI    CURRENT THERAPY: None, in process of workup.  INTERVAL HISTORY: Jonathan Watson 72 y.o. male returns for followup of lung mass with chronic blood loss anemia secondary to GI tract bleeding. Patient underwent cervical lymph node fine needle aspiration on 04/19/2013 at which time small cell carcinoma was diagnosed. This finding in conjunction with a large lung mass and diffuse lymphadenopathy involving the abdomen and mediastinum are wall consistent with extensive stage small cell lung cancer. He continues to do well with good appetite no nausea or vomiting. He denies any headache, but still has black stools without bright red rectal bleeding. He has occasional Donald discomfort without nausea or vomiting. He denies any lower extremity swelling or redness. He denies focal weakness, peripheral paresthesias, joint pain, skin rash, or seizures    MEDICAL HISTORY: Past Medical History  Diagnosis Date  . MI (myocardial infarction)   . Hypertension   . Hyperlipidemia   . Coronary artery disease   . Dementia 01/24/2013  . GERD (gastroesophageal reflux disease)   . Blood transfusion without reported diagnosis     INTERIM HISTORY: has Acute blood loss anemia; Upper GI bleed; CAD (coronary artery disease); GERD (gastroesophageal reflux disease); Hyperlipidemia; Dark stools; Dizziness; GI bleed; Dementia; Essential hypertension, benign; Other and unspecified hyperlipidemia; Small bowel mass; Helicobacter pylori gastritis (chronic gastritis); Angina pectoris; Lung mass; Bowel wall thickening; Anemia due to blood loss,  chronic; Arteriovenous malformation of gastrointestinal tract; Lymphadenopathy, generalized; and Small cell lung cancer on his problem list.    ALLERGIES:  has No Known Allergies.  MEDICATIONS: has a current medication list which includes the following prescription(s): acetaminophen, omeprazole, and allopurinol.  SURGICAL HISTORY:  Past Surgical History  Procedure Laterality Date  . Coronary artery bypass graft    . Cervical spine surgery    . Esophagogastroduodenoscopy N/A 01/15/2013    Dr. 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 Colonoscopy N/A 01/17/2013    Dr. Darrick Penna: normal TI, normal colon, small internal hemorrhoids  . Appendectomy    . Lymph node biopsy Right 04/2013    cervical    FAMILY HISTORY: family history includes Hypertension in his father and mother. There is no history of Colon cancer.  SOCIAL HISTORY:  reports that he quit smoking about 14 years ago. His smoking use included Cigarettes. He has a 37.5 pack-year smoking history. His smokeless tobacco use includes Snuff. He reports that he does not drink alcohol or use illicit drugs.  REVIEW OF SYSTEMS:  Other than that discussed above is noncontributory.  PHYSICAL EXAMINATION: ECOG PERFORMANCE STATUS: 1 - Symptomatic but completely ambulatory  Blood pressure 125/71, pulse 79, temperature 97.1 F (36.2 C), temperature source Oral, resp. rate 18, weight 167 lb 12.8 oz (76.114 kg), SpO2 100.00%.  GENERAL:alert, no distress and comfortable SKIN: skin color, texture, turgor are normal, no rashes or significant lesions EYES: PERLA; Conjunctiva are pink and non-injected, sclera clear OROPHARYNX:no exudate, no erythema on lips, buccal mucosa, or tongue. NECK: supple, thyroid normal size, non-tender, without nodularity. Right cervical lymph node biopsy site  is well-healed. CHEST: Increased AP diameter with decreased breath sounds on the left. LYMPH:  no palpable lymphadenopathy in  the cervical, axillary or inguinal LUNGS: clear to auscultation and percussion with normal breathing effort HEART: regular rate & rhythm and no murmurs. ABDOMEN:abdomen soft, non-tender and normal bowel sounds MUSCULOSKELETAL:no cyanosis of digits and no clubbing. Range of motion normal.  NEURO: alert & oriented x 3 with fluent speech, no focal motor/sensory deficits   LABORATORY DATA: Hospital Outpatient Visit on 04/19/2013  Component Date Value Range Status  . aPTT 04/19/2013 34  24 - 37 seconds Final  . WBC 04/19/2013 8.4  4.0 - 10.5 K/uL Final  . RBC 04/19/2013 3.31* 4.22 - 5.81 MIL/uL Final  . Hemoglobin 04/19/2013 8.9* 13.0 - 17.0 g/dL Final  . HCT 82/95/6213 27.1* 39.0 - 52.0 % Final  . MCV 04/19/2013 81.9  78.0 - 100.0 fL Final  . MCH 04/19/2013 26.9  26.0 - 34.0 pg Final  . MCHC 04/19/2013 32.8  30.0 - 36.0 g/dL Final  . RDW 08/65/7846 16.3* 11.5 - 15.5 % Final  . Platelets 04/19/2013 443* 150 - 400 K/uL Final  . Prothrombin Time 04/19/2013 13.6  11.6 - 15.2 seconds Final  . INR 04/19/2013 1.06  0.00 - 1.49 Final  Office Visit on 04/12/2013  Component Date Value Range Status  . WBC 04/12/2013 6.4  4.0 - 10.5 K/uL Final  . RBC 04/12/2013 2.58* 4.22 - 5.81 MIL/uL Final  . Hemoglobin 04/12/2013 6.8* 13.0 - 17.0 g/dL Final   Comment: RESULT REPEATED AND VERIFIED                          CRITICAL RESULT CALLED TO, READ BACK BY AND VERIFIED WITH:                          OLEARY B RN ON [id number] AT 1035 BY RESSEGGER Watson  . HCT 04/12/2013 21.1* 39.0 - 52.0 % Final  . MCV 04/12/2013 81.8  78.0 - 100.0 fL Final  . MCH 04/12/2013 26.4  26.0 - 34.0 pg Final  . MCHC 04/12/2013 32.2  30.0 - 36.0 g/dL Final  . RDW 84/13/2440 16.8* 11.5 - 15.5 % Final  . Platelets 04/12/2013 314  150 - 400 K/uL Final  . Neutrophils Relative % 04/12/2013 76  43 - 77 % Final  . Neutro Abs 04/12/2013 4.8  1.7 - 7.7 K/uL Final  . Lymphocytes Relative 04/12/2013 11* 12 - 46 % Final  . Lymphs Abs 04/12/2013  0.7  0.7 - 4.0 K/uL Final  . Monocytes Relative 04/12/2013 11  3 - 12 % Final  . Monocytes Absolute 04/12/2013 0.7  0.1 - 1.0 K/uL Final  . Eosinophils Relative 04/12/2013 2  0 - 5 % Final  . Eosinophils Absolute 04/12/2013 0.1  0.0 - 0.7 K/uL Final  . Basophils Relative 04/12/2013 1  0 - 1 % Final  . Basophils Absolute 04/12/2013 0.1  0.0 - 0.1 K/uL Final  . Prothrombin Time 04/12/2013 15.3* 11.6 - 15.2 seconds Final  . INR 04/12/2013 1.24  0.00 - 1.49 Final  . Sodium 04/12/2013 133* 135 - 145 mEq/L Final  . Potassium 04/12/2013 4.2  3.5 - 5.1 mEq/L Final  . Chloride 04/12/2013 100  96 - 112 mEq/L Final  . CO2 04/12/2013 26  19 - 32 mEq/L Final  . Glucose, Bld 04/12/2013 85  70 - 99 mg/dL Final  . BUN  04/12/2013 19  6 - 23 mg/dL Final  . Creatinine, Ser 04/12/2013 1.12  0.50 - 1.35 mg/dL Final  . Calcium 66/44/0347 9.0  8.4 - 10.5 mg/dL Final  . Total Protein 04/12/2013 6.3  6.0 - 8.3 g/dL Final  . Albumin 42/59/5638 2.5* 3.5 - 5.2 g/dL Final  . AST 75/64/3329 18  0 - 37 U/L Final  . ALT 04/12/2013 11  0 - 53 U/L Final  . Alkaline Phosphatase 04/12/2013 60  39 - 117 U/L Final  . Total Bilirubin 04/12/2013 0.2* 0.3 - 1.2 mg/dL Final  . GFR calc non Af Amer 04/12/2013 63* >90 mL/min Final  . GFR calc Af Amer 04/12/2013 73* >90 mL/min Final   Comment: (NOTE)                          The eGFR has been calculated using the CKD EPI equation.                          This calculation has not been validated in all clinical situations.                          eGFR's persistently <90 mL/min signify possible Chronic Kidney                          Disease.  Marland Kitchen aPTT 04/12/2013 36  24 - 37 seconds Final  . Uric Acid, Serum 04/12/2013 5.8  4.0 - 7.8 mg/dL Final  . LDH 51/88/4166 115  94 - 250 U/L Final  . ABO/RH(D) 04/12/2013 O POS   Final  . Antibody Screen 04/12/2013 NEG   Final  . Sample Expiration 04/12/2013 04/15/2013   Final  . Unit Number 04/12/2013 [id number]   Final  . Blood  Component Type 04/12/2013 RED CELLS,LR   Final  . Unit division 04/12/2013 00   Final  . Status of Unit 04/12/2013 ISSUED,FINAL   Final  . Transfusion Status 04/12/2013 OK TO TRANSFUSE   Final  . Crossmatch Result 04/12/2013 Compatible   Final  . Unit Number 04/12/2013 [id number]   Final  . Blood Component Type 04/12/2013 RED CELLS,LR   Final  . Unit division 04/12/2013 00   Final  . Status of Unit 04/12/2013 ISSUED,FINAL   Final  . Transfusion Status 04/12/2013 OK TO TRANSFUSE   Final  . Crossmatch Result 04/12/2013 Compatible   Final  . Order Confirmation 04/12/2013 ORDER PROCESSED BY BLOOD BANK   Final  Hospital Outpatient Visit on 04/08/2013  Component Date Value Range Status  . Glucose-Capillary 04/08/2013 82  70 - 99 mg/dL Final  Office Visit on 03/30/2013  Component Date Value Range Status  . WBC 03/30/2013 6.4  4.0 - 10.5 K/uL Final  . RBC 03/30/2013 2.96* 4.22 - 5.81 MIL/uL Final  . Hemoglobin 03/30/2013 7.5* 13.0 - 17.0 g/dL Final  . HCT 62/37/6283 24.7* 39.0 - 52.0 % Final  . MCV 03/30/2013 83.4  78.0 - 100.0 fL Final  . MCH 03/30/2013 25.3* 26.0 - 34.0 pg Final  . MCHC 03/30/2013 30.4  30.0 - 36.0 g/dL Final  . RDW 15/17/6160 18.9* 11.5 - 15.5 % Final  . Platelets 03/30/2013 412* 150 - 400 K/uL Final  . Neutrophils Relative % 03/30/2013 72  43 - 77 % Final  . Lymphocytes Relative 03/30/2013 15  12 - 46 % Final  .  Monocytes Relative 03/30/2013 9  3 - 12 % Final  . Eosinophils Relative 03/30/2013 3  0 - 5 % Final  . Basophils Relative 03/30/2013 1  0 - 1 % Final  . Neutro Abs 03/30/2013 4.5  1.7 - 7.7 K/uL Final  . Lymphs Abs 03/30/2013 1.0  0.7 - 4.0 K/uL Final  . Monocytes Absolute 03/30/2013 0.6  0.1 - 1.0 K/uL Final  . Eosinophils Absolute 03/30/2013 0.2  0.0 - 0.7 K/uL Final  . Basophils Absolute 03/30/2013 0.1  0.0 - 0.1 K/uL Final  . RBC Morphology 03/30/2013 POLYCHROMASIA PRESENT   Final   [place]  . WBC Morphology 03/30/2013 INCREASED BANDS (>20% BANDS)    Final  . Smear Review 03/30/2013 PLATELET COUNT CONFIRMED BY SMEAR   Final   PLATELETS APPEAR INCREASED  . Sodium 03/30/2013 136  135 - 145 mEq/L Final  . Potassium 03/30/2013 4.3  3.5 - 5.1 mEq/L Final  . Chloride 03/30/2013 101  96 - 112 mEq/L Final  . CO2 03/30/2013 26  19 - 32 mEq/L Final  . Glucose, Bld 03/30/2013 102* 70 - 99 mg/dL Final  . BUN 16/03/9603 14  6 - 23 mg/dL Final  . Creatinine, Ser 03/30/2013 1.09  0.50 - 1.35 mg/dL Final  . Calcium 54/01/8118 9.3  8.4 - 10.5 mg/dL Final  . Total Protein 03/30/2013 7.0  6.0 - 8.3 g/dL Final  . Albumin 14/78/2956 2.9* 3.5 - 5.2 g/dL Final  . AST 21/30/8657 28  0 - 37 U/L Final  . ALT 03/30/2013 26  0 - 53 U/L Final  . Alkaline Phosphatase 03/30/2013 74  39 - 117 U/L Final  . Total Bilirubin 03/30/2013 0.2* 0.3 - 1.2 mg/dL Final  . GFR calc non Af Amer 03/30/2013 65* >90 mL/min Final  . GFR calc Af Amer 03/30/2013 76* >90 mL/min Final   Comment: (NOTE)                          The eGFR has been calculated using the CKD EPI equation.                          This calculation has not been validated in all clinical situations.                          eGFR's persistently <90 mL/min signify possible Chronic Kidney                          Disease.  . Ferritin 03/30/2013 99  22 - 322 ng/mL Final   Performed at [facility]  . LDH 03/30/2013 121  94 - 250 U/L Final  . Retic Ct Pct 03/30/2013 3.2* 0.4 - 3.1 % Final  . RBC. 03/30/2013 2.96* 4.22 - 5.81 MIL/uL Final  . Retic Count, Manual 03/30/2013 94.7  19.0 - 186.0 K/uL Final  . Order Confirmation 03/30/2013 ORDER PROCESSED BY BLOOD BANK   Final  . ABO/RH(D) 03/30/2013 O POS   Final  . Antibody Screen 03/30/2013 NEG   Final  . Sample Expiration 03/30/2013 04/02/2013   Final  . Unit Number 03/30/2013 [id number]   Final  . Blood Component Type 03/30/2013 RED CELLS,LR   Final  . Unit division 03/30/2013 00   Final  . Status of Unit 03/30/2013 ISSUED,FINAL   Final  .  Transfusion Status 03/30/2013 OK  TO TRANSFUSE   Final  . Crossmatch Result 03/30/2013 Compatible   Final  . Unit Number 03/30/2013 [id number]   Final  . Blood Component Type 03/30/2013 RED CELLS,LR   Final  . Unit division 03/30/2013 00   Final  . Status of Unit 03/30/2013 ISSUED,FINAL   Final  . Transfusion Status 03/30/2013 OK TO TRANSFUSE   Final  . Crossmatch Result 03/30/2013 Compatible   Final    PATHOLOGY: 7846) Patient: Jonathan Watson Collected: 04/19/2013 Client: [facility] Accession: [id number] Received: 04/19/2013 Sherren Mocha, MD DOB: November 07, 1940 Age: 36 Gender: M Reported: 04/21/2013 [address] Patient Ph: [phone] MRN #: [id number] [place], [place] [address] Visit #: [id number] Chart #: Phone: [phone] Fax: CC: Dellis Anes, PA-C REPORT OF SURGICAL PATHOLOGY FINAL DIAGNOSIS Diagnosis Lymph node for lymphoma, Above right cervical - POORLY DIFFERENTIATED CARCINOMA CONSISTENT WITH SMALL CELL CARCINOMA. - SEE COMMENT. Microscopic Comment The sections show small soft tissue fragments representing a neoplastic process mostly composed of clusters and sheets of malignant appearing epithelioid cells displaying high nuclear cytoplasmic ratio, fine chromatin, inconspicuous nucleoli, and nuclear molding. Many of the tumor clusters display crush artifacts. There is prominent necrosis in parts of the biopsy. Intact lymph nodal tissue is not identified. Small fragments representing skeletal muscle are seen. To further evaluate this process, immunohistochemical stains were performed and the tumor cells stain as follows: Cytokeratin AE1/AE3 positive Cytokeratin 7 positive TTF-1 positive CD56 positive Synaptophysin positive Chromogranin focally positive Cytokeratin 5/6 negative Cytokeratin 20 negative CDX-2 negative Napsin-A negative p63 patchy positive (weak) LCA negative The overall histologic and phenotypic features are  consistent with involvement by small cell carcinoma of lung primary. Clinical correlation is recommended. (BNS:gt, 04/21/13) Jessica Priest SMIR MD Pathologist, Electronic Signature (Case signed 04/21/2013) Specimen Gross and Clinical Information 1 of 2 FINAL for Jonathan Watson, Jonathan Watson ([id number]) Specimen(s) Obtained: Lymph node for lymphoma, Above right cervical Specimen Clinical Information Suspect lung cancer of lymphoma (tl) Gross Received fresh are multiple fragments and cores of soft white tissue which have an aggregate measurement of 1 x 0.8 x 0.1 cm. A portion of the specimen is placed in RPMI for flow cytometry. The remainder is entirely submitted in one cassette. (GP:caf 04/19/13) Stain(s) used in Diagnosis: The following stain(s) were used in diagnosing the case: Synaptophysin, P63, CK-7, CK AE1AE3, CD45 (LCA), CDX-2, CD 56, CK 20, Chromogranin A, Napsin-A, Thyroid Transcription Factor -1, CK 5/6. The control(s) stained appropriately. Disclaimer Some of these immunohistochemical stains may have been developed and the performance characteristics determined by [facility]. Some may not have been cleared or approved by the U.S. Food and Drug Administration. The FDA has determined that such clearance or approval is not necessary. This test is used for clinical purposes. It should not be regarded as investigational or for research. This laboratory is certified under the Clinical Laboratory Improvement Amendments of 1988 (CLIA-88) as qualified to perform high complexity clinical laboratory testing. Report signed out from the following location(s) Technical Component performed at [facility] [address] [place], [place], [place] [address]. CLIA #: [id number], Technical Component performed at [facility] [address], [place], [place] [address]. CLIA #: [id number], Interpretation performed at [facility] [address], [place], [place] [address]. CLIA #: [id number], Urinalysis    Component Value Date/Time   COLORURINE YELLOW 01/14/2013 1938   APPEARANCEUR CLEAR 01/14/2013 1938   LABSPEC 1.015 01/14/2013 1938   PHURINE 5.5 01/14/2013 1938   GLUCOSEU NEGATIVE 01/14/2013 1938  HGBUR NEGATIVE 01/14/2013 1938   BILIRUBINUR NEGATIVE 01/14/2013 1938   KETONESUR NEGATIVE 01/14/2013 1938   PROTEINUR NEGATIVE 01/14/2013 1938   UROBILINOGEN 0.2 01/14/2013 1938   NITRITE NEGATIVE 01/14/2013 1938   LEUKOCYTESUR NEGATIVE 01/14/2013 1938    RADIOGRAPHIC STUDIES: Nm Pet Image Initial (pi) Whole Body  04/08/2013   CLINICAL DATA:  Initial treatment strategy for staging of lung mass.  EXAM: NUCLEAR MEDICINE PET SKULL BASE TO THIGH  FASTING BLOOD GLUCOSE:  Value: 82mg /dl  TECHNIQUE: 04.5 mCi W-09 FDG was injected intravenously. CT data was obtained and used for attenuation correction and anatomic localization only. (This was not acquired as a diagnostic CT examination.) Additional exam technical data entered on technologist worksheet.  COMPARISON:  Abdominal pelvic CT 01/16/2013. Chest CT 01/14/2013  FINDINGS: NECK  Bilateral hypermetabolic cervical adenopathy. Index conglomerate of nodes in the left level 2 station measure up to 1.5 cm and a S.U.V. max of 15.4 on image 31/ series 2.  CHEST  Right upper lobe lung mass with adjacent contiguous adenopathy which extends into the right-sided mediastinum. The superior most mass measures 4.0 x 4.3 cm today versus 3.5 x 3.6 cm on 01/14/2013 (when remeasured).  The direct extension versus adenopathy has also enlarged in the interval. This measures on the order of 4.6 cm. The conglomerate measures a S.U.V. max of 22.5.  ABDOMEN/PELVIS  Bilateral hypermetabolic adrenal nodules. The larger is on the left and measures 1.6 cm.  Interval development of hypermetabolic adenopathy within the small bowel mesentery since 01/16/2013. Index node measures 2.5 cm and a S.U.V. max of 26.4 on image 180/series 2.   Progression above small bowel all wall thickening and development of dilatation. Index left-sided small bowel lesion measures a S.U.V. max of 21.7 on image 175/ series 2. There is also a partially cavitary lesion immediately adjacent and anterior to the urinary bladder on image 212.  SKELETON  No abnormal marrow activity.  CT IMAGES PERFORMED FOR ATTENUATION CORRECTION  Carotid atherosclerosis. Cervical spine fixation.  Cerebral atrophy.  Prior median sternotomy. Punctate left renal calculus. Aortic atherosclerosis.  IMPRESSION: 1. Progressive findings within the abdomen and pelvis which are highly suspicious for lymphoma. 2. Progression of right upper lobe pulmonary and nodal/mediastinal process. Favor lymphoma as well. A synchronous primary bronchogenic carcinoma could look similar. 3. Hypermetabolic cervical adenopathy which is presumably related to lymphoma. 4. Bilateral adrenal metastasis/ lymphomatous involvement. If tissue sampling has not already been performed, the cervical nodes would likely be amenable to percutaneous sampling. Alternatively, the thoracic process may require sampling in order to exclude synchronous primaries. 5. Incidental findings, including left nephrolithiasis.   Electronically Signed   By: Jeronimo Greaves M.D.   On: 04/08/2013 09:49   US Biopsy  04/19/2013   CLINICAL DATA:  Cervical PET positive adenopathy, suspect lymphoma versus metastatic lung cancer  EXAM: ULTRASOUND GUIDED core BIOPSY OF right cervical adenopathy  MEDICATIONS: None.  PROCEDURE: The procedure, risks, benefits, and alternatives were explained to the patient. Questions regarding the procedure were encouraged and answered. The patient understands and consents to the procedure.  The right neck was prepped with Betadine in a sterile fashion, and a sterile drape was applied covering the operative field. A sterile gown and sterile gloves were used for the procedure. Local anesthesia was provided with 1% Lidocaine.   Previous imaging reviewed. Preliminary ultrasound performed of the right neck. Abnormal adenopathy localized. Under sterile conditions and local anesthesia, an 18 gauge core biopsy needle was advanced into the right neck adenopathy.  Six core biopsies were obtained with an 18 gauge core device. Samples placed in saline. Postprocedure imaging demonstrates no hemorrhage or hematoma. Patient tolerated the biopsy well. Postprocedure imaging demonstrates no hemorrhage or hematoma.  COMPLICATIONS: None.  FINDINGS: Imaging confirms needle placed in the right cervical adenopathy  IMPRESSION: Successful ultrasound right cervical adenopathy 18 gauge core biopsies   Electronically Signed   By: Ruel Favors M.D.   On: 04/19/2013 14:35    ASSESSMENT:  #1. Extensive stage small cell lung cancer with intra-abdominal lymph node and mediastinal lymph node involvement. #2. GI blood loss, still questionable etiology with history of adequately treated H. pylori gastritis. #3. Chronic obstructive pulmonary disease. #4. Chronic blood loss anemia, status post multiple transfusions. #5. Mild dementia.   PLAN:  #1. MRI of the brain to complete staging. #2. Life port insertion for administration of chemotherapy. #3. Information was given to the patient and his family on VP-16 and carboplatin which will be given every 3 weeks as his chemotherapy regimen. Plan is to start treatment on 05/09/2013. He'll also receive Neulasta on day 4 of each cycle of treatment to lessen the duration of neutropenia. #4. Appointment with nurse navigator for further details on potential benefits and risks of chemotherapy. #5. Begin allopurinol 300 mg daily. #6. Plan to begin treatment on 05/09/2013    All questions were answered. The patient knows to call the clinic with any problems, questions or concerns. We can certainly see the patient much sooner if necessary.   I spent 40 minutes counseling the patient face to face. The total time spent  in the appointment was 55 minutes.    Maurilio Lovely, MD 04/22/2013 3:06 PM

## 2013-04-22 NOTE — Patient Instructions (Addendum)
[facility] Discharge Instructions  RECOMMENDATIONS MADE BY THE CONSULTANT AND ANY TEST RESULTS WILL BE SENT TO YOUR REFERRING PHYSICIAN.  EXAM FINDINGS BY THE PHYSICIAN TODAY AND SIGNS OR SYMPTOMS TO REPORT TO CLINIC OR PRIMARY PHYSICIAN: Exam and findings as discussed by Dr. Zigmund Daniel.  You have small cell lung cancer and we need to do some additional testing and get a port a cath placed before we start therapy.  Rosana Berger will meet with you to go over all treatment plans.  MEDICATIONS PRESCRIBED:  Allopurinol 300 mg daily  INSTRUCTIONS/FOLLOW-UP: To be arranged.  Thank you for choosing [facility] to provide your oncology and hematology care.  To afford each patient quality time with our providers, please arrive at least 15 minutes before your scheduled appointment time.  With your help, our goal is to use those 15 minutes to complete the necessary work-up to ensure our physicians have the information they need to help with your evaluation and healthcare recommendations.    Effective January 1st, 2014, we ask that you re-schedule your appointment with our physicians should you arrive 10 or more minutes late for your appointment.  We strive to give you quality time with our providers, and arriving late affects you and other patients whose appointments are after yours.    Again, thank you for choosing [facility].  Our hope is that these requests will decrease the amount of time that you wait before being seen by our physicians.       _____________________________________________________________  Should you have questions after your visit to [facility], please contact our office at [phone] between the hours of 8:30 a.m. and 5:00 p.m.  Voicemails left after 4:30 p.m. will not be returned until the following business day.  For prescription refill requests, have your pharmacy contact our office with your prescription refill  request.

## 2013-04-25 ENCOUNTER — Encounter (HOSPITAL_COMMUNITY): Payer: Self-pay | Admitting: Pharmacy Technician

## 2013-04-25 ENCOUNTER — Other Ambulatory Visit: Payer: Self-pay | Admitting: Radiology

## 2013-04-25 MED ORDER — PROCHLORPERAZINE MALEATE 10 MG PO TABS: ORAL_TABLET | ORAL | Status: AC

## 2013-04-25 MED ORDER — LIDOCAINE-PRILOCAINE 2.5-2.5 % EX CREA: TOPICAL_CREAM | CUTANEOUS | Status: AC

## 2013-04-25 MED ORDER — DEXAMETHASONE 4 MG PO TABS: ORAL_TABLET | ORAL | Status: AC

## 2013-04-25 MED ORDER — METOCLOPRAMIDE HCL 5 MG PO TABS: ORAL_TABLET | ORAL | Status: AC

## 2013-04-25 NOTE — Patient Instructions (Addendum)
[facility]   CHEMOTHERAPY INSTRUCTIONS  Carboplatin - this medication can be hard on your kidneys - this is why we need you to drink 64 oz of fluid (preferably water/decaff fluids) 2 days prior to chemo and for up to 4-5 days after chemo. Drink more if you can. This will help to keep your kidneys flushed. This can cause mild hair loss, lower your platelets (which make your blood clot), lower your white blood cells (fight infection), and cause nausea/vomiting.   VP16 (Etoposide) - this medication can lower your blood pressure. We will monitor this during chemo. Bone marrow suppression (lowers white blood cells (fight infection), lowers red blood cells (make up your blood), lowers platelets (help blood to clot). Hair loss, loss of appetite.  Neulasta - this medication is not chemo but being given because you have had chemo. It is usually given 20-24 hours after the completion of chemotherapy. This medication works by boosting your bone marrow's supply of white blood cells. White blood cells are what protect our bodies against infection. The medication is given in the form of a subcutaneous injection. It is given in the fatty tissue of your abdomen. It is a short needle. The major side effect of this medication is bone or muscle pain. The drug of choice to relieve or lessen the pain is Aleve or Ibuprofen. If a physician has ever told you not to take Aleve or Ibuprofen - then don't take it. You should then take Tylenol/acetaminophen. Take either medication as the bottle directs you to.  The level of pain you experience as a result of this injection can range from none, to mild or moderate, or severe. Please let us know if you develop moderate or severe bone pain.    Your chemo will consist of 3 days in a row. You will receive your Neulasta injection on day 4. These 4 days in a row will repeat every 21 days (from the first day of chemo).   POTENTIAL SIDE EFFECTS OF  TREATMENT: Increased Susceptibility to Infection, Vomiting, Constipation, Hair Thinning, Changes in Character of Skin and Nails (brittleness, dryness,etc.), Bone Marrow Suppression, Complete Hair Loss, Nausea, Diarrhea and Mouth Sores   SELF IMAGE NEEDS AND REFERRALS MADE: Obtain hair accessories as soon as possible (caps,etc.)   EDUCATIONAL MATERIALS GIVEN AND REVIEWED: Specific Instructions Sheets: Carboplatin/VP16, Dexamethasone, Zofran, Metoclopramide, Prochlorperazine, EMLA cream, port-a-cath, Neulasta   SELF CARE ACTIVITIES WHILE ON CHEMOTHERAPY: Increase your fluid intake 48 hours prior to treatment and drink at least 2 quarts per day after treatment., No alcohol intake., No aspirin or other medications unless approved by your oncologist., Eat foods that are light and easy to digest., Eat foods at cold or room temperature., No fried, fatty, or spicy foods immediately before or after treatment., Have teeth cleaned professionally before starting treatment. Keep dentures and partial plates clean., Use soft toothbrush and do not use mouthwashes that contain alcohol. Biotene is a good mouthwash. Use warm salt water gargles (1 teaspoon salt per 1 quart warm water) before and after meals and at bedtime. Or you may rinse with 2 tablespoons of three -percent hydrogen peroxide mixed in eight ounces of water., Always use sunscreen with SPF (Sun Protection Factor) of 30 or higher., Use your nausea medication as directed to prevent nausea., Use your stool softener or laxative as directed to prevent constipation. and Use your anti-diarrheal medication as directed to stop diarrhea.  Please wash your hands for at least 30 seconds using  warm soapy water. Handwashing is the #1 way to prevent the spread of germs. Stay away from sick people or people who are getting over a cold. If you develop respiratory systems such as green/yellow mucus production or productive cough or persistent cough let us know and we will  see if you need an antibiotic. It is a good idea to keep a pair of gloves on when going into grocery stores/[employer] to decrease your risk of coming into contact with germs on the carts, etc. Carry alcohol hand gel with you at all times and use it frequently if out in public. All foods need to be cooked thoroughly. No raw foods. No medium or undercooked meats, eggs. If your food is cooked medium well, it does not need to be hot pink or saturated with bloody liquid at all. Vegetables and fruits need to be washed/rinsed under the faucet with a dish detergent before being consumed. You can eat raw fruits and vegetables unless we tell you otherwise but it would be best if you cooked them or bought frozen. Do not eat off of salad bars or hot bars unless you really trust the cleanliness of the restaurant. If you need dental work, please let us know before you go for your appointment so that we can coordinate the best possible time for you in regards to your chemo regimen. You need to also let your dentist know that you are actively taking chemo. We may need to do labs prior to your dental appointment. We also want your bowels moving at least every other day. If this is not happening, we need to know so that we can get you on a bowel regimen to help you go.    MEDICATIONS: You have been given prescriptions for the following medications:  Dexamethasone 4mg  tablet. Starting the day after chemo, take 2 tabs in the am and 2 tabs in the pm for 3 days. Then stop. Take with food.   Metoclopramide 5mg  tablet. Starting the day after chemo, take 1 tablet four times a day x 48 hours. Then take 1 tablet four times a day IF needed for nausea/vomiting.   Prochlorperazine 10mg  tablet. Starting the day after chemo, take 1 tablet four times a day x 48 hours. Then take 1 tablet four times a day IF needed for nausea/vomiting.   EMLA cream. Apply a quarter size amount to port site 1 hour prior to chemo. Do not rub in. Cover with  plastic wrap.     Over-the-Counter Meds:  Colace - this is a stool softener. Take 100mg  capsule 2-6 times a day as needed. If you have to take more than 6 capsules of Colace a day call the Cancer Center.  Senna - this is a mild laxative used to treat mild constipation. May take up to 3 tabs at bedtime as needed for mild constipation.   Milk of Magnesia - this is a laxative used to treat moderate to severe constipation. May take 2-4 tablespoons every 8 hours as needed. May increase to 8 tablespoons x 1 dose and if no bowel movement call the Cancer Center.   Imodium - this is for diarrhea. Take 2 tabs after 1st loose stool and then 1 tab after each loose stool until you go a total of 12 hours without a loose stool. Call Cancer Center if loose stools continue. If you develop diarrhea during the weekday, call the Cancer Center and we can call you in a prescription strength anti-diarrheal.  SYMPTOMS TO REPORT AS SOON AS POSSIBLE AFTER TREATMENT:  FEVER GREATER THAN 100.5 F  CHILLS WITH OR WITHOUT FEVER  NAUSEA AND VOMITING THAT IS NOT CONTROLLED WITH YOUR NAUSEA MEDICATION  UNUSUAL SHORTNESS OF BREATH  UNUSUAL BRUISING OR BLEEDING  TENDERNESS IN MOUTH AND THROAT WITH OR WITHOUT PRESENCE OF ULCERS  URINARY PROBLEMS  BOWEL PROBLEMS  UNUSUAL RASH    Wear comfortable clothing and clothing appropriate for easy access to any Portacath or PICC line. Let us know if there is anything that we can do to make your therapy better!      I have been informed and understand all of the instructions given to me and have received a copy. I have been instructed to call the clinic [phone] or my family physician as soon as possible for continued medical care, if indicated. I do not have any more questions at this time but understand that I may call the Cancer Center or the Patient Navigator at [phone] during office hours should I have questions or need assistance in obtaining follow-up  care.      _________________________________________      _______________     __________ Signature of Patient or Authorized Representative        Date                            Time      _________________________________________ Nurse's Signature      Carboplatin injection What is this medicine? CARBOPLATIN (KAR boe pla tin) is a chemotherapy drug. It targets fast dividing cells, like cancer cells, and causes these cells to die. This medicine is used to treat ovarian cancer and many other cancers. This medicine may be used for other purposes; ask your health care provider or pharmacist if you have questions. COMMON BRAND NAME(S): Paraplatin What should I tell my health care provider before I take this medicine? They need to know if you have any of these conditions: -blood disorders -hearing problems -kidney disease -recent or ongoing radiation therapy -an unusual or allergic reaction to carboplatin, cisplatin, other chemotherapy, other medicines, foods, dyes, or preservatives -pregnant or trying to get pregnant -breast-feeding How should I use this medicine? This drug is usually given as an infusion into a vein. It is administered in a hospital or clinic by a specially trained health care professional. Talk to your pediatrician regarding the use of this medicine in children. Special care may be needed. Overdosage: If you think you have taken too much of this medicine contact a poison control center or emergency room at once. NOTE: This medicine is only for you. Do not share this medicine with others. What if I miss a dose? It is important not to miss a dose. Call your doctor or health care professional if you are unable to keep an appointment. What may interact with this medicine? -medicines for seizures -medicines to increase blood counts like filgrastim, pegfilgrastim, sargramostim -some antibiotics like amikacin, gentamicin, neomycin, streptomycin,  tobramycin -vaccines Talk to your doctor or health care professional before taking any of these medicines: -acetaminophen -aspirin -ibuprofen -ketoprofen -naproxen This list may not describe all possible interactions. Give your health care provider a list of all the medicines, herbs, non-prescription drugs, or dietary supplements you use. Also tell them if you smoke, drink alcohol, or use illegal drugs. Some items may interact with your medicine. What should I watch for while using this medicine? Your condition will be  monitored carefully while you are receiving this medicine. You will need important blood work done while you are taking this medicine. This drug may make you feel generally unwell. This is not uncommon, as chemotherapy can affect healthy cells as well as cancer cells. Report any side effects. Continue your course of treatment even though you feel ill unless your doctor tells you to stop. In some cases, you may be given additional medicines to help with side effects. Follow all directions for their use. Call your doctor or health care professional for advice if you get a fever, chills or sore throat, or other symptoms of a cold or flu. Do not treat yourself. This drug decreases your body's ability to fight infections. Try to avoid being around people who are sick. This medicine may increase your risk to bruise or bleed. Call your doctor or health care professional if you notice any unusual bleeding. Be careful brushing and flossing your teeth or using a toothpick because you may get an infection or bleed more easily. If you have any dental work done, tell your dentist you are receiving this medicine. Avoid taking products that contain aspirin, acetaminophen, ibuprofen, naproxen, or ketoprofen unless instructed by your doctor. These medicines may hide a fever. Do not become pregnant while taking this medicine. Women should inform their doctor if they wish to become pregnant or think  they might be pregnant. There is a potential for serious side effects to an unborn child. Talk to your health care professional or pharmacist for more information. Do not breast-feed an infant while taking this medicine. What side effects may I notice from receiving this medicine? Side effects that you should report to your doctor or health care professional as soon as possible: -allergic reactions like skin rash, itching or hives, swelling of the face, lips, or tongue -signs of infection - fever or chills, cough, sore throat, pain or difficulty passing urine -signs of decreased platelets or bleeding - bruising, pinpoint red spots on the skin, black, tarry stools, nosebleeds -signs of decreased red blood cells - unusually weak or tired, fainting spells, lightheadedness -breathing problems -changes in hearing -changes in vision -chest pain -high blood pressure -low blood counts - This drug may decrease the number of white blood cells, red blood cells and platelets. You may be at increased risk for infections and bleeding. -nausea and vomiting -pain, swelling, redness or irritation at the injection site -pain, tingling, numbness in the hands or feet -problems with balance, talking, walking -trouble passing urine or change in the amount of urine Side effects that usually do not require medical attention (report to your doctor or health care professional if they continue or are bothersome): -hair loss -loss of appetite -metallic taste in the mouth or changes in taste This list may not describe all possible side effects. Call your doctor for medical advice about side effects. You may report side effects to FDA at 1-800-FDA-1088. Where should I keep my medicine? This drug is given in a hospital or clinic and will not be stored at home. NOTE: This sheet is a summary. It may not cover all possible information. If you have questions about this medicine, talk to your doctor, pharmacist, or health care  provider.  2014, Elsevier/Gold Standard. (2007-08-24 14:38:05) Etoposide, VP-16 injection What is this medicine? ETOPOSIDE, VP-16 (e toe POE side) is a chemotherapy drug. It is used to treat testicular cancer, lung cancer, and other cancers. This medicine may be used for other purposes; ask your health  care provider or pharmacist if you have questions. COMMON BRAND NAME(S): Etopophos, Toposar, VePesid What should I tell my health care provider before I take this medicine? They need to know if you have any of these conditions: -infection -kidney disease -low blood counts, like low white cell, platelet, or red cell counts -an unusual or allergic reaction to etoposide, other chemotherapeutic agents, other medicines, foods, dyes, or preservatives -pregnant or trying to get pregnant -breast-feeding How should I use this medicine? This medicine is for infusion into a vein. It is administered in a hospital or clinic by a specially trained health care professional. Talk to your pediatrician regarding the use of this medicine in children. Special care may be needed. Overdosage: If you think you have taken too much of this medicine contact a poison control center or emergency room at once. NOTE: This medicine is only for you. Do not share this medicine with others. What if I miss a dose? It is important not to miss your dose. Call your doctor or health care professional if you are unable to keep an appointment. What may interact with this medicine? -cyclosporine -medicines to increase blood counts like filgrastim, pegfilgrastim, sargramostim -vaccines This list may not describe all possible interactions. Give your health care provider a list of all the medicines, herbs, non-prescription drugs, or dietary supplements you use. Also tell them if you smoke, drink alcohol, or use illegal drugs. Some items may interact with your medicine. What should I watch for while using this medicine? Visit your  doctor for checks on your progress. This drug may make you feel generally unwell. This is not uncommon, as chemotherapy can affect healthy cells as well as cancer cells. Report any side effects. Continue your course of treatment even though you feel ill unless your doctor tells you to stop. In some cases, you may be given additional medicines to help with side effects. Follow all directions for their use. Call your doctor or health care professional for advice if you get a fever, chills or sore throat, or other symptoms of a cold or flu. Do not treat yourself. This drug decreases your body's ability to fight infections. Try to avoid being around people who are sick. This medicine may increase your risk to bruise or bleed. Call your doctor or health care professional if you notice any unusual bleeding. Be careful brushing and flossing your teeth or using a toothpick because you may get an infection or bleed more easily. If you have any dental work done, tell your dentist you are receiving this medicine. Avoid taking products that contain aspirin, acetaminophen, ibuprofen, naproxen, or ketoprofen unless instructed by your doctor. These medicines may hide a fever. Do not become pregnant while taking this medicine. Women should inform their doctor if they wish to become pregnant or think they might be pregnant. There is a potential for serious side effects to an unborn child. Talk to your health care professional or pharmacist for more information. Do not breast-feed an infant while taking this medicine. What side effects may I notice from receiving this medicine? Side effects that you should report to your doctor or health care professional as soon as possible: -allergic reactions like skin rash, itching or hives, swelling of the face, lips, or tongue -low blood counts - this medicine may decrease the number of white blood cells, red blood cells and platelets. You may be at increased risk for infections and  bleeding. -signs of infection - fever or chills, cough, sore throat,  pain or difficulty passing urine -signs of decreased platelets or bleeding - bruising, pinpoint red spots on the skin, black, tarry stools, blood in the urine -signs of decreased red blood cells - unusually weak or tired, fainting spells, lightheadedness -breathing problems -changes in vision -mouth or throat sores or ulcers -pain, redness, swelling or irritation at the injection site -pain, tingling, numbness in the hands or feet -redness, blistering, peeling or loosening of the skin, including inside the mouth -seizures -vomiting Side effects that usually do not require medical attention (report to your doctor or health care professional if they continue or are bothersome): -diarrhea -hair loss -loss of appetite -nausea -stomach pain This list may not describe all possible side effects. Call your doctor for medical advice about side effects. You may report side effects to FDA at 1-800-FDA-1088. Where should I keep my medicine? This drug is given in a hospital or clinic and will not be stored at home. NOTE: This sheet is a summary. It may not cover all possible information. If you have questions about this medicine, talk to your doctor, pharmacist, or health care provider.  2014, [employer]vier/Gold Standard. (2007-09-20 17:24:12) Dexamethasone tablets What is this medicine? DEXAMETHASONE (dex a METH a sone) is a corticosteroid. It is commonly used to treat inflammation of the skin, joints, lungs, and other organs. Common conditions treated include asthma, allergies, and arthritis. It is also used for other conditions, such as blood disorders and diseases of the adrenal glands. This medicine may be used for other purposes; ask your health care provider or pharmacist if you have questions. COMMON BRAND NAME(S): Decadron, DexPak Dorothea Ogle, DexPak TaperPak, Zema-Pak What should I tell my health care provider before I take  this medicine? They need to know if you have any of these conditions: -Cushing's syndrome -diabetes -glaucoma -heart problems or disease -high blood pressure -infection like herpes, measles, tuberculosis, or chickenpox -kidney disease -liver disease -mental problems -myasthenia gravis -osteoporosis -previous heart attack -seizures -stomach, ulcer or intestine disease including colitis and diverticulitis -thyroid problem -an unusual or allergic reaction to dexamethasone, corticosteroids, other medicines, lactose, foods, dyes, or preservatives -pregnant or trying to get pregnant -breast-feeding How should I use this medicine? Take this medicine by mouth with a drink of water. Follow the directions on the prescription label. Take it with food or milk to avoid stomach upset. If you are taking this medicine once a day, take it in the morning. Do not take more medicine than you are told to take. Do not suddenly stop taking your medicine because you may develop a severe reaction. Your doctor will tell you how much medicine to take. If your doctor wants you to stop the medicine, the dose may be slowly lowered over time to avoid any side effects. Talk to your pediatrician regarding the use of this medicine in children. Special care may be needed. Patients over 73 years old may have a stronger reaction and need a smaller dose. Overdosage: If you think you have taken too much of this medicine contact a poison control center or emergency room at once. NOTE: This medicine is only for you. Do not share this medicine with others. What if I miss a dose? If you miss a dose, take it as soon as you can. If it is almost time for your next dose, talk to your doctor or health care professional. You may need to miss a dose or take an extra dose. Do not take double or extra doses without advice. What  may interact with this medicine? Do not take this medicine with any of the following  medications: -mifepristone, RU-486 -vaccines This medicine may also interact with the following medications: -amphotericin B -antibiotics like clarithromycin, erythromycin, and troleandomycin -aspirin and aspirin-like drugs -barbiturates like phenobarbital -carbamazepine -cholestyramine -cholinesterase inhibitors like donepezil, galantamine, rivastigmine, and tacrine -cyclosporine -digoxin -diuretics -ephedrine -male hormones, like estrogens or progestins and birth control pills -indinavir -isoniazid -ketoconazole -medicines for diabetes -medicines that improve muscle tone or strength for conditions like myasthenia gravis -NSAIDs, medicines for pain and inflammation, like ibuprofen or naproxen -phenytoin -rifampin -thalidomide -warfarin This list may not describe all possible interactions. Give your health care provider a list of all the medicines, herbs, non-prescription drugs, or dietary supplements you use. Also tell them if you smoke, drink alcohol, or use illegal drugs. Some items may interact with your medicine. What should I watch for while using this medicine? Visit your doctor or health care professional for regular checks on your progress. If you are taking this medicine over a prolonged period, carry an identification card with your name and address, the type and dose of your medicine, and your doctor's name and address. This medicine may increase your risk of getting an infection. Stay away from people who are sick. Tell your doctor or health care professional if you are around anyone with measles or chickenpox. If you are going to have surgery, tell your doctor or health care professional that you have taken this medicine within the last twelve months. Ask your doctor or health care professional about your diet. You may need to lower the amount of salt you eat. The medicine can increase your blood sugar. If you are a diabetic check with your doctor if you need help  adjusting the dose of your diabetic medicine. What side effects may I notice from receiving this medicine? Side effects that you should report to your doctor or health care professional as soon as possible: -allergic reactions like skin rash, itching or hives, swelling of the face, lips, or tongue -changes in vision -fever, sore throat, sneezing, cough, or other signs of infection, wounds that will not heal -increased thirst -mental depression, mood swings, mistaken feelings of self importance or of being mistreated -pain in hips, back, ribs, arms, shoulders, or legs -redness, blistering, peeling or loosening of the skin, including inside the mouth -trouble passing urine or change in the amount of urine -swelling of feet or lower legs -unusual bleeding or bruising Side effects that usually do not require medical attention (report to your doctor or health care professional if they continue or are bothersome): -headache -nausea, vomiting -skin problems, acne, thin and shiny skin -weight gain This list may not describe all possible side effects. Call your doctor for medical advice about side effects. You may report side effects to FDA at 1-800-FDA-1088. Where should I keep my medicine? Keep out of the reach of children. Store at room temperature between 20 and 25 degrees C (68 and 77 degrees F). Protect from light. Throw away any unused medicine after the expiration date. NOTE: This sheet is a summary. It may not cover all possible information. If you have questions about this medicine, talk to your doctor, pharmacist, or health care provider.  2014, [employer] Standard. (2007-09-09 14:02:13) Ondansetron injection What is this medicine? ONDANSETRON (on DAN se tron) is used to treat nausea and vomiting caused by chemotherapy. It is also used to prevent or treat nausea and vomiting after surgery. This medicine may be used for  other purposes; ask your health care provider or pharmacist if  you have questions. COMMON BRAND NAME(S): Zofran What should I tell my health care provider before I take this medicine? They need to know if you have any of these conditions: -heart disease -history of irregular heartbeat -liver disease -low levels of magnesium or potassium in the blood -an unusual or allergic reaction to ondansetron, granisetron, other medicines, foods, dyes, or preservatives -pregnant or trying to get pregnant -breast-feeding How should I use this medicine? This medicine is for infusion into a vein. It is given by a health care professional in a hospital or clinic setting. Talk to your pediatrician regarding the use of this medicine in children. Special care may be needed. Overdosage: If you think you have taken too much of this medicine contact a poison control center or emergency room at once. NOTE: This medicine is only for you. Do not share this medicine with others. What if I miss a dose? This does not apply. What may interact with this medicine? Do not take this medicine with any of the following medications: -apomorphine -cisapride -dofetilide -dronedarone -pimozide -thioridazine -ziprasidone  This medicine may also interact with the following medications: -carbamazepine -phenytoin -rifampicin -tramadol -other medicines that prolong the QT interval (cause an abnormal heart rhythm) This list may not describe all possible interactions. Give your health care provider a list of all the medicines, herbs, non-prescription drugs, or dietary supplements you use. Also tell them if you smoke, drink alcohol, or use illegal drugs. Some items may interact with your medicine. What should I watch for while using this medicine? Your condition will be monitored carefully while you are receiving this medicine. What side effects may I notice from receiving this medicine? Side effects that you should report to your doctor or health care professional as soon as  possible: -allergic reactions like skin rash, itching or hives, swelling of the face, lips, or tongue -breathing problems -dizziness -fast or irregular heartbeat -feeling faint or lightheaded, falls -fever and chills -swelling of the hands and feet -tightness in the chest Side effects that usually do not require medical attention (report to your doctor or health care professional if they continue or are bothersome): -constipation or diarrhea -headache This list may not describe all possible side effects. Call your doctor for medical advice about side effects. You may report side effects to FDA at 1-800-FDA-1088. Where should I keep my medicine? This drug is given in a hospital or clinic and will not be stored at home. NOTE: This sheet is a summary. It may not cover all possible information. If you have questions about this medicine, talk to your doctor, pharmacist, or health care provider.  2014, [employer]/Gold Standard. (2012-05-20 15:47:34) Metoclopramide tablets What is this medicine? METOCLOPRAMIDE (met oh kloe PRA mide) is used to treat the symptoms of gastroesophageal reflux disease (GERD) like heartburn. It is also used to treat people with slow emptying of the stomach and intestinal tract. This medicine may be used for other purposes; ask your health care provider or pharmacist if you have questions. COMMON BRAND NAME(S): Reglan What should I tell my health care provider before I take this medicine? They need to know if you have any of these conditions: -breast cancer -depression -diabetes -heart failure -high blood pressure -kidney disease -liver disease -Parkinson's disease or a movement disorder -pheochromocytoma -seizures -stomach obstruction, bleeding, or perforation -an unusual or allergic reaction to metoclopramide, procainamide, sulfites, other medicines, foods, dyes, or preservatives -pregnant or trying to  get pregnant -breast-feeding How should I use this  medicine? Take this medicine by mouth with a glass of water. Follow the directions on the prescription label. Take this medicine on an empty stomach, about 30 minutes before eating. Take your doses at regular intervals. Do not take your medicine more often than directed. Do not stop taking except on the advice of your doctor or health care professional. A special MedGuide will be given to you by the pharmacist with each prescription and refill. Be sure to read this information carefully each time. Talk to your pediatrician regarding the use of this medicine in children. Special care may be needed. Overdosage: If you think you have taken too much of this medicine contact a poison control center or emergency room at once. NOTE: This medicine is only for you. Do not share this medicine with others. What if I miss a dose? If you miss a dose, take it as soon as you can. If it is almost time for your next dose, take only that dose. Do not take double or extra doses. What may interact with this medicine? -acetaminophen -cyclosporine -digoxin -medicines for blood pressure -medicines for diabetes, including insulin -medicines for hay fever and other allergies -medicines for depression, especially an Monoamine Oxidase Inhibitor (MAOI) -medicines for Parkinson's disease, like levodopa -medicines for sleep or for pain -tetracycline This list may not describe all possible interactions. Give your health care provider a list of all the medicines, herbs, non-prescription drugs, or dietary supplements you use. Also tell them if you smoke, drink alcohol, or use illegal drugs. Some items may interact with your medicine. What should I watch for while using this medicine? It may take a few weeks for your stomach condition to start to get better. However, do not take this medicine for longer than 12 weeks. The longer you take this medicine, and the more you take it, the greater your chances are of developing serious  side effects. If you are an elderly patient, a male patient, or you have diabetes, you may be at an increased risk for side effects from this medicine. Contact your doctor immediately if you start having movements you cannot control such as lip smacking, rapid movements of the tongue, involuntary or uncontrollable movements of the eyes, head, arms and legs, or muscle twitches and spasms. Patients and their families should watch out for worsening depression or thoughts of suicide. Also watch out for any sudden or severe changes in feelings such as feeling anxious, agitated, panicky, irritable, hostile, aggressive, impulsive, severely restless, overly excited and hyperactive, or not being able to sleep. If this happens, especially at the beginning of treatment or after a change in dose, call your doctor. Do not treat yourself for high fever. Ask your doctor or health care professional for advice. You may get drowsy or dizzy. Do not drive, use machinery, or do anything that needs mental alertness until you know how this drug affects you. Do not stand or sit up quickly, especially if you are an older patient. This reduces the risk of dizzy or fainting spells. Alcohol can make you more drowsy and dizzy. Avoid alcoholic drinks. What side effects may I notice from receiving this medicine? Side effects that you should report to your doctor or health care professional as soon as possible: -allergic reactions like skin rash, itching or hives, swelling of the face, lips, or tongue -abnormal production of milk in females -breast enlargement in both males and females -change in the way you  walk -difficulty moving, speaking or swallowing -drooling, lip smacking, or rapid movements of the tongue -excessive sweating -fever -involuntary or uncontrollable movements of the eyes, head, arms and legs -irregular heartbeat or palpitations -muscle twitches and spasms -unusually weak or tired Side effects that usually do  not require medical attention (report to your doctor or health care professional if they continue or are bothersome): -change in sex drive or performance -depressed mood -diarrhea -difficulty sleeping -headache -menstrual changes -restless or nervous This list may not describe all possible side effects. Call your doctor for medical advice about side effects. You may report side effects to FDA at 1-800-FDA-1088. Where should I keep my medicine? Keep out of the reach of children. Store at room temperature between 20 and 25 degrees C (68 and 77 degrees F). Protect from light. Keep container tightly closed. Throw away any unused medicine after the expiration date. NOTE: This sheet is a summary. It may not cover all possible information. If you have questions about this medicine, talk to your doctor, pharmacist, or health care provider.  2014, [employer]/Gold Standard. (2011-09-16 13:04:38) Prochlorperazine tablets What is this medicine? PROCHLORPERAZINE (proe klor PER a zeen) helps to control severe nausea and vomiting. This medicine is also used to treat schizophrenia. It can also help patients who experience anxiety that is not due to psychological illness. This medicine may be used for other purposes; ask your health care provider or pharmacist if you have questions. COMMON BRAND NAME(S): Compazine What should I tell my health care provider before I take this medicine? They need to know if you have any of these conditions: -blood disorders or disease -dementia -liver disease or jaundice -Parkinson's disease -uncontrollable movement disorder -an unusual or allergic reaction to prochlorperazine, other medicines, foods, dyes, or preservatives -pregnant or trying to get pregnant -breast-feeding How should I use this medicine? Take this medicine by mouth with a glass of water. Follow the directions on the prescription label. Take your doses at regular intervals. Do not take your medicine more  often than directed. Do not stop taking this medicine suddenly. This can cause nausea, vomiting, and dizziness. Ask your doctor or health care professional for advice. Talk to your pediatrician regarding the use of this medicine in children. Special care may be needed. While this drug may be prescribed for children as young as 2 years for selected conditions, precautions do apply. Overdosage: If you think you have taken too much of this medicine contact a poison control center or emergency room at once. NOTE: This medicine is only for you. Do not share this medicine with others. What if I miss a dose? If you miss a dose, take it as soon as you can. If it is almost time for your next dose, take only that dose. Do not take double or extra doses. What may interact with this medicine? Do not take this medicine with any of the following medications: -amoxapine -antidepressants like citalopram, escitalopram, fluoxetine, paroxetine, and sertraline -deferoxamine -dofetilide -maprotiline -tricyclic antidepressants like amitriptyline, clomipramine, imipramine, nortiptyline and others This medicine may also interact with the following medications: -lithium -medicines for pain -phenytoin -propranolol -warfarin This list may not describe all possible interactions. Give your health care provider a list of all the medicines, herbs, non-prescription drugs, or dietary supplements you use. Also tell them if you smoke, drink alcohol, or use illegal drugs. Some items may interact with your medicine. What should I watch for while using this medicine? Visit your doctor or health care professional for  regular checks on your progress. You may get drowsy or dizzy. Do not drive, use machinery, or do anything that needs mental alertness until you know how this medicine affects you. Do not stand or sit up quickly, especially if you are an older patient. This reduces the risk of dizzy or fainting spells. Alcohol may  interfere with the effect of this medicine. Avoid alcoholic drinks. This medicine can reduce the response of your body to heat or cold. Dress warm in cold weather and stay hydrated in hot weather. If possible, avoid extreme temperatures like saunas, hot tubs, very hot or cold showers, or activities that can cause dehydration such as vigorous exercise. This medicine can make you more sensitive to the sun. Keep out of the sun. If you cannot avoid being in the sun, wear protective clothing and use sunscreen. Do not use sun lamps or tanning beds/booths. Your mouth may get dry. Chewing sugarless gum or sucking hard candy, and drinking plenty of water may help. Contact your doctor if the problem does not go away or is severe. What side effects may I notice from receiving this medicine? Side effects that you should report to your doctor or health care professional as soon as possible: -blurred vision -breast enlargement in men or women -breast milk in women who are not breast-feeding -chest pain, fast or irregular heartbeat -confusion, restlessness -dark yellow or brown urine -difficulty breathing or swallowing -dizziness or fainting spells -drooling, shaking, movement difficulty (shuffling walk) or rigidity -fever, chills, sore throat -involuntary or uncontrollable movements of the eyes, mouth, head, arms, and legs -seizures -stomach area pain -unusually weak or tired -unusual bleeding or bruising -yellowing of skin or eyes Side effects that usually do not require medical attention (report to your doctor or health care professional if they continue or are bothersome): -difficulty passing urine -difficulty sleeping -headache -sexual dysfunction -skin rash, or itching This list may not describe all possible side effects. Call your doctor for medical advice about side effects. You may report side effects to FDA at 1-800-FDA-1088. Where should I keep my medicine? Keep out of the reach of  children. Store at room temperature between 15 and 30 degrees C (59 and 86 degrees F). Protect from light. Throw away any unused medicine after the expiration date. NOTE: This sheet is a summary. It may not cover all possible information. If you have questions about this medicine, talk to your doctor, pharmacist, or health care provider.  2014, [employer]/Gold Standard. (2011-10-07 16:59:39) Carboplatin injection What is this medicine? CARBOPLATIN (KAR boe pla tin) is a chemotherapy drug. It targets fast dividing cells, like cancer cells, and causes these cells to die. This medicine is used to treat ovarian cancer and many other cancers. This medicine may be used for other purposes; ask your health care provider or pharmacist if you have questions. COMMON BRAND NAME(S): Paraplatin What should I tell my health care provider before I take this medicine? They need to know if you have any of these conditions: -blood disorders -hearing problems -kidney disease -recent or ongoing radiation therapy -an unusual or allergic reaction to carboplatin, cisplatin, other chemotherapy, other medicines, foods, dyes, or preservatives -pregnant or trying to get pregnant -breast-feeding How should I use this medicine? This drug is usually given as an infusion into a vein. It is administered in a hospital or clinic by a specially trained health care professional. Talk to your pediatrician regarding the use of this medicine in children. Special care may be needed. Overdosage: If  you think you have taken too much of this medicine contact a poison control center or emergency room at once. NOTE: This medicine is only for you. Do not share this medicine with others. What if I miss a dose? It is important not to miss a dose. Call your doctor or health care professional if you are unable to keep an appointment. What may interact with this medicine? -medicines for seizures -medicines to increase blood counts like  filgrastim, pegfilgrastim, sargramostim -some antibiotics like amikacin, gentamicin, neomycin, streptomycin, tobramycin -vaccines Talk to your doctor or health care professional before taking any of these medicines: -acetaminophen -aspirin -ibuprofen -ketoprofen -naproxen This list may not describe all possible interactions. Give your health care provider a list of all the medicines, herbs, non-prescription drugs, or dietary supplements you use. Also tell them if you smoke, drink alcohol, or use illegal drugs. Some items may interact with your medicine. What should I watch for while using this medicine? Your condition will be monitored carefully while you are receiving this medicine. You will need important blood work done while you are taking this medicine. This drug may make you feel generally unwell. This is not uncommon, as chemotherapy can affect healthy cells as well as cancer cells. Report any side effects. Continue your course of treatment even though you feel ill unless your doctor tells you to stop. In some cases, you may be given additional medicines to help with side effects. Follow all directions for their use. Call your doctor or health care professional for advice if you get a fever, chills or sore throat, or other symptoms of a cold or flu. Do not treat yourself. This drug decreases your body's ability to fight infections. Try to avoid being around people who are sick. This medicine may increase your risk to bruise or bleed. Call your doctor or health care professional if you notice any unusual bleeding. Be careful brushing and flossing your teeth or using a toothpick because you may get an infection or bleed more easily. If you have any dental work done, tell your dentist you are receiving this medicine. Avoid taking products that contain aspirin, acetaminophen, ibuprofen, naproxen, or ketoprofen unless instructed by your doctor. These medicines may hide a fever. Do not become  pregnant while taking this medicine. Women should inform their doctor if they wish to become pregnant or think they might be pregnant. There is a potential for serious side effects to an unborn child. Talk to your health care professional or pharmacist for more information. Do not breast-feed an infant while taking this medicine. What side effects may I notice from receiving this medicine? Side effects that you should report to your doctor or health care professional as soon as possible: -allergic reactions like skin rash, itching or hives, swelling of the face, lips, or tongue -signs of infection - fever or chills, cough, sore throat, pain or difficulty passing urine -signs of decreased platelets or bleeding - bruising, pinpoint red spots on the skin, black, tarry stools, nosebleeds -signs of decreased red blood cells - unusually weak or tired, fainting spells, lightheadedness -breathing problems -changes in hearing -changes in vision -chest pain -high blood pressure -low blood counts - This drug may decrease the number of white blood cells, red blood cells and platelets. You may be at increased risk for infections and bleeding. -nausea and vomiting -pain, swelling, redness or irritation at the injection site -pain, tingling, numbness in the hands or feet -problems with balance, talking, walking -trouble passing urine  or change in the amount of urine Side effects that usually do not require medical attention (report to your doctor or health care professional if they continue or are bothersome): -hair loss -loss of appetite -metallic taste in the mouth or changes in taste This list may not describe all possible side effects. Call your doctor for medical advice about side effects. You may report side effects to FDA at 1-800-FDA-1088. Where should I keep my medicine? This drug is given in a hospital or clinic and will not be stored at home. NOTE: This sheet is a summary. It may not cover all  possible information. If you have questions about this medicine, talk to your doctor, pharmacist, or health care provider.  2014, [employer] Standard. (2007-08-24 14:38:05) Implanted Port Instructions An implanted port is a central line that has a round shape and is placed under the skin. It is used for long-term IV (intravenous) access for:  Medicine.  Fluids.  Liquid nutrition, such as TPN (total parenteral nutrition).  Blood samples. Ports can be placed:  In the chest area just below the collarbone (this is the most common place.)  In the arms.  In the belly (abdomen) area.  In the legs. PARTS OF THE PORT A port has 2 main parts:  The reservoir. The reservoir is round, disc-shaped, and will be a small, raised area under your skin.  The reservoir is the part where a needle is inserted (accessed) to either give medicines or to draw blood.  The catheter. The catheter is a long, slender tube that extends from the reservoir. The catheter is placed into a large vein.  Medicine that is inserted into the reservoir goes into the catheter and then into the vein. INSERTION OF THE PORT  The port is surgically placed in either an operating room or in a procedural area (interventional radiology).  Medicine may be given to help you relax during the procedure.  The skin where the port will be inserted is numbed (local anesthetic).  1 or 2 small cuts (incisions) will be made in the skin to insert the port.  The port can be used after it has been inserted. INCISION SITE CARE  The incision site may have small adhesive strips on it. This helps keep the incision site closed. Sometimes, no adhesive strips are placed. Instead of adhesive strips, a special kind of surgical glue is used to keep the incision closed.  If adhesive strips were placed on the incision sites, do not take them off. They will fall off on their own.  The incision site may be sore for 1 to 2 days. Pain medicine  can help.  Do not get the incision site wet. Bathe or shower as directed by your caregiver.  The incision site should heal in 5 to 7 days. A small scar may form after the incision has healed. ACCESSING THE PORT Special steps must be taken to access the port:  Before the port is accessed, a numbing cream can be placed on the skin. This helps numb the skin over the port site.  A sterile technique is used to access the port.  The port is accessed with a needle. Only "non-coring" port needles should be used to access the port. Once the port is accessed, a blood return should be checked. This helps ensure the port is in the vein and is not clogged (clotted).  If your caregiver believes your port should remain accessed, a clear (transparent) bandage will be placed over the  needle site. The bandage and needle will need to be changed every week or as directed by your caregiver.  Keep the bandage covering the needle clean and dry. Do not get it wet. Follow your caregiver's instructions on how to take a shower or bath when the port is accessed.  If your port does not need to stay accessed, no bandage is needed over the port. FLUSHING THE PORT Flushing the port keeps it from getting clogged. How often the port is flushed depends on:  If a constant infusion is running. If a constant infusion is running, the port may not need to be flushed.  If intermittent medicines are given.  If the port is not being used. For intermittent medicines:  The port will need to be flushed:  After medicines have been given.  After blood has been drawn.  As part of routine maintenance.  A port is normally flushed with:  Normal saline.  Heparin.  Follow your caregiver's advice on how often, how much, and the type of flush to use on your port. IMPORTANT PORT INFORMATION  Tell your caregiver if you are allergic to heparin.  After your port is placed, you will get a manufacturer's information card. The  card has information about your port. Keep this card with you at all times.  There are many types of ports available. Know what kind of port you have.  In case of an emergency, it may be helpful to wear a medical alert bracelet. This can help alert health care workers that you have a port.  The port can stay in for as long as your caregiver believes it is necessary.  When it is time for the port to come out, surgery will be done to remove it. The surgery will be similar to how the port was put in.  If you are in the hospital or clinic:  Your port will be taken care of and flushed by a nurse.  If you are at home:  A home health care nurse may give medicines and take care of the port.  You or a family member can get special training and directions for giving medicine and taking care of the port at home. SEEK IMMEDIATE MEDICAL CARE IF:   Your port does not flush or you are unable to get a blood return.  New drainage or pus is coming from the incision.  A bad smell is coming from the incision site.  You develop swelling or increased redness at the incision site.  You develop increased swelling or pain at the port site.  You develop swelling or pain in the surrounding skin near the port.  You have an oral temperature above 102 F (38.9 C), not controlled by medicine. MAKE SURE YOU:   Understand these instructions.  Will watch your condition.  Will get help right away if you are not doing well or get worse. Document Released: 05/19/2005 Document Revised: 08/11/2011 Document Reviewed: 08/10/2008 [facility] Patient Information 2014 [place], [place]. Pegfilgrastim injection What is this medicine? PEGFILGRASTIM (peg fil GRA stim) helps the body make more white blood cells. It is used to prevent infection in people with low amounts of white blood cells following cancer treatment. This medicine may be used for other purposes; ask your health care provider or pharmacist if you have  questions. COMMON BRAND NAME(S): Neulasta What should I tell my health care provider before I take this medicine? They need to know if you have any of these conditions: -sickle  cell disease -an unusual or allergic reaction to pegfilgrastim, filgrastim, E.coli protein, other medicines, foods, dyes, or preservatives -pregnant or trying to get pregnant -breast-feeding How should I use this medicine? This medicine is for injection under the skin. It is usually given by a health care professional in a hospital or clinic setting. If you get this medicine at home, you will be taught how to prepare and give this medicine. Do not shake this medicine. Use exactly as directed. Take your medicine at regular intervals. Do not take your medicine more often than directed. It is important that you put your used needles and syringes in a special sharps container. Do not put them in a trash can. If you do not have a sharps container, call your pharmacist or healthcare provider to get one. Talk to your pediatrician regarding the use of this medicine in children. While this drug may be prescribed for children who weigh more than 45 kg for selected conditions, precautions do apply Overdosage: If you think you have taken too much of this medicine contact a poison control center or emergency room at once. NOTE: This medicine is only for you. Do not share this medicine with others. What if I miss a dose? If you miss a dose, take it as soon as you can. If it is almost time for your next dose, take only that dose. Do not take double or extra doses. What may interact with this medicine? -lithium -medicines for growth therapy This list may not describe all possible interactions. Give your health care provider a list of all the medicines, herbs, non-prescription drugs, or dietary supplements you use. Also tell them if you smoke, drink alcohol, or use illegal drugs. Some items may interact with your medicine. What should I  watch for while using this medicine? Visit your doctor for regular check ups. You will need important blood work done while you are taking this medicine. What side effects may I notice from receiving this medicine? Side effects that you should report to your doctor or health care professional as soon as possible: -allergic reactions like skin rash, itching or hives, swelling of the face, lips, or tongue -breathing problems -fever -pain, redness, or swelling where injected -shoulder pain -stomach or side pain Side effects that usually do not require medical attention (report to your doctor or health care professional if they continue or are bothersome): -aches, pains -headache -loss of appetite -nausea, vomiting -unusually tired This list may not describe all possible side effects. Call your doctor for medical advice about side effects. You may report side effects to FDA at 1-800-FDA-1088. Where should I keep my medicine? Keep out of the reach of children. Store in a refrigerator between 2 and 8 degrees C (36 and 46 degrees F). Do not freeze. Keep in carton to protect from light. Throw away this medicine if it is left out of the refrigerator for more than 48 hours. Throw away any unused medicine after the expiration date. NOTE: This sheet is a summary. It may not cover all possible information. If you have questions about this medicine, talk to your doctor, pharmacist, or health care provider.  2014, [employer]vier/Gold Standard. (2007-12-20 15:41:44)

## 2013-04-26 ENCOUNTER — Ambulatory Visit (HOSPITAL_COMMUNITY)
Admission: RE | Admit: 2013-04-26 | Discharge: 2013-04-26 | Disposition: A | Discharge status: Home / Self Care | Payer: Medicare Other | Source: Ambulatory Visit | Attending: Hematology and Oncology | Admitting: Hematology and Oncology

## 2013-04-26 ENCOUNTER — Other Ambulatory Visit (HOSPITAL_COMMUNITY): Payer: Medicare Other

## 2013-04-26 DIAGNOSIS — R5381 Other malaise: Secondary | ICD-10-CM | POA: Insufficient documentation

## 2013-04-26 DIAGNOSIS — G319 Degenerative disease of nervous system, unspecified: Secondary | ICD-10-CM | POA: Insufficient documentation

## 2013-04-26 DIAGNOSIS — C3492 Malignant neoplasm of unspecified part of left bronchus or lung: Secondary | ICD-10-CM

## 2013-04-26 DIAGNOSIS — C3490 Malignant neoplasm of unspecified part of unspecified bronchus or lung: Secondary | ICD-10-CM | POA: Insufficient documentation

## 2013-04-26 MED ORDER — GADOBENATE DIMEGLUMINE 529 MG/ML IV SOLN
15.0000 mL | Freq: Once | INTRAVENOUS | Status: AC | PRN
  Administered 2013-04-26: 15 mL via INTRAVENOUS

## 2013-04-27 ENCOUNTER — Ambulatory Visit (HOSPITAL_COMMUNITY)
Admission: RE | Admit: 2013-04-27 | Discharge: 2013-04-27 | Disposition: A | Discharge status: Home / Self Care | Payer: Medicare Other | Source: Ambulatory Visit | Attending: Interventional Radiology | Admitting: Interventional Radiology

## 2013-04-27 ENCOUNTER — Encounter (HOSPITAL_COMMUNITY): Payer: Self-pay

## 2013-04-27 ENCOUNTER — Other Ambulatory Visit (HOSPITAL_COMMUNITY): Payer: Self-pay | Admitting: Oncology

## 2013-04-27 ENCOUNTER — Ambulatory Visit (HOSPITAL_COMMUNITY)
Admission: RE | Admit: 2013-04-27 | Discharge: 2013-04-27 | Disposition: A | Discharge status: Home / Self Care | Payer: Medicare Other | Source: Ambulatory Visit | Attending: Oncology | Admitting: Oncology

## 2013-04-27 DIAGNOSIS — K219 Gastro-esophageal reflux disease without esophagitis: Secondary | ICD-10-CM | POA: Insufficient documentation

## 2013-04-27 DIAGNOSIS — E785 Hyperlipidemia, unspecified: Secondary | ICD-10-CM | POA: Insufficient documentation

## 2013-04-27 DIAGNOSIS — C3490 Malignant neoplasm of unspecified part of unspecified bronchus or lung: Secondary | ICD-10-CM

## 2013-04-27 DIAGNOSIS — I252 Old myocardial infarction: Secondary | ICD-10-CM | POA: Insufficient documentation

## 2013-04-27 DIAGNOSIS — I2510 Atherosclerotic heart disease of native coronary artery without angina pectoris: Secondary | ICD-10-CM | POA: Insufficient documentation

## 2013-04-27 DIAGNOSIS — Z951 Presence of aortocoronary bypass graft: Secondary | ICD-10-CM | POA: Insufficient documentation

## 2013-04-27 DIAGNOSIS — I10 Essential (primary) hypertension: Secondary | ICD-10-CM | POA: Insufficient documentation

## 2013-04-27 LAB — CBC
MCH: 25.6 pg — ABNORMAL LOW (ref 26.0–34.0)
MCHC: 31.4 g/dL (ref 30.0–36.0)
Platelets: 391 10*3/uL (ref 150–400)
RBC: 2.15 MIL/uL — ABNORMAL LOW (ref 4.22–5.81)

## 2013-04-27 LAB — ABO/RH: ABO/RH(D): O POS

## 2013-04-27 LAB — PREPARE RBC (CROSSMATCH)

## 2013-04-27 MED ORDER — CEFAZOLIN SODIUM-DEXTROSE 2-3 GM-% IV SOLR
2.0000 g | INTRAVENOUS | Status: AC
  Administered 2013-04-27: 2 g via INTRAVENOUS
  Filled 2013-04-27: qty 50

## 2013-04-27 MED ORDER — SODIUM CHLORIDE 0.9 % IV SOLN
INTRAVENOUS | Status: DC
  Administered 2013-04-27: 08:00:00 via INTRAVENOUS

## 2013-04-27 MED ORDER — FENTANYL CITRATE 0.05 MG/ML IJ SOLN
INTRAMUSCULAR | Status: AC
  Administered 2013-04-27: 100 ug
  Filled 2013-04-27: qty 2

## 2013-04-27 MED ORDER — HEPARIN SOD (PORK) LOCK FLUSH 100 UNIT/ML IV SOLN
500.0000 [IU] | INTRAVENOUS | Status: DC | PRN
  Filled 2013-04-27: qty 5

## 2013-04-27 MED ORDER — LIDOCAINE HCL 1 % IJ SOLN
INTRAMUSCULAR | Status: AC
  Filled 2013-04-27: qty 40

## 2013-04-27 MED ORDER — HEPARIN SOD (PORK) LOCK FLUSH 100 UNIT/ML IV SOLN
INTRAVENOUS | Status: AC
  Administered 2013-04-27: 500 [IU]
  Filled 2013-04-27: qty 5

## 2013-04-27 NOTE — Progress Notes (Signed)
CRITICAL VALUE ALERT  Critical value received:  hemoglobin 5.5  Date of notification:  04/27/13  Time of notification:  0830  Critical value read back:yes  Nurse who received alert:  Mardee Postin MD notified (1st page):  0830 Time of first page: 0830 MD notified (2nd page): Beckey Downing PA radiology verbally notified of this result and she will report lab results to Dr Rica Records  Time of second page:  Responding MD: as above Time MD responded: as above

## 2013-04-27 NOTE — Progress Notes (Signed)
Pt had port-a-cath placed today.  Pt received 2 units PRBC's today also without complication.  Pt d/c home with family in stable condition.

## 2013-04-27 NOTE — Procedures (Signed)
R IJ Port cathter placement with US and fluoroscopy No complication No blood loss. See complete dictation in [facility] P[facility].  

## 2013-04-27 NOTE — Progress Notes (Signed)
Patient ID: Jonathan Watson, male   DOB: 08-15-1940, 72 y.o.   MRN: [id number]   Informed Dr Jonette Eva- GI MD from [facility] hgb 5.5 today Pt known to Dr Darrick Penna regarding anemia Most recent transfusion 04/06/13 at [facility]  Will transfuse 2 units packed red blood cells per Dr Darrick Penna  Pt and family aware

## 2013-04-27 NOTE — H&P (Signed)
Jonathan Watson is an 72 y.o. male.   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 MI (myocardial infarction)   . Hypertension   . Hyperlipidemia   . Coronary artery disease   . Dementia 01/24/2013  . GERD (gastroesophageal reflux disease)   . Blood transfusion without reported diagnosis     Past Surgical History  Procedure Laterality Date  . Coronary artery bypass graft    . Cervical spine surgery    . Esophagogastroduodenoscopy N/A 01/15/2013    Dr. 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 Colonoscopy N/A 01/17/2013    Dr. Darrick Penna: normal TI, normal colon, small internal hemorrhoids  . Appendectomy    . Lymph node biopsy Right 04/2013    cervical    Family History  Problem Relation Age of Onset  . Colon cancer Neg Hx   . Hypertension Mother   . Hypertension Father    Social History:  reports that he quit smoking about 14 years ago. His smoking use included Cigarettes. He has a 37.5 pack-year smoking history. His smokeless tobacco use includes Snuff. He reports that he does not drink alcohol or use illicit drugs.  Allergies: No Known Allergies   (Not in a hospital admission)  Results for orders placed during the hospital encounter of 04/27/13 (from the past 48 hour(s))  CBC     Status: Abnormal   Collection Time    04/27/13  8:05 AM      Result Value Range   WBC 7.9  4.0 - 10.5 K/uL   RBC 2.15 (*) 4.22 - 5.81 MIL/uL   Hemoglobin 5.5 (*) 13.0 - 17.0 g/dL   Comment: REPEATED TO VERIFY     CRITICAL RESULT CALLED  TO, READ BACK BY AND VERIFIED WITH:     J. CHILTON RN AT 0825 ON 11.26.14 BY SHUEA   HCT 17.5 (*) 39.0 - 52.0 %   MCV 81.4  78.0 - 100.0 fL   MCH 25.6 (*) 26.0 - 34.0 pg   MCHC 31.4  30.0 - 36.0 g/dL   RDW 56.2 (*) 13.0 - 86.5 %   Platelets 391  150 - 400 K/uL   Mr Laqueta Jean Wo Contrast  04/26/2013   CLINICAL DATA:  New lung cancer.  Weakness.  EXAM: MRI HEAD WITHOUT AND WITH CONTRAST  TECHNIQUE: Multiplanar, multiecho pulse sequences of the brain and surrounding structures were obtained without and with intravenous contrast.  CONTRAST:  15mL MULTIHANCE GADOBENATE DIMEGLUMINE 529 MG/ML IV SOLN  COMPARISON:  Head CT 01/16/2013  FINDINGS: Prominence of the ventricles and sulci are compatible with moderate generalized cerebral atrophy, slightly advanced for age. Foci of T2 hyperintensity within the periventricular white matter are nonspecific but compatible with mild chronic small vessel ischemic disease. There is no evidence of acute infarct, intracranial hemorrhage, mass, midline shift, or extra-axial fluid collection. Major intracranial flow voids are unremarkable. Orbits are normal. Visualized paranasal sinuses and mastoid air cells are clear. Calvarium is normal in signal. There is no abnormal enhancement.  IMPRESSION: No evidence of intracranial metastatic  disease. Moderate cerebral atrophy.   Electronically Signed   By: Sebastian Ache   On: 04/26/2013 14:03    Review of Systems  Constitutional: Positive for weight loss. Negative for fever.  Respiratory: Negative for shortness of breath.   Cardiovascular: Negative for chest pain.  Gastrointestinal: Positive for blood in stool. Negative for nausea, vomiting and abdominal pain.  Musculoskeletal: Negative for falls.  Neurological: Positive for weakness. Negative for dizziness.    Blood pressure 139/60, pulse 102, temperature 97.6 F (36.4 C), temperature source Oral, resp. rate 20, height 5\' 7"  (1.702 m), weight 167 lb (75.751 kg), SpO2  100.00%. Physical Exam  Constitutional: He is oriented to person, place, and time.  Weak appearing  Cardiovascular: Normal rate, regular rhythm and normal heart sounds.   No murmur heard. Respiratory: Effort normal and breath sounds normal. He has no wheezes.  GI: Bowel sounds are normal. There is no tenderness.  Musculoskeletal: Normal range of motion.  Neurological: He is alert and oriented to person, place, and time.  Skin: Skin is warm and dry. There is pallor.  Psychiatric: He has a normal mood and affect. His behavior is normal. Judgment and thought content normal.     Assessment/Plan Small cell lung ca Scheduled for PAC today Pt aware of procedure benefits and risks and agreeable to proceed Consent signed and in chart  Hgb 5.5 today (8.9 11/18) Pt has hx anemia requiring periodic transfusion Small bowel tumor enlargement causing bowel bleed per pts family Most recent tx 04/2013 per pt   Jonathan Watson A 04/27/2013, 8:50 AM

## 2013-04-28 LAB — TYPE AND SCREEN: Unit division: 0

## 2013-05-02 ENCOUNTER — Encounter (HOSPITAL_COMMUNITY): Payer: Medicare Other | Attending: Gastroenterology

## 2013-05-02 DIAGNOSIS — D500 Iron deficiency anemia secondary to blood loss (chronic): Secondary | ICD-10-CM | POA: Insufficient documentation

## 2013-05-02 DIAGNOSIS — C3490 Malignant neoplasm of unspecified part of unspecified bronchus or lung: Secondary | ICD-10-CM | POA: Insufficient documentation

## 2013-05-02 DIAGNOSIS — D649 Anemia, unspecified: Secondary | ICD-10-CM

## 2013-05-02 DIAGNOSIS — D62 Acute posthemorrhagic anemia: Secondary | ICD-10-CM | POA: Insufficient documentation

## 2013-05-02 DIAGNOSIS — R591 Generalized enlarged lymph nodes: Secondary | ICD-10-CM

## 2013-05-02 DIAGNOSIS — R918 Other nonspecific abnormal finding of lung field: Secondary | ICD-10-CM

## 2013-05-02 DIAGNOSIS — K922 Gastrointestinal hemorrhage, unspecified: Secondary | ICD-10-CM | POA: Insufficient documentation

## 2013-05-02 DIAGNOSIS — R599 Enlarged lymph nodes, unspecified: Secondary | ICD-10-CM | POA: Insufficient documentation

## 2013-05-02 DIAGNOSIS — R222 Localized swelling, mass and lump, trunk: Secondary | ICD-10-CM | POA: Insufficient documentation

## 2013-05-02 LAB — CBC WITH DIFFERENTIAL/PLATELET
Basophils Absolute: 0.1 10*3/uL (ref 0.0–0.1)
HCT: 22.5 % — ABNORMAL LOW (ref 39.0–52.0)
Hemoglobin: 7 g/dL — ABNORMAL LOW (ref 13.0–17.0)
Lymphocytes Relative: 10 % — ABNORMAL LOW (ref 12–46)
Monocytes Absolute: 0.7 10*3/uL (ref 0.1–1.0)
Monocytes Relative: 8 % (ref 3–12)
Neutro Abs: 6.6 10*3/uL (ref 1.7–7.7)
Platelets: 381 10*3/uL (ref 150–400)
RDW: 16.8 % — ABNORMAL HIGH (ref 11.5–15.5)
WBC: 8.4 10*3/uL (ref 4.0–10.5)

## 2013-05-02 LAB — PREPARE RBC (CROSSMATCH)

## 2013-05-03 ENCOUNTER — Encounter (HOSPITAL_BASED_OUTPATIENT_CLINIC_OR_DEPARTMENT_OTHER): Payer: Medicare Other

## 2013-05-03 ENCOUNTER — Other Ambulatory Visit (HOSPITAL_COMMUNITY): Payer: Medicare Other

## 2013-05-03 VITALS — BP 122/56 | HR 70 | Temp 99.1°F | Resp 16

## 2013-05-03 DIAGNOSIS — K922 Gastrointestinal hemorrhage, unspecified: Secondary | ICD-10-CM

## 2013-05-03 DIAGNOSIS — C3490 Malignant neoplasm of unspecified part of unspecified bronchus or lung: Secondary | ICD-10-CM

## 2013-05-03 MED ORDER — SODIUM CHLORIDE 0.9 % IJ SOLN: 10.0000 mL | INTRAMUSCULAR | Status: DC | PRN

## 2013-05-03 MED ORDER — HEPARIN SOD (PORK) LOCK FLUSH 100 UNIT/ML IV SOLN
500.0000 [IU] | Freq: Every day | INTRAVENOUS | Status: AC | PRN
  Administered 2013-05-03: 500 [IU]
  Filled 2013-05-03: qty 5

## 2013-05-03 MED ORDER — SODIUM CHLORIDE 0.9 % IV SOLN
250.0000 mL | Freq: Once | INTRAVENOUS | Status: AC
  Administered 2013-05-03: 250 mL via INTRAVENOUS

## 2013-05-03 NOTE — Progress Notes (Signed)
Jonathan Watson tolerated two units of blood well today.  Left ambulatory with two sons.

## 2013-05-04 LAB — TYPE AND SCREEN
Antibody Screen: NEGATIVE
Unit division: 0
Unit division: 0

## 2013-05-09 ENCOUNTER — Encounter (HOSPITAL_BASED_OUTPATIENT_CLINIC_OR_DEPARTMENT_OTHER): Payer: Medicare Other

## 2013-05-09 ENCOUNTER — Other Ambulatory Visit (HOSPITAL_COMMUNITY): Payer: Medicare Other

## 2013-05-09 VITALS — BP 133/66 | HR 92 | Temp 97.2°F | Resp 20 | Wt 163.2 lb

## 2013-05-09 DIAGNOSIS — Z5111 Encounter for antineoplastic chemotherapy: Secondary | ICD-10-CM

## 2013-05-09 DIAGNOSIS — C778 Secondary and unspecified malignant neoplasm of lymph nodes of multiple regions: Secondary | ICD-10-CM

## 2013-05-09 DIAGNOSIS — C3492 Malignant neoplasm of unspecified part of left bronchus or lung: Secondary | ICD-10-CM

## 2013-05-09 DIAGNOSIS — D649 Anemia, unspecified: Secondary | ICD-10-CM

## 2013-05-09 DIAGNOSIS — R918 Other nonspecific abnormal finding of lung field: Secondary | ICD-10-CM

## 2013-05-09 DIAGNOSIS — R591 Generalized enlarged lymph nodes: Secondary | ICD-10-CM

## 2013-05-09 DIAGNOSIS — C3490 Malignant neoplasm of unspecified part of unspecified bronchus or lung: Secondary | ICD-10-CM

## 2013-05-09 DIAGNOSIS — D500 Iron deficiency anemia secondary to blood loss (chronic): Secondary | ICD-10-CM

## 2013-05-09 LAB — CBC WITH DIFFERENTIAL/PLATELET
Basophils Relative: 1 % (ref 0–1)
Eosinophils Absolute: 0.3 10*3/uL (ref 0.0–0.7)
Hemoglobin: 7 g/dL — ABNORMAL LOW (ref 13.0–17.0)
MCH: 25.9 pg — ABNORMAL LOW (ref 26.0–34.0)
MCHC: 32.1 g/dL (ref 30.0–36.0)
Neutro Abs: 5.5 10*3/uL (ref 1.7–7.7)
Neutrophils Relative %: 73 % (ref 43–77)
Platelets: 381 10*3/uL (ref 150–400)
RDW: 15.8 % — ABNORMAL HIGH (ref 11.5–15.5)
WBC: 7.6 10*3/uL (ref 4.0–10.5)

## 2013-05-09 LAB — COMPREHENSIVE METABOLIC PANEL
ALT: 14 U/L (ref 0–53)
Albumin: 2.4 g/dL — ABNORMAL LOW (ref 3.5–5.2)
Alkaline Phosphatase: 66 U/L (ref 39–117)
Calcium: 8.9 mg/dL (ref 8.4–10.5)
Chloride: 98 mEq/L (ref 96–112)
GFR calc non Af Amer: 63 mL/min — ABNORMAL LOW (ref >90)
Potassium: 4 mEq/L (ref 3.5–5.1)
Sodium: 133 mEq/L — ABNORMAL LOW (ref 135–145)
Total Protein: 6.5 g/dL (ref 6.0–8.3)

## 2013-05-09 LAB — PREPARE RBC (CROSSMATCH)

## 2013-05-09 MED ORDER — HEPARIN SOD (PORK) LOCK FLUSH 100 UNIT/ML IV SOLN
500.0000 [IU] | Freq: Once | INTRAVENOUS | Status: AC | PRN
  Administered 2013-05-09: 500 [IU]
  Filled 2013-05-09: qty 5

## 2013-05-09 MED ORDER — SODIUM CHLORIDE 0.9 % IV SOLN
437.0000 mg | Freq: Once | INTRAVENOUS | Status: AC
  Administered 2013-05-09: 440 mg via INTRAVENOUS
  Filled 2013-05-09: qty 44

## 2013-05-09 MED ORDER — SODIUM CHLORIDE 0.9 % IV SOLN: 16.0000 mg | Freq: Once | INTRAVENOUS | Status: DC

## 2013-05-09 MED ORDER — SODIUM CHLORIDE 0.9 % IJ SOLN: 10.0000 mL | INTRAMUSCULAR | Status: DC | PRN

## 2013-05-09 MED ORDER — SODIUM CHLORIDE 0.9 % IV SOLN
Freq: Once | INTRAVENOUS | Status: AC
  Administered 2013-05-09: 11:00:00 via INTRAVENOUS

## 2013-05-09 MED ORDER — ONDANSETRON HCL 40 MG/20ML IJ SOLN
Freq: Once | INTRAMUSCULAR | Status: AC
  Administered 2013-05-09: 8 mg via INTRAVENOUS
  Filled 2013-05-09: qty 8

## 2013-05-09 MED ORDER — DEXAMETHASONE SODIUM PHOSPHATE 10 MG/ML IJ SOLN: 20.0000 mg | Freq: Once | INTRAMUSCULAR | Status: DC

## 2013-05-09 MED ORDER — SODIUM CHLORIDE 0.9 % IV SOLN
100.0000 mg/m2 | Freq: Once | INTRAVENOUS | Status: AC
  Administered 2013-05-09: 190 mg via INTRAVENOUS
  Filled 2013-05-09: qty 9.5

## 2013-05-10 ENCOUNTER — Encounter (HOSPITAL_BASED_OUTPATIENT_CLINIC_OR_DEPARTMENT_OTHER): Payer: Medicare Other

## 2013-05-10 ENCOUNTER — Other Ambulatory Visit (HOSPITAL_COMMUNITY): Payer: Medicare Other

## 2013-05-10 VITALS — BP 113/51 | HR 75 | Temp 97.5°F | Resp 16

## 2013-05-10 DIAGNOSIS — Z5111 Encounter for antineoplastic chemotherapy: Secondary | ICD-10-CM

## 2013-05-10 DIAGNOSIS — C778 Secondary and unspecified malignant neoplasm of lymph nodes of multiple regions: Secondary | ICD-10-CM

## 2013-05-10 DIAGNOSIS — R591 Generalized enlarged lymph nodes: Secondary | ICD-10-CM

## 2013-05-10 DIAGNOSIS — C3492 Malignant neoplasm of unspecified part of left bronchus or lung: Secondary | ICD-10-CM

## 2013-05-10 DIAGNOSIS — C3490 Malignant neoplasm of unspecified part of unspecified bronchus or lung: Secondary | ICD-10-CM

## 2013-05-10 DIAGNOSIS — D500 Iron deficiency anemia secondary to blood loss (chronic): Secondary | ICD-10-CM

## 2013-05-10 MED ORDER — SODIUM CHLORIDE 0.9 % IV SOLN
250.0000 mL | Freq: Once | INTRAVENOUS | Status: AC
  Administered 2013-05-10: 250 mL via INTRAVENOUS

## 2013-05-10 MED ORDER — SODIUM CHLORIDE 0.9 % IV SOLN
Freq: Once | INTRAVENOUS | Status: AC
  Administered 2013-05-10: 10:00:00 via INTRAVENOUS

## 2013-05-10 MED ORDER — SODIUM CHLORIDE 0.9 % IV SOLN
100.0000 mg/m2 | Freq: Once | INTRAVENOUS | Status: AC
  Administered 2013-05-10: 190 mg via INTRAVENOUS
  Filled 2013-05-10: qty 9.5

## 2013-05-10 MED ORDER — ACETAMINOPHEN 325 MG PO TABS
650.0000 mg | ORAL_TABLET | ORAL | Status: AC
  Administered 2013-05-10: 650 mg via ORAL

## 2013-05-10 MED ORDER — FUROSEMIDE 10 MG/ML IJ SOLN
20.0000 mg | Freq: Once | INTRAMUSCULAR | Status: AC
  Administered 2013-05-10: 20 mg via INTRAVENOUS
  Filled 2013-05-10: qty 2

## 2013-05-10 MED ORDER — ACETAMINOPHEN 325 MG PO TABS
ORAL_TABLET | ORAL | Status: AC
  Filled 2013-05-10: qty 2

## 2013-05-10 MED ORDER — SODIUM CHLORIDE 0.9 % IJ SOLN
10.0000 mL | INTRAMUSCULAR | Status: DC | PRN
  Administered 2013-05-10: 10 mL

## 2013-05-10 MED ORDER — DIPHENHYDRAMINE HCL 50 MG/ML IJ SOLN: 25.0000 mg | Freq: Once | INTRAMUSCULAR | Status: DC

## 2013-05-10 MED ORDER — DIPHENHYDRAMINE HCL 25 MG PO CAPS
25.0000 mg | ORAL_CAPSULE | ORAL | Status: AC
  Administered 2013-05-10: 25 mg via ORAL

## 2013-05-10 MED ORDER — DIPHENHYDRAMINE HCL 25 MG PO CAPS
ORAL_CAPSULE | ORAL | Status: AC
  Filled 2013-05-10: qty 1

## 2013-05-10 MED ORDER — HEPARIN SOD (PORK) LOCK FLUSH 100 UNIT/ML IV SOLN
500.0000 [IU] | Freq: Once | INTRAVENOUS | Status: AC | PRN
  Administered 2013-05-10: 500 [IU]
  Filled 2013-05-10: qty 5

## 2013-05-10 MED ORDER — PROCHLORPERAZINE MALEATE 10 MG PO TABS
10.0000 mg | ORAL_TABLET | Freq: Once | ORAL | Status: AC
  Administered 2013-05-10: 10 mg via ORAL
  Filled 2013-05-10: qty 1

## 2013-05-11 ENCOUNTER — Encounter (HOSPITAL_BASED_OUTPATIENT_CLINIC_OR_DEPARTMENT_OTHER): Payer: Medicare Other

## 2013-05-11 VITALS — BP 153/79 | HR 83 | Temp 96.8°F | Resp 16

## 2013-05-11 DIAGNOSIS — Z5111 Encounter for antineoplastic chemotherapy: Secondary | ICD-10-CM

## 2013-05-11 DIAGNOSIS — C3492 Malignant neoplasm of unspecified part of left bronchus or lung: Secondary | ICD-10-CM

## 2013-05-11 DIAGNOSIS — C7951 Secondary malignant neoplasm of bone: Secondary | ICD-10-CM

## 2013-05-11 DIAGNOSIS — D500 Iron deficiency anemia secondary to blood loss (chronic): Secondary | ICD-10-CM

## 2013-05-11 DIAGNOSIS — C3490 Malignant neoplasm of unspecified part of unspecified bronchus or lung: Secondary | ICD-10-CM

## 2013-05-11 DIAGNOSIS — R591 Generalized enlarged lymph nodes: Secondary | ICD-10-CM

## 2013-05-11 LAB — TYPE AND SCREEN
ABO/RH(D): O POS
Unit division: 0

## 2013-05-11 MED ORDER — HEPARIN SOD (PORK) LOCK FLUSH 100 UNIT/ML IV SOLN
500.0000 [IU] | Freq: Once | INTRAVENOUS | Status: AC | PRN
  Administered 2013-05-11: 500 [IU]
  Filled 2013-05-11: qty 5

## 2013-05-11 MED ORDER — SODIUM CHLORIDE 0.9 % IJ SOLN
10.0000 mL | INTRAMUSCULAR | Status: DC | PRN
  Administered 2013-05-11: 10 mL

## 2013-05-11 MED ORDER — SODIUM CHLORIDE 0.9 % IV SOLN
100.0000 mg/m2 | Freq: Once | INTRAVENOUS | Status: AC
  Administered 2013-05-11: 190 mg via INTRAVENOUS
  Filled 2013-05-11: qty 9.5

## 2013-05-11 MED ORDER — SODIUM CHLORIDE 0.9 % IV SOLN
Freq: Once | INTRAVENOUS | Status: AC
  Administered 2013-05-11: 09:00:00 via INTRAVENOUS

## 2013-05-11 MED ORDER — PROCHLORPERAZINE MALEATE 10 MG PO TABS
10.0000 mg | ORAL_TABLET | Freq: Once | ORAL | Status: AC
  Administered 2013-05-11: 10 mg via ORAL
  Filled 2013-05-11: qty 1

## 2013-05-12 ENCOUNTER — Encounter (HOSPITAL_BASED_OUTPATIENT_CLINIC_OR_DEPARTMENT_OTHER): Payer: Medicare Other

## 2013-05-12 VITALS — BP 132/73 | HR 87 | Temp 96.9°F | Resp 16

## 2013-05-12 DIAGNOSIS — R591 Generalized enlarged lymph nodes: Secondary | ICD-10-CM

## 2013-05-12 DIAGNOSIS — D500 Iron deficiency anemia secondary to blood loss (chronic): Secondary | ICD-10-CM

## 2013-05-12 DIAGNOSIS — C3490 Malignant neoplasm of unspecified part of unspecified bronchus or lung: Secondary | ICD-10-CM

## 2013-05-12 DIAGNOSIS — C778 Secondary and unspecified malignant neoplasm of lymph nodes of multiple regions: Secondary | ICD-10-CM

## 2013-05-12 DIAGNOSIS — C3492 Malignant neoplasm of unspecified part of left bronchus or lung: Secondary | ICD-10-CM

## 2013-05-12 DIAGNOSIS — Z5189 Encounter for other specified aftercare: Secondary | ICD-10-CM

## 2013-05-12 MED ORDER — PEGFILGRASTIM INJECTION 6 MG/0.6ML
SUBCUTANEOUS | Status: AC
  Filled 2013-05-12: qty 0.6

## 2013-05-12 MED ORDER — PEGFILGRASTIM INJECTION 6 MG/0.6ML
6.0000 mg | Freq: Once | SUBCUTANEOUS | Status: AC
  Administered 2013-05-12: 6 mg via SUBCUTANEOUS

## 2013-05-12 MED ORDER — PEGFILGRASTIM INJECTION 6 MG/0.6ML: 6.0000 mg | Freq: Once | SUBCUTANEOUS | Status: DC

## 2013-05-12 NOTE — Progress Notes (Signed)
Jonathan Watson presents today for injection per MD orders. Neulasta 6mg administered SQ in left Abdomen. Administration without incident. Patient tolerated well.  

## 2013-05-16 ENCOUNTER — Encounter (HOSPITAL_BASED_OUTPATIENT_CLINIC_OR_DEPARTMENT_OTHER): Payer: Medicare Other

## 2013-05-16 ENCOUNTER — Encounter (HOSPITAL_COMMUNITY): Payer: Self-pay

## 2013-05-16 ENCOUNTER — Encounter (HOSPITAL_COMMUNITY): Payer: Medicare Other

## 2013-05-16 VITALS — BP 147/69 | HR 78 | Temp 98.5°F | Resp 16 | Wt 160.0 lb

## 2013-05-16 DIAGNOSIS — K5520 Angiodysplasia of colon without hemorrhage: Secondary | ICD-10-CM

## 2013-05-16 DIAGNOSIS — C3490 Malignant neoplasm of unspecified part of unspecified bronchus or lung: Secondary | ICD-10-CM

## 2013-05-16 DIAGNOSIS — D500 Iron deficiency anemia secondary to blood loss (chronic): Secondary | ICD-10-CM

## 2013-05-16 DIAGNOSIS — D649 Anemia, unspecified: Secondary | ICD-10-CM

## 2013-05-16 DIAGNOSIS — R599 Enlarged lymph nodes, unspecified: Secondary | ICD-10-CM

## 2013-05-16 DIAGNOSIS — R918 Other nonspecific abnormal finding of lung field: Secondary | ICD-10-CM

## 2013-05-16 DIAGNOSIS — C3492 Malignant neoplasm of unspecified part of left bronchus or lung: Secondary | ICD-10-CM

## 2013-05-16 DIAGNOSIS — R591 Generalized enlarged lymph nodes: Secondary | ICD-10-CM

## 2013-05-16 DIAGNOSIS — R21 Rash and other nonspecific skin eruption: Secondary | ICD-10-CM

## 2013-05-16 DIAGNOSIS — F17200 Nicotine dependence, unspecified, uncomplicated: Secondary | ICD-10-CM

## 2013-05-16 LAB — CBC WITH DIFFERENTIAL/PLATELET
Basophils Absolute: 0 10*3/uL (ref 0.0–0.1)
Basophils Relative: 0 % (ref 0–1)
Eosinophils Relative: 4 % (ref 0–5)
HCT: 29.6 % — ABNORMAL LOW (ref 39.0–52.0)
Hemoglobin: 9.5 g/dL — ABNORMAL LOW (ref 13.0–17.0)
MCH: 25.7 pg — ABNORMAL LOW (ref 26.0–34.0)
MCHC: 32.1 g/dL (ref 30.0–36.0)
MCV: 80.2 fL (ref 78.0–100.0)
Monocytes Absolute: 0.1 10*3/uL (ref 0.1–1.0)
Monocytes Relative: 4 % (ref 3–12)
Neutro Abs: 1.6 10*3/uL — ABNORMAL LOW (ref 1.7–7.7)
Neutrophils Relative %: 69 % (ref 43–77)
RDW: 15.7 % — ABNORMAL HIGH (ref 11.5–15.5)

## 2013-05-16 MED ORDER — NYSTATIN-TRIAMCINOLONE 100000-0.1 UNIT/GM-% EX OINT: 1.0000 "application " | TOPICAL_OINTMENT | Freq: Two times a day (BID) | CUTANEOUS | Status: AC

## 2013-05-16 NOTE — Patient Instructions (Signed)
[facility] Discharge Instructions  RECOMMENDATIONS MADE BY THE CONSULTANT AND ANY TEST RESULTS WILL BE SENT TO YOUR REFERRING PHYSICIAN.  You are doing well.  We will continue with current plans for chemotherapy. You have a fungus rash at your armpit. A prescription for an antifungal ointment has been sent to your pharmacy. Wash the area well with an antibacterial soap (Dial), pat the area dry, dust the area lightly with pure corn starch. Allow to dry completely before applying ointment. Return as scheduled for appointments.  Thank you for choosing [facility] to provide your oncology and hematology care.  To afford each patient quality time with our providers, please arrive at least 15 minutes before your scheduled appointment time.  With your help, our goal is to use those 15 minutes to complete the necessary work-up to ensure our physicians have the information they need to help with your evaluation and healthcare recommendations.    Effective January 1st, 2014, we ask that you re-schedule your appointment with our physicians should you arrive 10 or more minutes late for your appointment.  We strive to give you quality time with our providers, and arriving late affects you and other patients whose appointments are after yours.    Again, thank you for choosing [facility].  Our hope is that these requests will decrease the amount of time that you wait before being seen by our physicians.       _____________________________________________________________  Should you have questions after your visit to [facility], please contact our office at [phone] between the hours of 8:30 a.m. and 5:00 p.m.  Voicemails left after 4:30 p.m. will not be returned until the following business day.  For prescription refill requests, have your pharmacy contact our office with your prescription refill request.

## 2013-05-16 NOTE — Progress Notes (Signed)
Anthonymichael R Kearn presented for labwork. Labs per MD order drawn via Peripheral Line 23 gauge needle inserted in left antecubital.  Good blood return present. Procedure without incident.  Needle removed intact. Patient tolerated procedure well.

## 2013-05-16 NOTE — Progress Notes (Signed)
See MD exam notes 

## 2013-05-16 NOTE — Progress Notes (Signed)
[facility]  OFFICE PROGRESS NOTE  Colon Branch, MD [address] [place] [address]  DIAGNOSIS: Small cell lung cancer, left  Lymphadenopathy, generalized  Arteriovenous malformation of gastrointestinal tract  Anemia due to blood loss, chronic  Rash axillary  Chief Complaint  Patient presents with  . Lung Cancer    Extensive stage small cell  . 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 male returns for followup after receiving first cycle of carboplatin/VP-16 with Neulasta support for extensive stage small cell lung cancer but not involving the brain. 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 He tolerated chemotherapy well with no episodes of nausea or vomiting. He did develop bone pain after Neulasta but did not require any analgesics. Stools are still black. He denies any cough, wheezing, sore throat, but has developed a rash in the right axilla which is beginning as well in the left axilla. He denies any dysuria, incontinence, fever, night sweats, abdominal pain or distention, lower extremity swelling or redness, diarrhea, constipation, headache, or seizures.  MEDICAL HISTORY: Past Medical History  Diagnosis Date  . MI (myocardial infarction)   . Hypertension   . Hyperlipidemia   . Coronary artery disease   . Dementia 01/24/2013  . GERD (gastroesophageal reflux disease)   . Blood transfusion without reported diagnosis     INTERIM HISTORY: has Acute blood loss anemia; Upper GI bleed; CAD (coronary artery disease); GERD (gastroesophageal reflux disease); Hyperlipidemia; Dark stools; Dizziness; GI bleed; Dementia; Essential  hypertension, benign; Other and unspecified hyperlipidemia; Small bowel mass; Helicobacter pylori gastritis (chronic gastritis); Angina pectoris; Lung mass; Bowel wall thickening; Anemia due to blood loss, chronic; Arteriovenous malformation of gastrointestinal tract; Lymphadenopathy, generalized; and Small cell lung cancer on his problem list.   Patient underwent cervical lymph node fine needle aspiration on 04/19/2013 at which time small cell carcinoma was diagnosed. This finding in conjunction with a large lung mass and diffuse lymphadenopathy involving the abdomen and mediastinum are wall consistent with extensive stage small cell lung cancer  ALLERGIES:  has No Known Allergies.  MEDICATIONS: has a current medication list which includes the following prescription(s): acetaminophen, allopurinol, dexamethasone, lidocaine-prilocaine, metoclopramide, omeprazole, prochlorperazine, and nystatin-triamcinolone ointment.  SURGICAL HISTORY:  Past Surgical History  Procedure Laterality Date  . Coronary artery bypass graft    . Cervical spine surgery    . Esophagogastroduodenoscopy N/A 01/15/2013    Dr. 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 Colonoscopy N/A 01/17/2013    Dr. Darrick Penna: normal TI, normal colon, small internal hemorrhoids  . Appendectomy    . Lymph node biopsy Right 04/2013    cervical    FAMILY HISTORY: family history includes Hypertension in his father and mother. There is no history of Colon cancer.  SOCIAL HISTORY:  reports that he quit smoking about 14 years ago. His smoking use included Cigarettes. He has a 37.5 pack-year smoking history. His smokeless tobacco use includes Snuff. He reports that he does not drink alcohol or use illicit drugs.  REVIEW OF SYSTEMS:  Other than that discussed above is noncontributory.  PHYSICAL EXAMINATION: ECOG PERFORMANCE STATUS: 1 - Symptomatic but completely ambulatory  Blood pressure  147/69, pulse 78,  temperature 98.5 F (36.9 C), temperature source Oral, resp. rate 16, weight 160 lb (72.576 kg).  GENERAL:alert, no distress and comfortable. Slight pallor. SKIN: Right axilla with area of erythema without purulence or bleeding. Minimal changes in the left axilla. EYES: PERLA; Conjunctiva are pink and non-injected, sclera clear OROPHARYNX:no exudate, no erythema on lips, buccal mucosa, or tongue. NECK: supple, thyroid normal size, non-tender, without nodularity. No masses CHEST: Increased AP diameter with light port in place. LYMPH:  no palpable lymphadenopathy in the cervical, axillary or inguinal LUNGS: clear to auscultation and percussion with normal breathing effort HEART: regular rate & rhythm and no murmurs. ABDOMEN:abdomen soft, non-tender and normal bowel sounds MUSCULOSKELETAL:no cyanosis of digits and no clubbing. Range of motion normal.  NEURO: alert & oriented x 3 with fluent speech, no focal motor/sensory deficits   LABORATORY DATA: Infusion on 05/16/2013  Component Date Value Range Status  . WBC 05/16/2013 2.2* 4.0 - 10.5 K/uL Final  . RBC 05/16/2013 3.69* 4.22 - 5.81 MIL/uL Final  . Hemoglobin 05/16/2013 9.5* 13.0 - 17.0 g/dL Final  . HCT 16/03/9603 29.6* 39.0 - 52.0 % Final  . MCV 05/16/2013 80.2  78.0 - 100.0 fL Final  . MCH 05/16/2013 25.7* 26.0 - 34.0 pg Final  . MCHC 05/16/2013 32.1  30.0 - 36.0 g/dL Final  . RDW 54/01/8118 15.7* 11.5 - 15.5 % Final  . Platelets 05/16/2013 287  150 - 400 K/uL Final  . Neutrophils Relative % 05/16/2013 69  43 - 77 % Final  . Neutro Abs 05/16/2013 1.6* 1.7 - 7.7 K/uL Final  . Lymphocytes Relative 05/16/2013 23  12 - 46 % Final  . Lymphs Abs 05/16/2013 0.5* 0.7 - 4.0 K/uL Final  . Monocytes Relative 05/16/2013 4  3 - 12 % Final  . Monocytes Absolute 05/16/2013 0.1  0.1 - 1.0 K/uL Final  . Eosinophils Relative 05/16/2013 4  0 - 5 % Final  . Eosinophils Absolute 05/16/2013 0.1  0.0 - 0.7 K/uL Final  . Basophils Relative 05/16/2013  0  0 - 1 % Final  . Basophils Absolute 05/16/2013 0.0  0.0 - 0.1 K/uL Final  Infusion on 05/09/2013  Component Date Value Range Status  . WBC 05/09/2013 7.6  4.0 - 10.5 K/uL Final  . RBC 05/09/2013 2.63* 4.22 - 5.81 MIL/uL Final  . Hemoglobin 05/09/2013 7.0* 13.0 - 17.0 g/dL Final  . HCT 14/78/2956 21.2* 39.0 - 52.0 % Final  . MCV 05/09/2013 80.6  78.0 - 100.0 fL Final  . MCH 05/09/2013 25.9* 26.0 - 34.0 pg Final  . MCHC 05/09/2013 32.1  30.0 - 36.0 g/dL Final  . RDW 21/30/8657 15.8* 11.5 - 15.5 % Final  . Platelets 05/09/2013 381  150 - 400 K/uL Final  . Neutrophils Relative % 05/09/2013 73  43 - 77 % Final  . Neutro Abs 05/09/2013 5.5  1.7 - 7.7 K/uL Final  . Lymphocytes Relative 05/09/2013 15  12 - 46 % Final  . Lymphs Abs 05/09/2013 1.1  0.7 - 4.0 K/uL Final  . Monocytes Relative 05/09/2013 8  3 - 12 % Final  . Monocytes Absolute 05/09/2013 0.6  0.1 - 1.0 K/uL Final  . Eosinophils Relative 05/09/2013 3  0 - 5 % Final  . Eosinophils Absolute 05/09/2013 0.3  0.0 - 0.7 K/uL Final  . Basophils Relative 05/09/2013 1  0 - 1 % Final  . Basophils Absolute 05/09/2013 0.1  0.0 - 0.1 K/uL Final  . Sodium 05/09/2013 133*  135 - 145 mEq/L Final  . Potassium 05/09/2013 4.0  3.5 - 5.1 mEq/L Final  . Chloride 05/09/2013 98  96 - 112 mEq/L Final  . CO2 05/09/2013 22  19 - 32 mEq/L Final  . Glucose, Bld 05/09/2013 96  70 - 99 mg/dL Final  . BUN 16/03/9603 20  6 - 23 mg/dL Final  . Creatinine, Ser 05/09/2013 1.13  0.50 - 1.35 mg/dL Final  . Calcium 54/01/8118 8.9  8.4 - 10.5 mg/dL Final  . Total Protein 05/09/2013 6.5  6.0 - 8.3 g/dL Final  . Albumin 14/78/2956 2.4* 3.5 - 5.2 g/dL Final  . AST 21/30/8657 29  0 - 37 U/L Final  . ALT 05/09/2013 14  0 - 53 U/L Final  . Alkaline Phosphatase 05/09/2013 66  39 - 117 U/L Final  . Total Bilirubin 05/09/2013 0.2* 0.3 - 1.2 mg/dL Final  . GFR calc non Af Amer 05/09/2013 63* >90 mL/min Final  . GFR calc Af Amer 05/09/2013 73* >90 mL/min Final   Comment:  (NOTE)                          The eGFR has been calculated using the CKD EPI equation.                          This calculation has not been validated in all clinical situations.                          eGFR's persistently <90 mL/min signify possible Chronic Kidney                          Disease.  . ABO/RH(D) 05/09/2013 O POS   Final  . Antibody Screen 05/09/2013 NEG   Final  . Sample Expiration 05/09/2013 05/12/2013   Final  . Unit Number 05/09/2013 [id number]   Final  . Blood Component Type 05/09/2013 RED CELLS,LR   Final  . Unit division 05/09/2013 00   Final  . Status of Unit 05/09/2013 ISSUED,FINAL   Final  . Transfusion Status 05/09/2013 OK TO TRANSFUSE   Final  . Crossmatch Result 05/09/2013 Compatible   Final  . Unit Number 05/09/2013 [id number]   Final  . Blood Component Type 05/09/2013 RED CELLS,LR   Final  . Unit division 05/09/2013 00   Final  . Status of Unit 05/09/2013 ISSUED,FINAL   Final  . Transfusion Status 05/09/2013 OK TO TRANSFUSE   Final  . Crossmatch Result 05/09/2013 Compatible   Final  . Order Confirmation 05/09/2013 ORDER PROCESSED BY BLOOD BANK   Final  Infusion on 05/02/2013  Component Date Value Range Status  . WBC 05/02/2013 8.4  4.0 - 10.5 K/uL Final  . RBC 05/02/2013 2.68* 4.22 - 5.81 MIL/uL Final  . Hemoglobin 05/02/2013 7.0* 13.0 - 17.0 g/dL Final  . HCT 44/07/4740 22.5* 39.0 - 52.0 % Final  . MCV 05/02/2013 84.0  78.0 - 100.0 fL Final  . MCH 05/02/2013 26.1  26.0 - 34.0 pg Final  . MCHC 05/02/2013 31.1  30.0 - 36.0 g/dL Final  . RDW 59/56/3875 16.8* 11.5 - 15.5 % Final  . Platelets 05/02/2013 381  150 - 400 K/uL Final  . Neutrophils Relative % 05/02/2013 78* 43 - 77 % Final  . Neutro Abs 05/02/2013 6.6  1.7 - 7.7 K/uL Final  . Lymphocytes Relative 05/02/2013  10* 12 - 46 % Final  . Lymphs Abs 05/02/2013 0.9  0.7 - 4.0 K/uL Final  . Monocytes Relative 05/02/2013 8  3 - 12 % Final  . Monocytes Absolute 05/02/2013 0.7  0.1 - 1.0 K/uL  Final  . Eosinophils Relative 05/02/2013 3  0 - 5 % Final  . Eosinophils Absolute 05/02/2013 0.2  0.0 - 0.7 K/uL Final  . Basophils Relative 05/02/2013 1  0 - 1 % Final  . Basophils Absolute 05/02/2013 0.1  0.0 - 0.1 K/uL Final  . Order Confirmation 05/02/2013 ORDER PROCESSED BY BLOOD BANK   Final  . ABO/RH(D) 05/02/2013 O POS   Final  . Antibody Screen 05/02/2013 NEG   Final  . Sample Expiration 05/02/2013 05/05/2013   Final  . Unit Number 05/02/2013 [id number]   Final  . Blood Component Type 05/02/2013 RED CELLS,LR   Final  . Unit division 05/02/2013 00   Final  . Status of Unit 05/02/2013 ISSUED,FINAL   Final  . Transfusion Status 05/02/2013 OK TO TRANSFUSE   Final  . Crossmatch Result 05/02/2013 Compatible   Final  . Unit Number 05/02/2013 [id number]   Final  . Blood Component Type 05/02/2013 RED CELLS,LR   Final  . Unit division 05/02/2013 00   Final  . Status of Unit 05/02/2013 ISSUED,FINAL   Final  . Transfusion Status 05/02/2013 OK TO TRANSFUSE   Final  . Crossmatch Result 05/02/2013 Compatible   Final  Hospital Outpatient Visit on 04/27/2013  Component Date Value Range Status  . WBC 04/27/2013 7.9  4.0 - 10.5 K/uL Final  . RBC 04/27/2013 2.15* 4.22 - 5.81 MIL/uL Final  . Hemoglobin 04/27/2013 5.5* 13.0 - 17.0 g/dL Final   Comment: REPEATED TO VERIFY                          CRITICAL RESULT CALLED TO, READ BACK BY AND VERIFIED WITH:                          J. CHILTON RN AT 0825 ON 11.26.14 BY SHUEA  . HCT 04/27/2013 17.5* 39.0 - 52.0 % Final  . MCV 04/27/2013 81.4  78.0 - 100.0 fL Final  . MCH 04/27/2013 25.6* 26.0 - 34.0 pg Final  . MCHC 04/27/2013 31.4  30.0 - 36.0 g/dL Final  . RDW 62/95/2841 17.1* 11.5 - 15.5 % Final  . Platelets 04/27/2013 391  150 - 400 K/uL Final  . Order Confirmation 04/27/2013 ORDER PROCESSED BY BLOOD BANK   Final  . ABO/RH(D) 04/27/2013 O POS   Final  . Antibody Screen 04/27/2013 NEG   Final  . Sample Expiration 04/27/2013 04/30/2013    Final  . Unit Number 04/27/2013 [id number]   Final  . Blood Component Type 04/27/2013 RED CELLS,LR   Final  . Unit division 04/27/2013 00   Final  . Status of Unit 04/27/2013 ISSUED,FINAL   Final  . Transfusion Status 04/27/2013 OK TO TRANSFUSE   Final  . Crossmatch Result 04/27/2013 Compatible   Final  . Unit Number 04/27/2013 [id number]   Final  . Blood Component Type 04/27/2013 RBC LR PHER1   Final  . Unit division 04/27/2013 00   Final  . Status of Unit 04/27/2013 ISSUED,FINAL   Final  . Transfusion Status 04/27/2013 OK TO TRANSFUSE   Final  . Crossmatch Result 04/27/2013 Compatible   Final  Hospital Outpatient Visit on 04/27/2013  Component Date Value  Range Status  . ABO/RH(D) 04/27/2013 O POS   Final  Hospital Outpatient Visit on 04/19/2013  Component Date Value Range Status  . aPTT 04/19/2013 34  24 - 37 seconds Final  . WBC 04/19/2013 8.4  4.0 - 10.5 K/uL Final  . RBC 04/19/2013 3.31* 4.22 - 5.81 MIL/uL Final  . Hemoglobin 04/19/2013 8.9* 13.0 - 17.0 g/dL Final  . HCT 16/03/9603 27.1* 39.0 - 52.0 % Final  . MCV 04/19/2013 81.9  78.0 - 100.0 fL Final  . MCH 04/19/2013 26.9  26.0 - 34.0 pg Final  . MCHC 04/19/2013 32.8  30.0 - 36.0 g/dL Final  . RDW 54/01/8118 16.3* 11.5 - 15.5 % Final  . Platelets 04/19/2013 443* 150 - 400 K/uL Final  . Prothrombin Time 04/19/2013 13.6  11.6 - 15.2 seconds Final  . INR 04/19/2013 1.06  0.00 - 1.49 Final    PATHOLOGY: Diagnosis 04/19/2013 Lymph node for lymphoma, Above right cervical  - POORLY DIFFERENTIATED CARCINOMA CONSISTENT WITH SMALL CELL CARCINOMA.  Urinalysis    Component Value Date/Time   COLORURINE YELLOW 01/14/2013 1938   APPEARANCEUR CLEAR 01/14/2013 1938   LABSPEC 1.015 01/14/2013 1938   PHURINE 5.5 01/14/2013 1938   GLUCOSEU NEGATIVE 01/14/2013 1938   HGBUR NEGATIVE 01/14/2013 1938   BILIRUBINUR NEGATIVE 01/14/2013 1938   KETONESUR NEGATIVE 01/14/2013 1938   PROTEINUR NEGATIVE 01/14/2013 1938   UROBILINOGEN 0.2  01/14/2013 1938   NITRITE NEGATIVE 01/14/2013 1938   LEUKOCYTESUR NEGATIVE 01/14/2013 1938    RADIOGRAPHIC STUDIES: NM PET Image Initial (PI) Whole Body Status: Final result         PACS Images    Show images for NM PET Image Initial (PI) Whole Body         Study Result    CLINICAL DATA: Initial treatment strategy for staging of lung  mass.  EXAM:  NUCLEAR MEDICINE PET SKULL BASE TO THIGH  FASTING BLOOD GLUCOSE: Value: 82mg /dl  TECHNIQUE:  14.7 mCi W-29 FDG was injected intravenously. CT data was obtained  and used for attenuation correction and anatomic localization only.  (This was not acquired as a diagnostic CT examination.) Additional  exam technical data entered on technologist worksheet.  COMPARISON: Abdominal pelvic CT 01/16/2013. Chest CT 01/14/2013  FINDINGS:  NECK  Bilateral hypermetabolic cervical adenopathy. Index conglomerate of  nodes in the left level 2 station measure up to 1.5 cm and a S.U.V.  max of 15.4 on image 31/ series 2.  CHEST  Right upper lobe lung mass with adjacent contiguous adenopathy which  extends into the right-sided mediastinum. The superior most mass  measures 4.0 x 4.3 cm today versus 3.5 x 3.6 cm on 01/14/2013 (when  remeasured).  The direct extension versus adenopathy has also enlarged in the  interval. This measures on the order of 4.6 cm. The conglomerate  measures a S.U.V. max of 22.5.  ABDOMEN/PELVIS  Bilateral hypermetabolic adrenal nodules. The larger is on the left  and measures 1.6 cm.  Interval development of hypermetabolic adenopathy within the small  bowel mesentery since 01/16/2013. Index node measures 2.5 cm and a  S.U.V. max of 26.4 on image 180/series 2.  Progression above small bowel all wall thickening and development of  dilatation. Index left-sided small bowel lesion measures a S.U.V.  max of 21.7 on image 175/ series 2. There is also a partially  cavitary lesion immediately adjacent and anterior to the  urinary  bladder on image 212.  SKELETON  No abnormal marrow activity.  CT IMAGES  PERFORMED FOR ATTENUATION CORRECTION  Carotid atherosclerosis. Cervical spine fixation. Cerebral atrophy.  Prior median sternotomy. Punctate left renal calculus. Aortic  atherosclerosis.  IMPRESSION:  1. Progressive findings within the abdomen and pelvis which are  highly suspicious for lymphoma.  2. Progression of right upper lobe pulmonary and nodal/mediastinal  process. Favor lymphoma as well. A synchronous primary bronchogenic  carcinoma could look similar.  3. Hypermetabolic cervical adenopathy which is presumably related to  lymphoma.  4. Bilateral adrenal metastasis/ lymphomatous involvement.  If tissue sampling has not already been performed, the cervical  nodes would likely be amenable to percutaneous sampling.  Alternatively, the thoracic process may require sampling in order to  exclude synchronous primaries.  5. Incidental findings, including left nephrolithiasis.  Electronically Signed  By: Jeronimo Greaves M.D.  On: 04/08/2013      Mr Laqueta Jean QM Contrast  04/26/2013   CLINICAL DATA:  New lung cancer.  Weakness.  EXAM: MRI HEAD WITHOUT AND WITH CONTRAST  TECHNIQUE: Multiplanar, multiecho pulse sequences of the brain and surrounding structures were obtained without and with intravenous contrast.  CONTRAST:  15mL MULTIHANCE GADOBENATE DIMEGLUMINE 529 MG/ML IV SOLN  COMPARISON:  Head CT 01/16/2013  FINDINGS: Prominence of the ventricles and sulci are compatible with moderate generalized cerebral atrophy, slightly advanced for age. Foci of T2 hyperintensity within the periventricular white matter are nonspecific but compatible with mild chronic small vessel ischemic disease. There is no evidence of acute infarct, intracranial hemorrhage, mass, midline shift, or extra-axial fluid collection. Major intracranial flow voids are unremarkable. Orbits are normal. Visualized paranasal sinuses and mastoid air  cells are clear. Calvarium is normal in signal. There is no abnormal enhancement.  IMPRESSION: No evidence of intracranial metastatic disease. Moderate cerebral atrophy.   Electronically Signed   By: Sebastian Ache   On: 04/26/2013 14:03   US Biopsy  04/19/2013   CLINICAL DATA:  Cervical PET positive adenopathy, suspect lymphoma versus metastatic lung cancer  EXAM: ULTRASOUND GUIDED core BIOPSY OF right cervical adenopathy  MEDICATIONS: None.  PROCEDURE: The procedure, risks, benefits, and alternatives were explained to the patient. Questions regarding the procedure were encouraged and answered. The patient understands and consents to the procedure.  The right neck was prepped with Betadine in a sterile fashion, and a sterile drape was applied covering the operative field. A sterile gown and sterile gloves were used for the procedure. Local anesthesia was provided with 1% Lidocaine.  Previous imaging reviewed. Preliminary ultrasound performed of the right neck. Abnormal adenopathy localized. Under sterile conditions and local anesthesia, an 18 gauge core biopsy needle was advanced into the right neck adenopathy. Six core biopsies were obtained with an 18 gauge core device. Samples placed in saline. Postprocedure imaging demonstrates no hemorrhage or hematoma. Patient tolerated the biopsy well. Postprocedure imaging demonstrates no hemorrhage or hematoma.  COMPLICATIONS: None.  FINDINGS: Imaging confirms needle placed in the right cervical adenopathy  IMPRESSION: Successful ultrasound right cervical adenopathy 18 gauge core biopsies   Electronically Signed   By: Ruel Favors M.D.   On: 04/19/2013 14:35   Ir Fluoro Guide Cv Line Right  04/27/2013   CLINICAL DATA:  Lung carcinoma, needs venous access for chemotherapy  EXAM: TUNNELED PORT CATHETER PLACEMENT WITH ULTRASOUND AND FLUOROSCOPIC GUIDANCE  TECHNIQUE: The procedure, risks, benefits, and alternatives were explained to the patient. Questions regarding the  procedure were encouraged and answered. The patient understands and consents to the procedure. As antibiotic prophylaxis, cefazolin 2 g was ordered pre-procedure and administered intravenously  within one hour of incision. Patency of the right IJ vein was confirmed with ultrasound with image documentation. An appropriate skin site was determined. Skin site was marked. Region was prepped using maximum barrier technique including cap and mask, sterile gown, sterile gloves, large sterile sheet, and Chlorhexidine as cutaneous antisepsis. The region was infiltrated locally with 1% lidocaine. Under real-time ultrasound guidance, the right IJ vein was accessed with a 21 gauge micropuncture needle; the needle tip within the vein was confirmed with ultrasound image documentation. Needle was exchanged over a 018 guidewire for transitional dilator which allowed passage of the [facility] wire into the IVC. Over this, the transitional dilator was exchanged for a 5 [place] MPA catheter. A small incision was made on the right anterior chest wall and a subcutaneous pocket fashioned. The power-injectable port was positioned and its catheter tunneled to the right IJ dermatotomy site. The MPA catheter was exchanged over an Amplatz wire for a peel-away sheath, through which the port catheter, which had been trimmed to the appropriate length, was advanced and positioned under fluoroscopy with its tip at the cavoatrial junction. Spot chest radiograph confirms good catheter position and no pneumothorax. The pocket was closed with deep interrupted and subcuticular continuous 3-0 Monocryl sutures. The port was flushed per protocol. The incisions were covered with Dermabond then covered with a sterile dressing. No immediate complication.  ANESTHESIA/SEDATION: Intravenous Fentanyl and Versed were administered as conscious sedation during continuous cardiorespiratory monitoring by the radiology RN, with a total moderate sedation time of less than 30  minutes.  FLUOROSCOPY TIME:  18 seconds  IMPRESSION: Technically successful right IJ power-injectable port catheter placement. Ready for routine use.   Electronically Signed   By: Oley Balm M.D.   On: 04/27/2013 15:00   Ir US Guide Vasc Access Right  04/27/2013   CLINICAL DATA:  Lung carcinoma, needs venous access for chemotherapy  EXAM: TUNNELED PORT CATHETER PLACEMENT WITH ULTRASOUND AND FLUOROSCOPIC GUIDANCE  TECHNIQUE: The procedure, risks, benefits, and alternatives were explained to the patient. Questions regarding the procedure were encouraged and answered. The patient understands and consents to the procedure. As antibiotic prophylaxis, cefazolin 2 g was ordered pre-procedure and administered intravenously within one hour of incision. Patency of the right IJ vein was confirmed with ultrasound with image documentation. An appropriate skin site was determined. Skin site was marked. Region was prepped using maximum barrier technique including cap and mask, sterile gown, sterile gloves, large sterile sheet, and Chlorhexidine as cutaneous antisepsis. The region was infiltrated locally with 1% lidocaine. Under real-time ultrasound guidance, the right IJ vein was accessed with a 21 gauge micropuncture needle; the needle tip within the vein was confirmed with ultrasound image documentation. Needle was exchanged over a 018 guidewire for transitional dilator which allowed passage of the [facility] wire into the IVC. Over this, the transitional dilator was exchanged for a 5 [place] MPA catheter. A small incision was made on the right anterior chest wall and a subcutaneous pocket fashioned. The power-injectable port was positioned and its catheter tunneled to the right IJ dermatotomy site. The MPA catheter was exchanged over an Amplatz wire for a peel-away sheath, through which the port catheter, which had been trimmed to the appropriate length, was advanced and positioned under fluoroscopy with its tip at the  cavoatrial junction. Spot chest radiograph confirms good catheter position and no pneumothorax. The pocket was closed with deep interrupted and subcuticular continuous 3-0 Monocryl sutures. The port was flushed per protocol. The incisions were covered with  Dermabond then covered with a sterile dressing. No immediate complication.  ANESTHESIA/SEDATION: Intravenous Fentanyl and Versed were administered as conscious sedation during continuous cardiorespiratory monitoring by the radiology RN, with a total moderate sedation time of less than 30 minutes.  FLUOROSCOPY TIME:  18 seconds  IMPRESSION: Technically successful right IJ power-injectable port catheter placement. Ready for routine use.   Electronically Signed   By: Oley Balm M.D.   On: 04/27/2013 15:00    ASSESSMENT:  #1. Extensive stage small cell lung cancer, excellent tolerance of initial chemotherapy. #2. 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 #3. Chronic obstructive pulmonary disease, still smoking. #4. Right axillary inflammation suggestive of topical fungus infection. #5. Anticipated leukopenia after chemotherapy which only lasts 1 after 2 days because Neulasta was administered.   PLAN:  #1. Mycolog ointment to the right and left axilla after washing with soap and water and dry completely. He was told to sprinkle with cornstarch as well after the application of the ointment. #2. Transfuse if hemoglobin is less than 9. #3. Followup on 05/30/2013 to begin cycle #2 of therapy. You'll continue on allopurinol 300 mg daily. The patient, his wife, and his daughter were present during this interview and examination and agree with this strategy.   All questions were answered. The patient knows to call the clinic with any problems, questions or concerns. We can certainly see the patient much sooner if necessary.   I spent 25 minutes counseling the patient face to face. The total time spent in the  appointment was 30 minutes.    Maurilio Lovely, MD 05/16/2013 3:32 PM

## 2013-05-23 ENCOUNTER — Encounter (HOSPITAL_BASED_OUTPATIENT_CLINIC_OR_DEPARTMENT_OTHER): Payer: Medicare Other

## 2013-05-23 DIAGNOSIS — R591 Generalized enlarged lymph nodes: Secondary | ICD-10-CM

## 2013-05-23 DIAGNOSIS — D649 Anemia, unspecified: Secondary | ICD-10-CM

## 2013-05-23 DIAGNOSIS — Z452 Encounter for adjustment and management of vascular access device: Secondary | ICD-10-CM

## 2013-05-23 DIAGNOSIS — C3490 Malignant neoplasm of unspecified part of unspecified bronchus or lung: Secondary | ICD-10-CM

## 2013-05-23 DIAGNOSIS — R918 Other nonspecific abnormal finding of lung field: Secondary | ICD-10-CM

## 2013-05-23 LAB — CBC WITH DIFFERENTIAL/PLATELET
Basophils Absolute: 0 10*3/uL (ref 0.0–0.1)
Basophils Relative: 0 % (ref 0–1)
Eosinophils Relative: 1 % (ref 0–5)
HCT: 25.9 % — ABNORMAL LOW (ref 39.0–52.0)
Lymphocytes Relative: 13 % (ref 12–46)
Lymphs Abs: 1.2 10*3/uL (ref 0.7–4.0)
Neutro Abs: 7 10*3/uL (ref 1.7–7.7)
Platelets: 198 10*3/uL (ref 150–400)
RBC: 3.24 MIL/uL — ABNORMAL LOW (ref 4.22–5.81)
RDW: 16.3 % — ABNORMAL HIGH (ref 11.5–15.5)
WBC: 8.8 10*3/uL (ref 4.0–10.5)

## 2013-05-23 MED ORDER — SODIUM CHLORIDE 0.9 % IJ SOLN
10.0000 mL | INTRAMUSCULAR | Status: DC | PRN
  Administered 2013-05-23: 10 mL via INTRAVENOUS

## 2013-05-23 MED ORDER — HEPARIN SOD (PORK) LOCK FLUSH 100 UNIT/ML IV SOLN
500.0000 [IU] | Freq: Once | INTRAVENOUS | Status: AC
  Administered 2013-05-23: 500 [IU] via INTRAVENOUS

## 2013-05-23 MED ORDER — HEPARIN SOD (PORK) LOCK FLUSH 100 UNIT/ML IV SOLN
INTRAVENOUS | Status: AC
  Filled 2013-05-23: qty 5

## 2013-05-23 NOTE — Progress Notes (Signed)
Jonathan Watson presented for Portacath access and flush. Proper placement of portacath confirmed by CXR. Portacath located right chest wall accessed with  H 20 needle. Good blood return present. Portacath flushed with 20ml NS and 500U/74ml Heparin and needle removed intact. Procedure without incident. Patient tolerated procedure well. And labs drew from port.

## 2013-05-30 ENCOUNTER — Encounter (HOSPITAL_BASED_OUTPATIENT_CLINIC_OR_DEPARTMENT_OTHER): Payer: Medicare Other

## 2013-05-30 ENCOUNTER — Encounter (HOSPITAL_COMMUNITY): Payer: Self-pay

## 2013-05-30 ENCOUNTER — Ambulatory Visit (HOSPITAL_COMMUNITY): Payer: Medicare Other

## 2013-05-30 VITALS — BP 126/69 | HR 81 | Temp 97.7°F | Resp 18 | Wt 161.2 lb

## 2013-05-30 DIAGNOSIS — D62 Acute posthemorrhagic anemia: Secondary | ICD-10-CM

## 2013-05-30 DIAGNOSIS — Z5111 Encounter for antineoplastic chemotherapy: Secondary | ICD-10-CM

## 2013-05-30 DIAGNOSIS — D500 Iron deficiency anemia secondary to blood loss (chronic): Secondary | ICD-10-CM

## 2013-05-30 DIAGNOSIS — C3490 Malignant neoplasm of unspecified part of unspecified bronchus or lung: Secondary | ICD-10-CM

## 2013-05-30 DIAGNOSIS — D649 Anemia, unspecified: Secondary | ICD-10-CM

## 2013-05-30 DIAGNOSIS — C3492 Malignant neoplasm of unspecified part of left bronchus or lung: Secondary | ICD-10-CM

## 2013-05-30 DIAGNOSIS — R591 Generalized enlarged lymph nodes: Secondary | ICD-10-CM

## 2013-05-30 DIAGNOSIS — R918 Other nonspecific abnormal finding of lung field: Secondary | ICD-10-CM

## 2013-05-30 DIAGNOSIS — R252 Cramp and spasm: Secondary | ICD-10-CM

## 2013-05-30 DIAGNOSIS — J449 Chronic obstructive pulmonary disease, unspecified: Secondary | ICD-10-CM

## 2013-05-30 LAB — COMPREHENSIVE METABOLIC PANEL
AST: 15 U/L (ref 0–37)
Alkaline Phosphatase: 99 U/L (ref 39–117)
BUN: 13 mg/dL (ref 6–23)
CO2: 23 mEq/L (ref 19–32)
Chloride: 102 mEq/L (ref 96–112)
Creatinine, Ser: 1.04 mg/dL (ref 0.50–1.35)
GFR calc non Af Amer: 69 mL/min — ABNORMAL LOW (ref >90)
Glucose, Bld: 127 mg/dL — ABNORMAL HIGH (ref 70–99)
Potassium: 3.7 mEq/L (ref 3.5–5.1)
Sodium: 137 mEq/L (ref 135–145)
Total Bilirubin: 0.2 mg/dL — ABNORMAL LOW (ref 0.3–1.2)

## 2013-05-30 LAB — CBC WITH DIFFERENTIAL/PLATELET
Basophils Absolute: 0.1 10*3/uL (ref 0.0–0.1)
Basophils Relative: 1 % (ref 0–1)
HCT: 23.8 % — ABNORMAL LOW (ref 39.0–52.0)
Hemoglobin: 7.5 g/dL — ABNORMAL LOW (ref 13.0–17.0)
Lymphocytes Relative: 12 % (ref 12–46)
Lymphs Abs: 0.9 10*3/uL (ref 0.7–4.0)
MCHC: 31.5 g/dL (ref 30.0–36.0)
Monocytes Absolute: 0.6 10*3/uL (ref 0.1–1.0)
Monocytes Relative: 8 % (ref 3–12)
Neutro Abs: 5.6 10*3/uL (ref 1.7–7.7)
Neutrophils Relative %: 79 % — ABNORMAL HIGH (ref 43–77)
RBC: 2.97 MIL/uL — ABNORMAL LOW (ref 4.22–5.81)
RDW: 17 % — ABNORMAL HIGH (ref 11.5–15.5)
WBC: 7.1 10*3/uL (ref 4.0–10.5)

## 2013-05-30 MED ORDER — SODIUM CHLORIDE 0.9 % IJ SOLN
10.0000 mL | INTRAMUSCULAR | Status: DC | PRN
  Administered 2013-05-30: 10 mL

## 2013-05-30 MED ORDER — SODIUM CHLORIDE 0.9 % IV SOLN: 16.0000 mg | Freq: Once | INTRAVENOUS | Status: DC

## 2013-05-30 MED ORDER — SODIUM CHLORIDE 0.9 % IV SOLN
Freq: Once | INTRAVENOUS | Status: AC
  Administered 2013-05-30: 10:00:00 via INTRAVENOUS

## 2013-05-30 MED ORDER — SODIUM CHLORIDE 0.9 % IV SOLN
Freq: Once | INTRAVENOUS | Status: AC
  Administered 2013-05-30: 16 mg via INTRAVENOUS
  Filled 2013-05-30: qty 8

## 2013-05-30 MED ORDER — SODIUM CHLORIDE 0.9 % IV SOLN
437.0000 mg | Freq: Once | INTRAVENOUS | Status: AC
  Administered 2013-05-30: 440 mg via INTRAVENOUS
  Filled 2013-05-30: qty 44

## 2013-05-30 MED ORDER — DEXAMETHASONE SODIUM PHOSPHATE 10 MG/ML IJ SOLN: 20.0000 mg | Freq: Once | INTRAMUSCULAR | Status: DC

## 2013-05-30 MED ORDER — SODIUM CHLORIDE 0.9 % IV SOLN
100.0000 mg/m2 | Freq: Once | INTRAVENOUS | Status: AC
  Administered 2013-05-30: 190 mg via INTRAVENOUS
  Filled 2013-05-30: qty 9.5

## 2013-05-30 MED ORDER — HEPARIN SOD (PORK) LOCK FLUSH 100 UNIT/ML IV SOLN
500.0000 [IU] | Freq: Once | INTRAVENOUS | Status: AC | PRN
  Administered 2013-05-30: 500 [IU]

## 2013-05-30 MED ORDER — HEPARIN SOD (PORK) LOCK FLUSH 100 UNIT/ML IV SOLN
INTRAVENOUS | Status: AC
  Filled 2013-05-30: qty 5

## 2013-05-30 NOTE — Progress Notes (Signed)
[facility]  OFFICE PROGRESS NOTE  Colon Branch, MD [address] [place] [address]  DIAGNOSIS: Small cell lung cancer, left - Plan: Magnesium, 0.9 %  sodium chloride infusion, sodium chloride 0.9 % injection 10 mL, heparin lock flush 100 unit/mL, heparin lock flush 100 unit/mL, sodium chloride 0.9 % injection 3 mL, CANCELED: Prepare RBC  Acute blood loss anemia - Plan: 0.9 %  sodium chloride infusion, sodium chloride 0.9 % injection 10 mL, heparin lock flush 100 unit/mL, heparin lock flush 100 unit/mL, sodium chloride 0.9 % injection 3 mL, CANCELED: Prepare RBC  Chief Complaint  Patient presents with  . Lung Cancer    Extensive stage small cell lung cancer  . 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 male returns for continuation of chemotherapy for extensive stage small cell lung cancer together with chronic blood loss anemia requiring transfusions. He is scheduled to receive cycle #2 of chemotherapy today and did receive 2 units of packed red blood cells 3 weeks ago.  He is getting cramping during the night which is relieved by my at all plus vinegar. Is also had some burning with urination for the past 2 weeks but without frequency or nocturia. He denies any melena, hematochezia, hematuria, hemoptysis, or epistaxis. He denies any PND, orthopnea, palpitations, worsening cough, sore throat, fever, but with 1 episode of chills. He also denies easy satiety, abdominal distention, lower extremity swelling or redness, joint pain, headache, or seizures.  MEDICAL HISTORY: Past Medical History  Diagnosis Date  . MI (myocardial infarction)   . Hypertension   . Hyperlipidemia   . Coronary artery disease   . Dementia 01/24/2013  . GERD (gastroesophageal reflux disease)   . Blood transfusion without reported diagnosis     INTERIM HISTORY:  has Acute blood loss anemia; Upper GI bleed; CAD (coronary artery disease); GERD (gastroesophageal reflux disease); Hyperlipidemia; Dark stools; Dizziness; GI bleed; Dementia; Essential hypertension, benign; Other and unspecified hyperlipidemia; Small bowel mass; Helicobacter pylori gastritis (chronic gastritis); Angina pectoris; Lung mass; Bowel wall thickening; Anemia due to blood loss, chronic; Arteriovenous malformation of gastrointestinal tract; Lymphadenopathy, generalized; and Small cell lung cancer on his problem list.   Patient underwent cervical lymph node fine needle aspiration on 04/19/2013 at which time small cell carcinoma was diagnosed. This finding in conjunction with a large lung mass and diffuse lymphadenopathy involving the abdomen and mediastinum are wall consistent with extensive stage small cell lung cancer. 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  ALLERGIES:  has No Known Allergies.  MEDICATIONS: has a current medication list which includes the following prescription(s): acetaminophen, allopurinol, lidocaine-prilocaine, metoclopramide, nystatin-triamcinolone ointment, omeprazole, prochlorperazine, and dexamethasone, and the following Facility-Administered Medications: sodium chloride, CARBOplatin (PARAPLATIN) 440 mg in sodium chloride 0.9 % 250 mL chemo infusion, etoposide (VEPESID) 190 mg in sodium chloride 0.9 % 500 mL chemo infusion, heparin lock flush, ondansetron (ZOFRAN) 16 mg, dexamethasone (DECADRON) 20 mg in sodium chloride 0.9 % 50 mL IVPB, and sodium chloride.  SURGICAL HISTORY:  Past Surgical History  Procedure Laterality Date  . Coronary artery bypass graft    . Cervical spine surgery    . Esophagogastroduodenoscopy N/A 01/15/2013    Dr. 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 Colonoscopy N/A 01/17/2013     Dr. Darrick Penna: normal TI, normal colon, small internal hemorrhoids  . Appendectomy    . Lymph node biopsy Right 04/2013    cervical    FAMILY HISTORY: family history includes Hypertension in his father and mother. There is no history of Colon cancer.  SOCIAL HISTORY:  reports that he quit smoking about 14 years ago. His smoking use included Cigarettes. He has a 37.5 pack-year smoking history. His smokeless tobacco use includes Snuff. He reports that he does not drink alcohol or use illicit drugs.  REVIEW OF SYSTEMS:  Other than that discussed above is noncontributory.  PHYSICAL EXAMINATION: ECOG PERFORMANCE STATUS: 1 - Symptomatic but completely ambulatory  Blood pressure 126/69, pulse 81, temperature 97.7 F (36.5 C), temperature source Oral, resp. rate 18, weight 161 lb 3.2 oz (73.12 kg).  GENERAL:alert, no distress and comfortable. Pale. SKIN: skin color, texture, turgor are normal, no rashes or significant lesions EYES: PERLA; Conjunctiva are pink and non-injected, sclera clear OROPHARYNX:no exudate, no erythema on lips, buccal mucosa, or tongue. NECK: supple, thyroid normal size, non-tender, without nodularity. No masses CHEST: Increased AP diameter with no gynecomastia. LYMPH:  no palpable lymphadenopathy in the cervical, axillary or inguinal LUNGS: clear to auscultation and percussion with normal breathing effort HEART: regular rate & rhythm and no murmurs. ABDOMEN:abdomen soft, non-tender and normal bowel sounds MUSCULOSKELETAL:no cyanosis of digits and no clubbing. Range of motion normal.  NEURO: alert & oriented x 3 with fluent speech, no focal motor/sensory deficits. S2   LABORATORY DATA: Infusion on 05/30/2013  Component Date Value Range Status  . WBC 05/30/2013 7.1  4.0 - 10.5 K/uL Final  . RBC 05/30/2013 2.97* 4.22 - 5.81 MIL/uL Final  . Hemoglobin 05/30/2013 7.5* 13.0 - 17.0 g/dL Final  . HCT 14/78/2956 23.8* 39.0 - 52.0 % Final  . MCV 05/30/2013 80.1  78.0 - 100.0  fL Final  . MCH 05/30/2013 25.3* 26.0 - 34.0 pg Final  . MCHC 05/30/2013 31.5  30.0 - 36.0 g/dL Final  . RDW 21/30/8657 17.0* 11.5 - 15.5 % Final  . Platelets 05/30/2013 445* 150 - 400 K/uL Final  . Neutrophils Relative % 05/30/2013 79* 43 - 77 % Final  . Neutro Abs 05/30/2013 5.6  1.7 - 7.7 K/uL Final  . Lymphocytes Relative 05/30/2013 12  12 - 46 % Final  . Lymphs Abs 05/30/2013 0.9  0.7 - 4.0 K/uL Final  . Monocytes Relative 05/30/2013 8  3 - 12 % Final  . Monocytes Absolute 05/30/2013 0.6  0.1 - 1.0 K/uL Final  . Eosinophils Relative 05/30/2013 0  0 - 5 % Final  . Eosinophils Absolute 05/30/2013 0.0  0.0 - 0.7 K/uL Final  . Basophils Relative 05/30/2013 1  0 - 1 % Final  . Basophils Absolute 05/30/2013 0.1  0.0 - 0.1 K/uL Final  . Sodium 05/30/2013 137  135 - 145 mEq/L Final  . Potassium 05/30/2013 3.7  3.5 - 5.1 mEq/L Final  . Chloride 05/30/2013 102  96 - 112 mEq/L Final  . CO2 05/30/2013 23  19 - 32 mEq/L Final  . Glucose, Bld 05/30/2013 127* 70 - 99 mg/dL Final  . BUN 84/69/6295 13  6 - 23 mg/dL Final  . Creatinine, Ser 05/30/2013 1.04  0.50 - 1.35 mg/dL Final  . Calcium 28/41/3244 9.1  8.4 - 10.5 mg/dL Final  . Total Protein 05/30/2013 6.7  6.0 - 8.3 g/dL Final  . Albumin 06/04/7251  2.5* 3.5 - 5.2 g/dL Final  . AST 16/03/9603 15  0 - 37 U/L Final  . ALT 05/30/2013 12  0 - 53 U/L Final  . Alkaline Phosphatase 05/30/2013 99  39 - 117 U/L Final  . Total Bilirubin 05/30/2013 0.2* 0.3 - 1.2 mg/dL Final  . GFR calc non Af Amer 05/30/2013 69* >90 mL/min Final  . GFR calc Af Amer 05/30/2013 80* >90 mL/min Final   Comment: (NOTE)                          The eGFR has been calculated using the CKD EPI equation.                          This calculation has not been validated in all clinical situations.                          eGFR's persistently <90 mL/min signify possible Chronic Kidney                          Disease.  . Magnesium 05/30/2013 2.0  1.5 - 2.5 mg/dL Final    Infusion on 54/01/8118  Component Date Value Range Status  . WBC 05/23/2013 8.8  4.0 - 10.5 K/uL Final  . RBC 05/23/2013 3.24* 4.22 - 5.81 MIL/uL Final  . Hemoglobin 05/23/2013 8.3* 13.0 - 17.0 g/dL Final  . HCT 14/78/2956 25.9* 39.0 - 52.0 % Final  . MCV 05/23/2013 79.9  78.0 - 100.0 fL Final  . MCH 05/23/2013 25.6* 26.0 - 34.0 pg Final  . MCHC 05/23/2013 32.0  30.0 - 36.0 g/dL Final  . RDW 21/30/8657 16.3* 11.5 - 15.5 % Final  . Platelets 05/23/2013 198  150 - 400 K/uL Final  . Neutrophils Relative % 05/23/2013 80* 43 - 77 % Final  . Neutro Abs 05/23/2013 7.0  1.7 - 7.7 K/uL Final  . Lymphocytes Relative 05/23/2013 13  12 - 46 % Final  . Lymphs Abs 05/23/2013 1.2  0.7 - 4.0 K/uL Final  . Monocytes Relative 05/23/2013 7  3 - 12 % Final  . Monocytes Absolute 05/23/2013 0.6  0.1 - 1.0 K/uL Final  . Eosinophils Relative 05/23/2013 1  0 - 5 % Final  . Eosinophils Absolute 05/23/2013 0.1  0.0 - 0.7 K/uL Final  . Basophils Relative 05/23/2013 0  0 - 1 % Final  . Basophils Absolute 05/23/2013 0.0  0.0 - 0.1 K/uL Final  Infusion on 05/16/2013  Component Date Value Range Status  . WBC 05/16/2013 2.2* 4.0 - 10.5 K/uL Final  . RBC 05/16/2013 3.69* 4.22 - 5.81 MIL/uL Final  . Hemoglobin 05/16/2013 9.5* 13.0 - 17.0 g/dL Final  . HCT 84/69/6295 29.6* 39.0 - 52.0 % Final  . MCV 05/16/2013 80.2  78.0 - 100.0 fL Final  . MCH 05/16/2013 25.7* 26.0 - 34.0 pg Final  . MCHC 05/16/2013 32.1  30.0 - 36.0 g/dL Final  . RDW 28/41/3244 15.7* 11.5 - 15.5 % Final  . Platelets 05/16/2013 287  150 - 400 K/uL Final  . Neutrophils Relative % 05/16/2013 69  43 - 77 % Final  . Neutro Abs 05/16/2013 1.6* 1.7 - 7.7 K/uL Final  . Lymphocytes Relative 05/16/2013 23  12 - 46 % Final  . Lymphs Abs 05/16/2013 0.5* 0.7 - 4.0 K/uL Final  . Monocytes Relative 05/16/2013 4  3 - 12 % Final  . Monocytes Absolute 05/16/2013 0.1  0.1 - 1.0 K/uL Final  . Eosinophils Relative 05/16/2013 4  0 - 5 % Final  . Eosinophils  Absolute 05/16/2013 0.1  0.0 - 0.7 K/uL Final  . Basophils Relative 05/16/2013 0  0 - 1 % Final  . Basophils Absolute 05/16/2013 0.0  0.0 - 0.1 K/uL Final  Infusion on 05/09/2013  Component Date Value Range Status  . WBC 05/09/2013 7.6  4.0 - 10.5 K/uL Final  . RBC 05/09/2013 2.63* 4.22 - 5.81 MIL/uL Final  . Hemoglobin 05/09/2013 7.0* 13.0 - 17.0 g/dL Final  . HCT 16/03/9603 21.2* 39.0 - 52.0 % Final  . MCV 05/09/2013 80.6  78.0 - 100.0 fL Final  . MCH 05/09/2013 25.9* 26.0 - 34.0 pg Final  . MCHC 05/09/2013 32.1  30.0 - 36.0 g/dL Final  . RDW 54/01/8118 15.8* 11.5 - 15.5 % Final  . Platelets 05/09/2013 381  150 - 400 K/uL Final  . Neutrophils Relative % 05/09/2013 73  43 - 77 % Final  . Neutro Abs 05/09/2013 5.5  1.7 - 7.7 K/uL Final  . Lymphocytes Relative 05/09/2013 15  12 - 46 % Final  . Lymphs Abs 05/09/2013 1.1  0.7 - 4.0 K/uL Final  . Monocytes Relative 05/09/2013 8  3 - 12 % Final  . Monocytes Absolute 05/09/2013 0.6  0.1 - 1.0 K/uL Final  . Eosinophils Relative 05/09/2013 3  0 - 5 % Final  . Eosinophils Absolute 05/09/2013 0.3  0.0 - 0.7 K/uL Final  . Basophils Relative 05/09/2013 1  0 - 1 % Final  . Basophils Absolute 05/09/2013 0.1  0.0 - 0.1 K/uL Final  . Sodium 05/09/2013 133* 135 - 145 mEq/L Final  . Potassium 05/09/2013 4.0  3.5 - 5.1 mEq/L Final  . Chloride 05/09/2013 98  96 - 112 mEq/L Final  . CO2 05/09/2013 22  19 - 32 mEq/L Final  . Glucose, Bld 05/09/2013 96  70 - 99 mg/dL Final  . BUN 14/78/2956 20  6 - 23 mg/dL Final  . Creatinine, Ser 05/09/2013 1.13  0.50 - 1.35 mg/dL Final  . Calcium 21/30/8657 8.9  8.4 - 10.5 mg/dL Final  . Total Protein 05/09/2013 6.5  6.0 - 8.3 g/dL Final  . Albumin 84/69/6295 2.4* 3.5 - 5.2 g/dL Final  . AST 28/41/3244 29  0 - 37 U/L Final  . ALT 05/09/2013 14  0 - 53 U/L Final  . Alkaline Phosphatase 05/09/2013 66  39 - 117 U/L Final  . Total Bilirubin 05/09/2013 0.2* 0.3 - 1.2 mg/dL Final  . GFR calc non Af Amer 05/09/2013 63*  >90 mL/min Final  . GFR calc Af Amer 05/09/2013 73* >90 mL/min Final   Comment: (NOTE)                          The eGFR has been calculated using the CKD EPI equation.                          This calculation has not been validated in all clinical situations.                          eGFR's persistently <90 mL/min signify possible Chronic Kidney  Disease.  . ABO/RH(D) 05/09/2013 O POS   Final  . Antibody Screen 05/09/2013 NEG   Final  . Sample Expiration 05/09/2013 05/12/2013   Final  . Unit Number 05/09/2013 [id number]   Final  . Blood Component Type 05/09/2013 RED CELLS,LR   Final  . Unit division 05/09/2013 00   Final  . Status of Unit 05/09/2013 ISSUED,FINAL   Final  . Transfusion Status 05/09/2013 OK TO TRANSFUSE   Final  . Crossmatch Result 05/09/2013 Compatible   Final  . Unit Number 05/09/2013 [id number]   Final  . Blood Component Type 05/09/2013 RED CELLS,LR   Final  . Unit division 05/09/2013 00   Final  . Status of Unit 05/09/2013 ISSUED,FINAL   Final  . Transfusion Status 05/09/2013 OK TO TRANSFUSE   Final  . Crossmatch Result 05/09/2013 Compatible   Final  . Order Confirmation 05/09/2013 ORDER PROCESSED BY BLOOD BANK   Final  Infusion on 05/02/2013  Component Date Value Range Status  . WBC 05/02/2013 8.4  4.0 - 10.5 K/uL Final  . RBC 05/02/2013 2.68* 4.22 - 5.81 MIL/uL Final  . Hemoglobin 05/02/2013 7.0* 13.0 - 17.0 g/dL Final  . HCT 30/86/5784 22.5* 39.0 - 52.0 % Final  . MCV 05/02/2013 84.0  78.0 - 100.0 fL Final  . MCH 05/02/2013 26.1  26.0 - 34.0 pg Final  . MCHC 05/02/2013 31.1  30.0 - 36.0 g/dL Final  . RDW 69/62/9528 16.8* 11.5 - 15.5 % Final  . Platelets 05/02/2013 381  150 - 400 K/uL Final  . Neutrophils Relative % 05/02/2013 78* 43 - 77 % Final  . Neutro Abs 05/02/2013 6.6  1.7 - 7.7 K/uL Final  . Lymphocytes Relative 05/02/2013 10* 12 - 46 % Final  . Lymphs Abs 05/02/2013 0.9  0.7 - 4.0 K/uL Final  . Monocytes Relative  05/02/2013 8  3 - 12 % Final  . Monocytes Absolute 05/02/2013 0.7  0.1 - 1.0 K/uL Final  . Eosinophils Relative 05/02/2013 3  0 - 5 % Final  . Eosinophils Absolute 05/02/2013 0.2  0.0 - 0.7 K/uL Final  . Basophils Relative 05/02/2013 1  0 - 1 % Final  . Basophils Absolute 05/02/2013 0.1  0.0 - 0.1 K/uL Final  . Order Confirmation 05/02/2013 ORDER PROCESSED BY BLOOD BANK   Final  . ABO/RH(D) 05/02/2013 O POS   Final  . Antibody Screen 05/02/2013 NEG   Final  . Sample Expiration 05/02/2013 05/05/2013   Final  . Unit Number 05/02/2013 [id number]   Final  . Blood Component Type 05/02/2013 RED CELLS,LR   Final  . Unit division 05/02/2013 00   Final  . Status of Unit 05/02/2013 ISSUED,FINAL   Final  . Transfusion Status 05/02/2013 OK TO TRANSFUSE   Final  . Crossmatch Result 05/02/2013 Compatible   Final  . Unit Number 05/02/2013 [id number]   Final  . Blood Component Type 05/02/2013 RED CELLS,LR   Final  . Unit division 05/02/2013 00   Final  . Status of Unit 05/02/2013 ISSUED,FINAL   Final  . Transfusion Status 05/02/2013 OK TO TRANSFUSE   Final  . Crossmatch Result 05/02/2013 Compatible   Final    PATHOLOGY: No new pathology.  Urinalysis    Component Value Date/Time   COLORURINE YELLOW 01/14/2013 1938   APPEARANCEUR CLEAR 01/14/2013 1938   LABSPEC 1.015 01/14/2013 1938   PHURINE 5.5 01/14/2013 1938   GLUCOSEU NEGATIVE 01/14/2013 1938   HGBUR NEGATIVE 01/14/2013 1938   BILIRUBINUR NEGATIVE 01/14/2013 1938  KETONESUR NEGATIVE 01/14/2013 1938   PROTEINUR NEGATIVE 01/14/2013 1938   UROBILINOGEN 0.2 01/14/2013 1938   NITRITE NEGATIVE 01/14/2013 1938   LEUKOCYTESUR NEGATIVE 01/14/2013 1938    RADIOGRAPHIC STUDIES: No results found.  ASSESSMENT:  #1. Extensive stage small cell lung cancer, for cycle 2 of chemotherapy today. #2. Nocturnal cramping possibly due to electrolyte abnormalities, for evaluation today., Possible small vessel disease. #3. Chronic obstructive pulmonary  disease #4. 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 #5. Axillary inflammation secondary to topical fungus infection, improved with Mycolog ointment.   PLAN:  #1. If abnormal, correct potassium and magnesium with supplements. #2. Cycle 2 of VP-16/carboplatin today followed by Neulasta 6 mg subcutaneously on day 4. #3. Transfuse if hemoglobin is less than 9. #4. Followup in one week with CBC. #5. Quinine water 8 ounces daily.   All questions were answered. The patient knows to call the clinic with any problems, questions or concerns. We can certainly see the patient much sooner if necessary.   I spent 25 minutes counseling the patient face to face. The total time spent in the appointment was 30 minutes.    Maurilio Lovely, MD 05/30/2013 10:19 AM

## 2013-05-30 NOTE — Patient Instructions (Addendum)
[facility] Discharge Instructions  RECOMMENDATIONS MADE BY THE CONSULTANT AND ANY TEST RESULTS WILL BE SENT TO YOUR REFERRING PHYSICIAN.  EXAM FINDINGS BY THE PHYSICIAN TODAY AND SIGNS OR SYMPTOMS TO REPORT TO CLINIC OR PRIMARY PHYSICIAN: Exam and findings as discussed by Dr. Zigmund Daniel.  Will treat today if your blood counts are ok. Will check labs today to see if there's anything causing your cramps.  Report fevers, chills, uncontrolled nausea or vomiting.  MEDICATIONS PRESCRIBED:  none  INSTRUCTIONS/FOLLOW-UP: Follow-up in next week with MD.  Thank you for choosing [facility] to provide your oncology and hematology care.  To afford each patient quality time with our providers, please arrive at least 15 minutes before your scheduled appointment time.  With your help, our goal is to use those 15 minutes to complete the necessary work-up to ensure our physicians have the information they need to help with your evaluation and healthcare recommendations.    Effective January 1st, 2014, we ask that you re-schedule your appointment with our physicians should you arrive 10 or more minutes late for your appointment.  We strive to give you quality time with our providers, and arriving late affects you and other patients whose appointments are after yours.    Again, thank you for choosing [facility].  Our hope is that these requests will decrease the amount of time that you wait before being seen by our physicians.       _____________________________________________________________  Should you have questions after your visit to [facility], please contact our office at [phone] between the hours of 8:30 a.m. and 5:00 p.m.  Voicemails left after 4:30 p.m. will not be returned until the following business day.  For prescription refill requests, have your pharmacy contact our office with your prescription refill request.

## 2013-05-30 NOTE — Progress Notes (Signed)
Tolerated chemo well today.  Port-a-cath taped securely to chest for Day 2 tomorrow.  Home accompanied by family members.

## 2013-05-31 ENCOUNTER — Encounter (HOSPITAL_BASED_OUTPATIENT_CLINIC_OR_DEPARTMENT_OTHER): Payer: Medicare Other

## 2013-05-31 ENCOUNTER — Ambulatory Visit (HOSPITAL_COMMUNITY): Payer: Medicare Other

## 2013-05-31 VITALS — BP 119/60 | HR 64 | Temp 97.8°F | Resp 18 | Wt 162.0 lb

## 2013-05-31 DIAGNOSIS — C7951 Secondary malignant neoplasm of bone: Secondary | ICD-10-CM

## 2013-05-31 DIAGNOSIS — C3492 Malignant neoplasm of unspecified part of left bronchus or lung: Secondary | ICD-10-CM

## 2013-05-31 DIAGNOSIS — C3490 Malignant neoplasm of unspecified part of unspecified bronchus or lung: Secondary | ICD-10-CM

## 2013-05-31 DIAGNOSIS — D500 Iron deficiency anemia secondary to blood loss (chronic): Secondary | ICD-10-CM

## 2013-05-31 DIAGNOSIS — R591 Generalized enlarged lymph nodes: Secondary | ICD-10-CM

## 2013-05-31 DIAGNOSIS — Z5111 Encounter for antineoplastic chemotherapy: Secondary | ICD-10-CM

## 2013-05-31 MED ORDER — SODIUM CHLORIDE 0.9 % IV SOLN
Freq: Once | INTRAVENOUS | Status: AC
  Administered 2013-05-31: 09:00:00 via INTRAVENOUS

## 2013-05-31 MED ORDER — HEPARIN SOD (PORK) LOCK FLUSH 100 UNIT/ML IV SOLN
500.0000 [IU] | Freq: Once | INTRAVENOUS | Status: AC | PRN
  Administered 2013-05-31: 500 [IU]
  Filled 2013-05-31: qty 5

## 2013-05-31 MED ORDER — PROCHLORPERAZINE MALEATE 10 MG PO TABS
10.0000 mg | ORAL_TABLET | Freq: Once | ORAL | Status: AC
  Administered 2013-05-31: 10 mg via ORAL
  Filled 2013-05-31: qty 1

## 2013-05-31 MED ORDER — SODIUM CHLORIDE 0.9 % IJ SOLN: 10.0000 mL | INTRAMUSCULAR | Status: DC | PRN

## 2013-05-31 MED ORDER — SODIUM CHLORIDE 0.9 % IV SOLN
100.0000 mg/m2 | Freq: Once | INTRAVENOUS | Status: AC
  Administered 2013-05-31: 190 mg via INTRAVENOUS
  Filled 2013-05-31: qty 9.5

## 2013-05-31 NOTE — Progress Notes (Signed)
Tolerated chemo rx and blood transfusion well. Alert, answers questions appropriately; in no apparent distress. Left in c/o son for transport home.

## 2013-06-01 ENCOUNTER — Encounter (HOSPITAL_BASED_OUTPATIENT_CLINIC_OR_DEPARTMENT_OTHER): Payer: Medicare Other

## 2013-06-01 VITALS — BP 148/60 | HR 60 | Temp 97.7°F | Resp 18 | Wt 165.4 lb

## 2013-06-01 DIAGNOSIS — Z5111 Encounter for antineoplastic chemotherapy: Secondary | ICD-10-CM

## 2013-06-01 DIAGNOSIS — C3492 Malignant neoplasm of unspecified part of left bronchus or lung: Secondary | ICD-10-CM

## 2013-06-01 DIAGNOSIS — R591 Generalized enlarged lymph nodes: Secondary | ICD-10-CM

## 2013-06-01 DIAGNOSIS — D62 Acute posthemorrhagic anemia: Secondary | ICD-10-CM

## 2013-06-01 DIAGNOSIS — C3490 Malignant neoplasm of unspecified part of unspecified bronchus or lung: Secondary | ICD-10-CM

## 2013-06-01 DIAGNOSIS — D500 Iron deficiency anemia secondary to blood loss (chronic): Secondary | ICD-10-CM

## 2013-06-01 MED ORDER — HEPARIN SOD (PORK) LOCK FLUSH 100 UNIT/ML IV SOLN
500.0000 [IU] | Freq: Once | INTRAVENOUS | Status: AC | PRN
  Administered 2013-06-01: 500 [IU]

## 2013-06-01 MED ORDER — SODIUM CHLORIDE 0.9 % IV SOLN
Freq: Once | INTRAVENOUS | Status: AC
  Administered 2013-06-01: 09:00:00 via INTRAVENOUS

## 2013-06-01 MED ORDER — PROCHLORPERAZINE MALEATE 10 MG PO TABS
10.0000 mg | ORAL_TABLET | Freq: Once | ORAL | Status: AC
  Administered 2013-06-01: 10 mg via ORAL
  Filled 2013-06-01: qty 1

## 2013-06-01 MED ORDER — SODIUM CHLORIDE 0.9 % IJ SOLN: 10.0000 mL | INTRAMUSCULAR | Status: DC | PRN

## 2013-06-01 MED ORDER — SODIUM CHLORIDE 0.9 % IV SOLN
100.0000 mg/m2 | Freq: Once | INTRAVENOUS | Status: AC
  Administered 2013-06-01: 190 mg via INTRAVENOUS
  Filled 2013-06-01: qty 9.5

## 2013-06-01 MED ORDER — SODIUM CHLORIDE 0.9 % IV SOLN
250.0000 mL | Freq: Once | INTRAVENOUS | Status: AC
  Administered 2013-06-01: 250 mL via INTRAVENOUS

## 2013-06-01 MED ORDER — HEPARIN SOD (PORK) LOCK FLUSH 100 UNIT/ML IV SOLN
INTRAVENOUS | Status: AC
  Filled 2013-06-01: qty 5

## 2013-06-01 MED ORDER — SODIUM CHLORIDE 0.9 % IJ SOLN
10.0000 mL | INTRAMUSCULAR | Status: AC | PRN
Start: 2013-06-01 — End: 2013-06-01
  Administered 2013-06-01: 10 mL

## 2013-06-02 ENCOUNTER — Encounter (HOSPITAL_COMMUNITY): Payer: Medicare Other | Attending: Gastroenterology

## 2013-06-02 DIAGNOSIS — D649 Anemia, unspecified: Secondary | ICD-10-CM | POA: Insufficient documentation

## 2013-06-02 DIAGNOSIS — C3492 Malignant neoplasm of unspecified part of left bronchus or lung: Secondary | ICD-10-CM

## 2013-06-02 DIAGNOSIS — R599 Enlarged lymph nodes, unspecified: Secondary | ICD-10-CM | POA: Insufficient documentation

## 2013-06-02 DIAGNOSIS — C3490 Malignant neoplasm of unspecified part of unspecified bronchus or lung: Secondary | ICD-10-CM | POA: Insufficient documentation

## 2013-06-02 DIAGNOSIS — R591 Generalized enlarged lymph nodes: Secondary | ICD-10-CM

## 2013-06-02 DIAGNOSIS — R222 Localized swelling, mass and lump, trunk: Secondary | ICD-10-CM | POA: Insufficient documentation

## 2013-06-02 DIAGNOSIS — Z5189 Encounter for other specified aftercare: Secondary | ICD-10-CM

## 2013-06-02 DIAGNOSIS — D500 Iron deficiency anemia secondary to blood loss (chronic): Secondary | ICD-10-CM | POA: Insufficient documentation

## 2013-06-02 LAB — TYPE AND SCREEN
ABO/RH(D): O POS
Antibody Screen: NEGATIVE
Unit division: 0
Unit division: 0

## 2013-06-02 MED ORDER — PEGFILGRASTIM INJECTION 6 MG/0.6ML
6.0000 mg | Freq: Once | SUBCUTANEOUS | Status: AC
  Administered 2013-06-02: 6 mg via SUBCUTANEOUS

## 2013-06-02 MED ORDER — PEGFILGRASTIM INJECTION 6 MG/0.6ML
SUBCUTANEOUS | Status: AC
  Filled 2013-06-02: qty 0.6

## 2013-06-02 NOTE — Progress Notes (Signed)
24 Hour Chemotherapy Follow up:  Patient in for neulasta injection following administration of Carboplatin and VP16 Patient reports no complaints and is eating and drinking well. Anti-nausea Medications reviewed and patient is taking as directed. Reviewed all post chemotherapy instructions with patient and when should call clinic or MD. Patient verbalized understanding.   Neulasta 6mg  administered SQ in right Abdomen. Administration without incident. Patient tolerated well.  Instructed regarding potential for bone pain and what to do if discomfort should occur.  Verbalized understanding of instructions.

## 2013-06-08 ENCOUNTER — Encounter (HOSPITAL_BASED_OUTPATIENT_CLINIC_OR_DEPARTMENT_OTHER): Payer: Medicare Other

## 2013-06-08 ENCOUNTER — Encounter (HOSPITAL_COMMUNITY): Payer: Self-pay

## 2013-06-08 VITALS — BP 156/64 | HR 67 | Temp 97.5°F | Resp 18 | Wt 159.6 lb

## 2013-06-08 DIAGNOSIS — G4700 Insomnia, unspecified: Secondary | ICD-10-CM

## 2013-06-08 DIAGNOSIS — D649 Anemia, unspecified: Secondary | ICD-10-CM | POA: Diagnosis present

## 2013-06-08 DIAGNOSIS — C3492 Malignant neoplasm of unspecified part of left bronchus or lung: Secondary | ICD-10-CM

## 2013-06-08 DIAGNOSIS — D500 Iron deficiency anemia secondary to blood loss (chronic): Secondary | ICD-10-CM | POA: Diagnosis present

## 2013-06-08 DIAGNOSIS — C3490 Malignant neoplasm of unspecified part of unspecified bronchus or lung: Secondary | ICD-10-CM

## 2013-06-08 DIAGNOSIS — R222 Localized swelling, mass and lump, trunk: Secondary | ICD-10-CM | POA: Diagnosis present

## 2013-06-08 DIAGNOSIS — R599 Enlarged lymph nodes, unspecified: Secondary | ICD-10-CM | POA: Diagnosis present

## 2013-06-08 LAB — CBC WITH DIFFERENTIAL/PLATELET
Basophils Absolute: 0 10*3/uL (ref 0.0–0.1)
Basophils Relative: 0 % (ref 0–1)
EOS ABS: 0 10*3/uL (ref 0.0–0.7)
Eosinophils Relative: 1 % (ref 0–5)
HCT: 33.7 % — ABNORMAL LOW (ref 39.0–52.0)
HEMOGLOBIN: 10.7 g/dL — AB (ref 13.0–17.0)
LYMPHS ABS: 0.8 10*3/uL (ref 0.7–4.0)
Lymphocytes Relative: 27 % (ref 12–46)
MCH: 25.7 pg — ABNORMAL LOW (ref 26.0–34.0)
MCHC: 31.8 g/dL (ref 30.0–36.0)
MCV: 81 fL (ref 78.0–100.0)
MONOS PCT: 10 % (ref 3–12)
Monocytes Absolute: 0.3 10*3/uL (ref 0.1–1.0)
NEUTROS ABS: 2 10*3/uL (ref 1.7–7.7)
NEUTROS PCT: 63 % (ref 43–77)
PLATELETS: 250 10*3/uL (ref 150–400)
RBC: 4.16 MIL/uL — AB (ref 4.22–5.81)
RDW: 16.8 % — ABNORMAL HIGH (ref 11.5–15.5)
WBC: 3.1 10*3/uL — AB (ref 4.0–10.5)

## 2013-06-08 NOTE — Progress Notes (Signed)
Labs drawn today for cbc/diff 

## 2013-06-08 NOTE — Progress Notes (Signed)
[facility]  OFFICE PROGRESS NOTE  Cleda Mccreedy, MD [place] [address]  DIAGNOSIS: Small cell lung cancer, left - Plan: CT Abdomen Pelvis W Contrast, CT Chest W Contrast, CBC with Differential, CBC with Differential  Anemia due to blood loss, chronic  Chief Complaint  Patient presents with  . Lung Cancer    Extensive stage small cell, status post 2 cycles of chemotherapy  . Anemia    Chronic GI blood loss    CURRENT THERAPY: Carboplatin/VP-16 with Neulasta support, 2 cycles last one on 05/30/2013  INTERVAL HISTORY: Hasnain Manheim Woody 73 y.o. male returns for followup of chronic blood loss anemia as well as extensive stage small cell lung cancer having received 2 cycles of VP-16 and carboplatin chemotherapy, last treatment on 05/30/2013. He was started on quinine water because of lower extremity cramping at the time of his last visit. Cramping has disappeared since drinking tonic water every night. Appetite is good with no sore throat, nausea, vomiting, diarrhea, constipation, melena, hematochezia, hematuria, urinary hesitancy, peripheral paresthesias, lower extremity swelling or redness, skin rash, headache, or seizures. He's had some difficulty sleeping due to dexamethasone.  MEDICAL HISTORY: Past Medical History  Diagnosis Date  . MI (myocardial infarction)   . Hypertension   . Hyperlipidemia   . Coronary artery disease   . Dementia 01/24/2013  . GERD (gastroesophageal reflux disease)   . Blood transfusion without reported diagnosis     INTERIM HISTORY: has Acute blood loss anemia; Upper GI bleed; CAD (coronary artery disease); GERD (gastroesophageal reflux disease); Hyperlipidemia; Dark stools; Dizziness; GI bleed; Dementia; Essential hypertension, benign; Other and unspecified hyperlipidemia; Small bowel mass; Helicobacter pylori gastritis (chronic gastritis); Angina pectoris; Lung mass; Bowel wall thickening; Anemia due  to blood loss, chronic; Arteriovenous malformation of gastrointestinal tract; Lymphadenopathy, generalized; and Small cell lung cancer on his problem list.   Patient underwent cervical lymph node fine needle aspiration on 04/19/2013 at which time small cell carcinoma was diagnosed. This finding in conjunction with a large lung mass and diffuse lymphadenopathy involving the abdomen and mediastinum are wall consistent with extensive stage small cell lung cancer. 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    ALLERGIES:  has No Known Allergies.  MEDICATIONS: has a current medication list which includes the following prescription(s): acetaminophen, allopurinol, lidocaine-prilocaine, omeprazole, dexamethasone, metoclopramide, nystatin-triamcinolone ointment, and prochlorperazine.  SURGICAL HISTORY:  Past Surgical History  Procedure Laterality Date  . Coronary artery bypass graft    . Cervical spine surgery    . Esophagogastroduodenoscopy N/A 01/15/2013    Dr. 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 Colonoscopy N/A 01/17/2013    Dr. Oneida Alar: normal TI, normal colon, small internal hemorrhoids  . Appendectomy    . Lymph node biopsy Right 04/2013    cervical    FAMILY HISTORY: family history includes Hypertension in his father and mother. There is no history of Colon cancer.  SOCIAL HISTORY:  reports that he quit smoking about 14 years ago. His smoking use included Cigarettes. He has a 37.5 pack-year smoking history. His smokeless tobacco use includes Snuff. He reports that he does not drink alcohol or use illicit drugs.  REVIEW OF SYSTEMS:  Other than that discussed above is noncontributory.  PHYSICAL EXAMINATION:  ECOG PERFORMANCE STATUS: 1 - Symptomatic but completely  ambulatory  Blood pressure 156/64, pulse 67, temperature 97.5 F (36.4 C), temperature source Oral, resp. rate 18, weight 159 lb 9.6 oz (72.394 kg).  GENERAL:alert, no distress and comfortable. Alopecia. SKIN: skin color, texture, turgor are normal, no rashes or significant lesions EYES: PERLA; Conjunctiva are pink and non-injected, sclera clear OROPHARYNX:no exudate, no erythema on lips, buccal mucosa, or tongue. NECK: supple, thyroid normal size, non-tender, without nodularity. No masses CHEST: Increased AP diameter with no gynecomastia. LYMPH:  no palpable lymphadenopathy in the cervical, axillary or inguinal LUNGS: clear to auscultation and percussion with normal breathing effort HEART: regular rate & rhythm and no murmurs. No S3. ABDOMEN:abdomen soft, non-tender and normal bowel sounds MUSCULOSKELETAL:no cyanosis of digits and no clubbing. Range of motion normal.  NEURO: alert & oriented x 3 with fluent speech, no focal motor/sensory deficits   LABORATORY DATA: Office Visit on 06/08/2013  Component Date Value Range Status  . WBC 06/08/2013 3.1* 4.0 - 10.5 K/uL Final  . RBC 06/08/2013 4.16* 4.22 - 5.81 MIL/uL Final  . Hemoglobin 06/08/2013 10.7* 13.0 - 17.0 g/dL Final  . HCT 06/08/2013 33.7* 39.0 - 52.0 % Final  . MCV 06/08/2013 81.0  78.0 - 100.0 fL Final  . MCH 06/08/2013 25.7* 26.0 - 34.0 pg Final  . MCHC 06/08/2013 31.8  30.0 - 36.0 g/dL Final  . RDW 06/08/2013 16.8* 11.5 - 15.5 % Final  . Platelets 06/08/2013 250  150 - 400 K/uL Final  . Neutrophils Relative % 06/08/2013 63  43 - 77 % Final  . Neutro Abs 06/08/2013 2.0  1.7 - 7.7 K/uL Final  . Lymphocytes Relative 06/08/2013 27  12 - 46 % Final  . Lymphs Abs 06/08/2013 0.8  0.7 - 4.0 K/uL Final  . Monocytes Relative 06/08/2013 10  3 - 12 % Final  . Monocytes Absolute 06/08/2013 0.3  0.1 - 1.0 K/uL Final  . Eosinophils Relative 06/08/2013 1  0 - 5 % Final  . Eosinophils Absolute 06/08/2013 0.0  0.0 - 0.7 K/uL Final  .  Basophils Relative 06/08/2013 0  0 - 1 % Final  . Basophils Absolute 06/08/2013 0.0  0.0 - 0.1 K/uL Final  Infusion on 05/30/2013  Component Date Value Range Status  . WBC 05/30/2013 7.1  4.0 - 10.5 K/uL Final  . RBC 05/30/2013 2.97* 4.22 - 5.81 MIL/uL Final  . Hemoglobin 05/30/2013 7.5* 13.0 - 17.0 g/dL Final  . HCT 05/30/2013 23.8* 39.0 - 52.0 % Final  . MCV 05/30/2013 80.1  78.0 - 100.0 fL Final  . MCH 05/30/2013 25.3* 26.0 - 34.0 pg Final  . MCHC 05/30/2013 31.5  30.0 - 36.0 g/dL Final  . RDW 05/30/2013 17.0* 11.5 - 15.5 % Final  . Platelets 05/30/2013 445* 150 - 400 K/uL Final  . Neutrophils Relative % 05/30/2013 79* 43 - 77 % Final  . Neutro Abs 05/30/2013 5.6  1.7 - 7.7 K/uL Final  . Lymphocytes Relative 05/30/2013 12  12 - 46 % Final  . Lymphs Abs 05/30/2013 0.9  0.7 - 4.0 K/uL Final  . Monocytes Relative 05/30/2013 8  3 - 12 % Final  . Monocytes Absolute 05/30/2013 0.6  0.1 - 1.0 K/uL Final  . Eosinophils Relative 05/30/2013 0  0 - 5 % Final  . Eosinophils Absolute 05/30/2013 0.0  0.0 - 0.7 K/uL Final  . Basophils Relative 05/30/2013 1  0 - 1 % Final  . Basophils Absolute 05/30/2013 0.1  0.0 -  0.1 K/uL Final  . Sodium 05/30/2013 137  135 - 145 mEq/L Final  . Potassium 05/30/2013 3.7  3.5 - 5.1 mEq/L Final  . Chloride 05/30/2013 102  96 - 112 mEq/L Final  . CO2 05/30/2013 23  19 - 32 mEq/L Final  . Glucose, Bld 05/30/2013 127* 70 - 99 mg/dL Final  . BUN 05/30/2013 13  6 - 23 mg/dL Final  . Creatinine, Ser 05/30/2013 1.04  0.50 - 1.35 mg/dL Final  . Calcium 05/30/2013 9.1  8.4 - 10.5 mg/dL Final  . Total Protein 05/30/2013 6.7  6.0 - 8.3 g/dL Final  . Albumin 05/30/2013 2.5* 3.5 - 5.2 g/dL Final  . AST 05/30/2013 15  0 - 37 U/L Final  . ALT 05/30/2013 12  0 - 53 U/L Final  . Alkaline Phosphatase 05/30/2013 99  39 - 117 U/L Final  . Total Bilirubin 05/30/2013 0.2* 0.3 - 1.2 mg/dL Final  . GFR calc non Af Amer 05/30/2013 69* >90 mL/min Final  . GFR calc Af Amer 05/30/2013  80* >90 mL/min Final   Comment: (NOTE)                          The eGFR has been calculated using the CKD EPI equation.                          This calculation has not been validated in all clinical situations.                          eGFR's persistently <90 mL/min signify possible Chronic Kidney                          Disease.  . Magnesium 05/30/2013 2.0  1.5 - 2.5 mg/dL Final  . ABO/RH(D) 05/30/2013 O POS   Final  . Antibody Screen 05/30/2013 NEG   Final  . Sample Expiration 05/30/2013 06/02/2013   Final  . Unit Number 05/30/2013 [id number]   Final  . Blood Component Type 05/30/2013 RED CELLS,LR   Final  . Unit division 05/30/2013 00   Final  . Status of Unit 05/30/2013 ISSUED,FINAL   Final  . Transfusion Status 05/30/2013 OK TO TRANSFUSE   Final  . Crossmatch Result 05/30/2013 Compatible   Final  . Unit Number 05/30/2013 [id number]   Final  . Blood Component Type 05/30/2013 RED CELLS,LR   Final  . Unit division 05/30/2013 00   Final  . Status of Unit 05/30/2013 ISSUED,FINAL   Final  . Transfusion Status 05/30/2013 OK TO TRANSFUSE   Final  . Crossmatch Result 05/30/2013 Compatible   Final  Infusion on 05/23/2013  Component Date Value Range Status  . WBC 05/23/2013 8.8  4.0 - 10.5 K/uL Final  . RBC 05/23/2013 3.24* 4.22 - 5.81 MIL/uL Final  . Hemoglobin 05/23/2013 8.3* 13.0 - 17.0 g/dL Final  . HCT 05/23/2013 25.9* 39.0 - 52.0 % Final  . MCV 05/23/2013 79.9  78.0 - 100.0 fL Final  . MCH 05/23/2013 25.6* 26.0 - 34.0 pg Final  . MCHC 05/23/2013 32.0  30.0 - 36.0 g/dL Final  . RDW 05/23/2013 16.3* 11.5 - 15.5 % Final  . Platelets 05/23/2013 198  150 - 400 K/uL Final  . Neutrophils Relative % 05/23/2013 80* 43 - 77 % Final  . Neutro Abs 05/23/2013 7.0  1.7 - 7.7 K/uL Final  .  Lymphocytes Relative 05/23/2013 13  12 - 46 % Final  . Lymphs Abs 05/23/2013 1.2  0.7 - 4.0 K/uL Final  . Monocytes Relative 05/23/2013 7  3 - 12 % Final  . Monocytes Absolute 05/23/2013 0.6  0.1 -  1.0 K/uL Final  . Eosinophils Relative 05/23/2013 1  0 - 5 % Final  . Eosinophils Absolute 05/23/2013 0.1  0.0 - 0.7 K/uL Final  . Basophils Relative 05/23/2013 0  0 - 1 % Final  . Basophils Absolute 05/23/2013 0.0  0.0 - 0.1 K/uL Final  Infusion on 05/16/2013  Component Date Value Range Status  . WBC 05/16/2013 2.2* 4.0 - 10.5 K/uL Final  . RBC 05/16/2013 3.69* 4.22 - 5.81 MIL/uL Final  . Hemoglobin 05/16/2013 9.5* 13.0 - 17.0 g/dL Final  . HCT 05/16/2013 29.6* 39.0 - 52.0 % Final  . MCV 05/16/2013 80.2  78.0 - 100.0 fL Final  . MCH 05/16/2013 25.7* 26.0 - 34.0 pg Final  . MCHC 05/16/2013 32.1  30.0 - 36.0 g/dL Final  . RDW 05/16/2013 15.7* 11.5 - 15.5 % Final  . Platelets 05/16/2013 287  150 - 400 K/uL Final  . Neutrophils Relative % 05/16/2013 69  43 - 77 % Final  . Neutro Abs 05/16/2013 1.6* 1.7 - 7.7 K/uL Final  . Lymphocytes Relative 05/16/2013 23  12 - 46 % Final  . Lymphs Abs 05/16/2013 0.5* 0.7 - 4.0 K/uL Final  . Monocytes Relative 05/16/2013 4  3 - 12 % Final  . Monocytes Absolute 05/16/2013 0.1  0.1 - 1.0 K/uL Final  . Eosinophils Relative 05/16/2013 4  0 - 5 % Final  . Eosinophils Absolute 05/16/2013 0.1  0.0 - 0.7 K/uL Final  . Basophils Relative 05/16/2013 0  0 - 1 % Final  . Basophils Absolute 05/16/2013 0.0  0.0 - 0.1 K/uL Final    PATHOLOGY: No new pathology.  Urinalysis    Component Value Date/Time   COLORURINE YELLOW 01/14/2013 1938   APPEARANCEUR CLEAR 01/14/2013 1938   LABSPEC 1.015 01/14/2013 1938   PHURINE 5.5 01/14/2013 1938   GLUCOSEU NEGATIVE 01/14/2013 1938   HGBUR NEGATIVE 01/14/2013 1938   BILIRUBINUR NEGATIVE 01/14/2013 1938   KETONESUR NEGATIVE 01/14/2013 1938   PROTEINUR NEGATIVE 01/14/2013 1938   UROBILINOGEN 0.2 01/14/2013 1938   NITRITE NEGATIVE 01/14/2013 1938   LEUKOCYTESUR NEGATIVE 01/14/2013 1938    RADIOGRAPHIC STUDIES: Repeat CT scan will be done on 06/16/2013  ASSESSMENT:  #1.Extensive stage small cell lung cancer, for cycle 2 of  chemotherapy today.  #2. Nocturnal cramping, improved with quinine water administration..  #3. Chronic obstructive pulmonary disease  #4. 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 #5. Axillary inflammation secondary to topical fungus infection, improved with Mycolog ointment #6. Leukopenia secondary to chemotherapy.   PLAN:  #1. Continue tonic water daily and use Xanax for insomnia associated with dexamethasone. #2. Transfuse if hemoglobin is less than 9. #3. CT scans chest abdomen and pelvis 06/16/2013 to assess response. #4. Anticipate cycle 3 of chemotherapy on 06/20/2013.   All questions were answered. The patient knows to call the clinic with any problems, questions or concerns. We can certainly see the patient much sooner if necessary.   I spent 25 minutes counseling the patient face to face. The total time spent in the appointment was 30 minutes.    Doroteo Bradford, MD 06/08/2013 10:23 AM

## 2013-06-08 NOTE — Patient Instructions (Signed)
.  [facility] Discharge Instructions  RECOMMENDATIONS MADE BY THE CONSULTANT AND ANY TEST RESULTS WILL BE SENT TO YOUR REFERRING PHYSICIAN.  EXAM FINDINGS BY THE PHYSICIAN TODAY AND SIGNS OR SYMPTOMS TO REPORT TO CLINIC OR PRIMARY PHYSICIAN: Exam and findings as discussed by Dr. Barnet Glasgow. INSTRUCTIONS/FOLLOW-UP: CT scans Mid January  You may call us the day after scans  Thank you for choosing [facility] to provide your oncology and hematology care.  To afford each patient quality time with our providers, please arrive at least 15 minutes before your scheduled appointment time.  With your help, our goal is to use those 15 minutes to complete the necessary work-up to ensure our physicians have the information they need to help with your evaluation and healthcare recommendations.    Effective January 1st, 2014, we ask that you re-schedule your appointment with our physicians should you arrive 10 or more minutes late for your appointment.  We strive to give you quality time with our providers, and arriving late affects you and other patients whose appointments are after yours.    Again, thank you for choosing [facility].  Our hope is that these requests will decrease the amount of time that you wait before being seen by our physicians.       _____________________________________________________________  Should you have questions after your visit to [facility], please contact our office at [phone] between the hours of 8:30 a.m. and 5:00 p.m.  Voicemails left after 4:30 p.m. will not be returned until the following business day.  For prescription refill requests, have your pharmacy contact our office with your prescription refill request.

## 2013-06-16 ENCOUNTER — Ambulatory Visit (HOSPITAL_COMMUNITY)
Admission: RE | Admit: 2013-06-16 | Discharge: 2013-06-16 | Disposition: A | Discharge status: Home / Self Care | Payer: Medicare Other | Source: Ambulatory Visit | Attending: Hematology and Oncology | Admitting: Hematology and Oncology

## 2013-06-16 DIAGNOSIS — C3490 Malignant neoplasm of unspecified part of unspecified bronchus or lung: Secondary | ICD-10-CM | POA: Insufficient documentation

## 2013-06-16 DIAGNOSIS — E278 Other specified disorders of adrenal gland: Secondary | ICD-10-CM | POA: Insufficient documentation

## 2013-06-16 DIAGNOSIS — C3492 Malignant neoplasm of unspecified part of left bronchus or lung: Secondary | ICD-10-CM

## 2013-06-16 MED ORDER — IOHEXOL 300 MG/ML  SOLN
100.0000 mL | Freq: Once | INTRAMUSCULAR | Status: AC | PRN
  Administered 2013-06-16: 100 mL via INTRAVENOUS

## 2013-06-20 ENCOUNTER — Encounter (HOSPITAL_COMMUNITY): Payer: Self-pay

## 2013-06-20 ENCOUNTER — Encounter (HOSPITAL_BASED_OUTPATIENT_CLINIC_OR_DEPARTMENT_OTHER): Payer: Medicare Other

## 2013-06-20 VITALS — BP 141/74 | HR 64 | Temp 97.8°F | Resp 18 | Wt 165.0 lb

## 2013-06-20 DIAGNOSIS — R591 Generalized enlarged lymph nodes: Secondary | ICD-10-CM

## 2013-06-20 DIAGNOSIS — D500 Iron deficiency anemia secondary to blood loss (chronic): Secondary | ICD-10-CM

## 2013-06-20 DIAGNOSIS — R918 Other nonspecific abnormal finding of lung field: Secondary | ICD-10-CM

## 2013-06-20 DIAGNOSIS — C3490 Malignant neoplasm of unspecified part of unspecified bronchus or lung: Secondary | ICD-10-CM

## 2013-06-20 DIAGNOSIS — J449 Chronic obstructive pulmonary disease, unspecified: Secondary | ICD-10-CM

## 2013-06-20 DIAGNOSIS — D649 Anemia, unspecified: Secondary | ICD-10-CM

## 2013-06-20 DIAGNOSIS — Z5111 Encounter for antineoplastic chemotherapy: Secondary | ICD-10-CM

## 2013-06-20 LAB — CBC WITH DIFFERENTIAL/PLATELET
Basophils Absolute: 0.1 10*3/uL (ref 0.0–0.1)
Basophils Relative: 1 % (ref 0–1)
EOS ABS: 0 10*3/uL (ref 0.0–0.7)
EOS PCT: 0 % (ref 0–5)
HCT: 30 % — ABNORMAL LOW (ref 39.0–52.0)
Hemoglobin: 9.8 g/dL — ABNORMAL LOW (ref 13.0–17.0)
Lymphocytes Relative: 13 % (ref 12–46)
Lymphs Abs: 1 10*3/uL (ref 0.7–4.0)
MCH: 27 pg (ref 26.0–34.0)
MCHC: 32.7 g/dL (ref 30.0–36.0)
MCV: 82.6 fL (ref 78.0–100.0)
Monocytes Absolute: 0.6 10*3/uL (ref 0.1–1.0)
Monocytes Relative: 8 % (ref 3–12)
Neutro Abs: 6.1 10*3/uL (ref 1.7–7.7)
Neutrophils Relative %: 78 % — ABNORMAL HIGH (ref 43–77)
PLATELETS: 223 10*3/uL (ref 150–400)
RBC: 3.63 MIL/uL — ABNORMAL LOW (ref 4.22–5.81)
RDW: 19.3 % — ABNORMAL HIGH (ref 11.5–15.5)
WBC: 7.8 10*3/uL (ref 4.0–10.5)

## 2013-06-20 LAB — COMPREHENSIVE METABOLIC PANEL
ALT: 14 U/L (ref 0–53)
AST: 17 U/L (ref 0–37)
Albumin: 3.1 g/dL — ABNORMAL LOW (ref 3.5–5.2)
Alkaline Phosphatase: 102 U/L (ref 39–117)
BUN: 15 mg/dL (ref 6–23)
CALCIUM: 9 mg/dL (ref 8.4–10.5)
CO2: 26 mEq/L (ref 19–32)
Chloride: 101 mEq/L (ref 96–112)
Creatinine, Ser: 0.96 mg/dL (ref 0.50–1.35)
GFR calc non Af Amer: 80 mL/min — ABNORMAL LOW (ref >90)
Glucose, Bld: 86 mg/dL (ref 70–99)
Potassium: 4.2 mEq/L (ref 3.7–5.3)
SODIUM: 138 meq/L (ref 137–147)
TOTAL PROTEIN: 6.9 g/dL (ref 6.0–8.3)
Total Bilirubin: 0.2 mg/dL — ABNORMAL LOW (ref 0.3–1.2)

## 2013-06-20 MED ORDER — SODIUM CHLORIDE 0.9 % IV SOLN: 16.0000 mg | Freq: Once | INTRAVENOUS | Status: DC

## 2013-06-20 MED ORDER — SODIUM CHLORIDE 0.9 % IJ SOLN
10.0000 mL | INTRAMUSCULAR | Status: DC | PRN
Start: 2013-06-20 — End: 2013-06-20
  Administered 2013-06-20: 10 mL

## 2013-06-20 MED ORDER — CARBOPLATIN CHEMO INJECTION 600 MG/60ML
437.0000 mg | Freq: Once | INTRAVENOUS | Status: AC
  Administered 2013-06-20: 440 mg via INTRAVENOUS
  Filled 2013-06-20: qty 44

## 2013-06-20 MED ORDER — DEXAMETHASONE SODIUM PHOSPHATE 10 MG/ML IJ SOLN
Freq: Once | INTRAMUSCULAR | Status: AC
Start: 2013-06-20 — End: 2013-06-20
  Administered 2013-06-20: 16 mg via INTRAVENOUS
  Filled 2013-06-20: qty 8

## 2013-06-20 MED ORDER — HEPARIN SOD (PORK) LOCK FLUSH 100 UNIT/ML IV SOLN
500.0000 [IU] | Freq: Once | INTRAVENOUS | Status: AC | PRN
  Administered 2013-06-20: 500 [IU]

## 2013-06-20 MED ORDER — DEXAMETHASONE SODIUM PHOSPHATE 10 MG/ML IJ SOLN: 20.0000 mg | Freq: Once | INTRAMUSCULAR | Status: DC

## 2013-06-20 MED ORDER — SODIUM CHLORIDE 0.9 % IV SOLN
Freq: Once | INTRAVENOUS | Status: AC
  Administered 2013-06-20: 09:00:00 via INTRAVENOUS

## 2013-06-20 MED ORDER — SODIUM CHLORIDE 0.9 % IV SOLN
100.0000 mg/m2 | Freq: Once | INTRAVENOUS | Status: AC
  Administered 2013-06-20: 190 mg via INTRAVENOUS
  Filled 2013-06-20: qty 9.5

## 2013-06-20 MED ORDER — HEPARIN SOD (PORK) LOCK FLUSH 100 UNIT/ML IV SOLN
INTRAVENOUS | Status: AC
  Filled 2013-06-20: qty 5

## 2013-06-20 NOTE — Patient Instructions (Signed)
[facility] Discharge Instructions  RECOMMENDATIONS MADE BY THE CONSULTANT AND ANY TEST RESULTS WILL BE SENT TO YOUR REFERRING PHYSICIAN.  EXAM FINDINGS BY THE PHYSICIAN TODAY AND SIGNS OR SYMPTOMS TO REPORT TO CLINIC OR PRIMARY PHYSICIAN: Exam and findings as discussed by Dr. Barnet Glasgow.  Scans show that your areas of involvement has gotten smaller so your cancer is responding to the chemotherapy.  Report fevers, chills, uncontrolled nausea, vomiting or other problems.  MEDICATIONS PRESCRIBED:  none  INSTRUCTIONS/FOLLOW-UP: Follow-up in 3 weeks with chemotherapy and office visit.  Thank you for choosing [facility] to provide your oncology and hematology care.  To afford each patient quality time with our providers, please arrive at least 15 minutes before your scheduled appointment time.  With your help, our goal is to use those 15 minutes to complete the necessary work-up to ensure our physicians have the information they need to help with your evaluation and healthcare recommendations.    Effective January 1st, 2014, we ask that you re-schedule your appointment with our physicians should you arrive 10 or more minutes late for your appointment.  We strive to give you quality time with our providers, and arriving late affects you and other patients whose appointments are after yours.    Again, thank you for choosing [facility]  Our hope is that these requests will decrease the amount of time that you wait before being seen by our physicians.       _____________________________________________________________  Should you have questions after your visit to [facility], please contact our office at [phone] between the hours of 8:30 a.m. and 5:00 p.m.  Voicemails left after 4:30 p.m. will not be returned until the following business day.  For prescription refill requests, have your pharmacy contact our office with your prescription  refill request.

## 2013-06-20 NOTE — Progress Notes (Signed)
[facility]  OFFICE PROGRESS NOTE  Cleda Mccreedy, MD [place] [address]  DIAGNOSIS: Lung cancer, small cell, extensive  Anemia due to blood loss, chronic  Chief Complaint  Patient presents with  . Lung Cancer    Extensive stage small cell for cycle #3 of chemotherapy today    CURRENT THERAPY: Carboplatin/VP-16 with Neulasta support to begin cycle #3 today. 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 Appetite has been good with no nausea, vomiting, diarrhea, or constipation. Stools are not black. He denies any cough, wheezing, PND, orthopnea, palpitations, chest pain, abdominal pain, lower extremity swelling or redness, joint pain, skin rash, headache, or seizures. He does develop left lower extremity paresthesias when he sits for a while but after walking the symptoms disappear. He denies any upper extremity paresthesias.  MEDICAL HISTORY: Past Medical History  Diagnosis Date  . MI (myocardial infarction)   . Hypertension   . Hyperlipidemia   . Coronary artery disease   . Dementia 01/24/2013  . GERD (gastroesophageal reflux disease)   . Blood transfusion without reported diagnosis     INTERIM HISTORY: has Acute blood loss anemia; Upper GI bleed; CAD (coronary artery disease); GERD (gastroesophageal reflux disease); Hyperlipidemia; Dark stools; Dizziness; GI bleed; Dementia; Essential hypertension, benign; Other and unspecified hyperlipidemia; Small bowel mass; Helicobacter pylori gastritis (chronic gastritis); Angina pectoris; Lung mass; Bowel wall thickening; Anemia due to blood loss, chronic; Arteriovenous  malformation of gastrointestinal tract; Lymphadenopathy, generalized; and Small cell lung cancer on his problem list.   Patient underwent cervical lymph node fine needle aspiration on 04/19/2013 at which time small cell carcinoma was diagnosed. This finding in conjunction with a large lung mass and diffuse lymphadenopathy involving the abdomen and mediastinum are wall consistent with extensive stage small cell lung cancer. 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 CT scan done on 06/16/2013 showed excellent response so cycle #3 will be instituted on 06/20/2013.  ALLERGIES:  has No Known Allergies.  MEDICATIONS: has a current medication list which includes the following prescription(s): acetaminophen, allopurinol, dexamethasone, lidocaine-prilocaine, metoclopramide, omeprazole, prochlorperazine, and nystatin-triamcinolone ointment, and the following Facility-Administered Medications: CARBOplatin (PARAPLATIN) 440 mg in sodium chloride 0.9 % 250 mL chemo infusion, etoposide (VEPESID) 190 mg in sodium chloride 0.9 % 500 mL chemo infusion, heparin lock flush, ondansetron (ZOFRAN) 16 mg, dexamethasone (DECADRON) 20 mg in sodium chloride 0.9 % 50 mL IVPB, and sodium chloride.  SURGICAL HISTORY:  Past Surgical History  Procedure Laterality Date  . Coronary artery bypass graft    . Cervical spine surgery    . Esophagogastroduodenoscopy N/A 01/15/2013    Dr. 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 Colonoscopy N/A 01/17/2013    Dr. Oneida Alar: normal TI, normal colon, small internal hemorrhoids  . Appendectomy    . Lymph node biopsy Right 04/2013    cervical    FAMILY HISTORY: family history includes Hypertension in his father and mother. There is no history of Colon cancer.  SOCIAL  HISTORY:  reports  that he quit smoking about 14 years ago. His smoking use included Cigarettes. He has a 37.5 pack-year smoking history. His smokeless tobacco use includes Snuff. He reports that he does not drink alcohol or use illicit drugs.  REVIEW OF SYSTEMS:  Other than that discussed above is noncontributory.  PHYSICAL EXAMINATION: ECOG PERFORMANCE STATUS: 1 - Symptomatic but completely ambulatory  Blood pressure 141/74, pulse 64, temperature 97.8 F (36.6 C), temperature source Oral, resp. rate 18, weight 165 lb (74.844 kg).  GENERAL:alert, no distress and comfortable. Pallor. SKIN: skin color, texture, turgor are normal, no rashes or significant lesions EYES: PERLA; Conjunctiva are pink and non-injected, sclera clear OROPHARYNX:no exudate, no erythema on lips, buccal mucosa, or tongue. NECK: supple, thyroid normal size, non-tender, without nodularity. No masses CHEST: Increased AP diameter with no gynecomastia. LYMPH:  no palpable lymphadenopathy in the cervical, axillary or inguinal LUNGS: clear to auscultation and percussion with normal breathing effort HEART: regular rate & rhythm and no murmurs. ABDOMEN:abdomen soft, non-tender and normal bowel sounds MUSCULOSKELETAL:no cyanosis of digits and no clubbing. Range of motion normal.  NEURO: alert & oriented x 3 with fluent speech, no focal motor/sensory deficits   LABORATORY DATA: Infusion on 06/20/2013  Component Date Value Range Status  . WBC 06/20/2013 7.8  4.0 - 10.5 K/uL Final  . RBC 06/20/2013 3.63* 4.22 - 5.81 MIL/uL Final  . Hemoglobin 06/20/2013 9.8* 13.0 - 17.0 g/dL Final  . HCT 06/20/2013 30.0* 39.0 - 52.0 % Final  . MCV 06/20/2013 82.6  78.0 - 100.0 fL Final  . MCH 06/20/2013 27.0  26.0 - 34.0 pg Final  . MCHC 06/20/2013 32.7  30.0 - 36.0 g/dL Final  . RDW 06/20/2013 19.3* 11.5 - 15.5 % Final  . Platelets 06/20/2013 223  150 - 400 K/uL Final  . Neutrophils Relative % 06/20/2013 78* 43 - 77 % Final  . Neutro Abs 06/20/2013 6.1   1.7 - 7.7 K/uL Final  . Lymphocytes Relative 06/20/2013 13  12 - 46 % Final  . Lymphs Abs 06/20/2013 1.0  0.7 - 4.0 K/uL Final  . Monocytes Relative 06/20/2013 8  3 - 12 % Final  . Monocytes Absolute 06/20/2013 0.6  0.1 - 1.0 K/uL Final  . Eosinophils Relative 06/20/2013 0  0 - 5 % Final  . Eosinophils Absolute 06/20/2013 0.0  0.0 - 0.7 K/uL Final  . Basophils Relative 06/20/2013 1  0 - 1 % Final  . Basophils Absolute 06/20/2013 0.1  0.0 - 0.1 K/uL Final  . Sodium 06/20/2013 138  137 - 147 mEq/L Final  . Potassium 06/20/2013 4.2  3.7 - 5.3 mEq/L Final  . Chloride 06/20/2013 101  96 - 112 mEq/L Final  . CO2 06/20/2013 26  19 - 32 mEq/L Final  . Glucose, Bld 06/20/2013 86  70 - 99 mg/dL Final  . BUN 06/20/2013 15  6 - 23 mg/dL Final  . Creatinine, Ser 06/20/2013 0.96  0.50 - 1.35 mg/dL Final  . Calcium 06/20/2013 9.0  8.4 - 10.5 mg/dL Final  . Total Protein 06/20/2013 6.9  6.0 - 8.3 g/dL Final  . Albumin 06/20/2013 3.1* 3.5 - 5.2 g/dL Final  . AST 06/20/2013 17  0 - 37 U/L Final  . ALT 06/20/2013 14  0 - 53 U/L Final  . Alkaline Phosphatase 06/20/2013 102  39 - 117 U/L Final  . Total Bilirubin 06/20/2013 0.2* 0.3 - 1.2 mg/dL Final  . GFR calc non Af Amer 06/20/2013 80* >90  mL/min Final  . GFR calc Af Amer 06/20/2013 >90  >90 mL/min Final   Comment: (NOTE)                          The eGFR has been calculated using the CKD EPI equation.                          This calculation has not been validated in all clinical situations.                          eGFR's persistently <90 mL/min signify possible Chronic Kidney                          Disease.  Office Visit on 06/08/2013  Component Date Value Range Status  . WBC 06/08/2013 3.1* 4.0 - 10.5 K/uL Final  . RBC 06/08/2013 4.16* 4.22 - 5.81 MIL/uL Final  . Hemoglobin 06/08/2013 10.7* 13.0 - 17.0 g/dL Final  . HCT 06/08/2013 33.7* 39.0 - 52.0 % Final  . MCV 06/08/2013 81.0  78.0 - 100.0 fL Final  . MCH 06/08/2013 25.7* 26.0 - 34.0 pg  Final  . MCHC 06/08/2013 31.8  30.0 - 36.0 g/dL Final  . RDW 06/08/2013 16.8* 11.5 - 15.5 % Final  . Platelets 06/08/2013 250  150 - 400 K/uL Final  . Neutrophils Relative % 06/08/2013 63  43 - 77 % Final  . Neutro Abs 06/08/2013 2.0  1.7 - 7.7 K/uL Final  . Lymphocytes Relative 06/08/2013 27  12 - 46 % Final  . Lymphs Abs 06/08/2013 0.8  0.7 - 4.0 K/uL Final  . Monocytes Relative 06/08/2013 10  3 - 12 % Final  . Monocytes Absolute 06/08/2013 0.3  0.1 - 1.0 K/uL Final  . Eosinophils Relative 06/08/2013 1  0 - 5 % Final  . Eosinophils Absolute 06/08/2013 0.0  0.0 - 0.7 K/uL Final  . Basophils Relative 06/08/2013 0  0 - 1 % Final  . Basophils Absolute 06/08/2013 0.0  0.0 - 0.1 K/uL Final  Infusion on 05/30/2013  Component Date Value Range Status  . WBC 05/30/2013 7.1  4.0 - 10.5 K/uL Final  . RBC 05/30/2013 2.97* 4.22 - 5.81 MIL/uL Final  . Hemoglobin 05/30/2013 7.5* 13.0 - 17.0 g/dL Final  . HCT 05/30/2013 23.8* 39.0 - 52.0 % Final  . MCV 05/30/2013 80.1  78.0 - 100.0 fL Final  . MCH 05/30/2013 25.3* 26.0 - 34.0 pg Final  . MCHC 05/30/2013 31.5  30.0 - 36.0 g/dL Final  . RDW 05/30/2013 17.0* 11.5 - 15.5 % Final  . Platelets 05/30/2013 445* 150 - 400 K/uL Final  . Neutrophils Relative % 05/30/2013 79* 43 - 77 % Final  . Neutro Abs 05/30/2013 5.6  1.7 - 7.7 K/uL Final  . Lymphocytes Relative 05/30/2013 12  12 - 46 % Final  . Lymphs Abs 05/30/2013 0.9  0.7 - 4.0 K/uL Final  . Monocytes Relative 05/30/2013 8  3 - 12 % Final  . Monocytes Absolute 05/30/2013 0.6  0.1 - 1.0 K/uL Final  . Eosinophils Relative 05/30/2013 0  0 - 5 % Final  . Eosinophils Absolute 05/30/2013 0.0  0.0 - 0.7 K/uL Final  . Basophils Relative 05/30/2013 1  0 - 1 % Final  . Basophils Absolute 05/30/2013 0.1  0.0 - 0.1 K/uL Final  . Sodium 05/30/2013  137  135 - 145 mEq/L Final  . Potassium 05/30/2013 3.7  3.5 - 5.1 mEq/L Final  . Chloride 05/30/2013 102  96 - 112 mEq/L Final  . CO2 05/30/2013 23  19 - 32 mEq/L  Final  . Glucose, Bld 05/30/2013 127* 70 - 99 mg/dL Final  . BUN 05/30/2013 13  6 - 23 mg/dL Final  . Creatinine, Ser 05/30/2013 1.04  0.50 - 1.35 mg/dL Final  . Calcium 05/30/2013 9.1  8.4 - 10.5 mg/dL Final  . Total Protein 05/30/2013 6.7  6.0 - 8.3 g/dL Final  . Albumin 05/30/2013 2.5* 3.5 - 5.2 g/dL Final  . AST 05/30/2013 15  0 - 37 U/L Final  . ALT 05/30/2013 12  0 - 53 U/L Final  . Alkaline Phosphatase 05/30/2013 99  39 - 117 U/L Final  . Total Bilirubin 05/30/2013 0.2* 0.3 - 1.2 mg/dL Final  . GFR calc non Af Amer 05/30/2013 69* >90 mL/min Final  . GFR calc Af Amer 05/30/2013 80* >90 mL/min Final   Comment: (NOTE)                          The eGFR has been calculated using the CKD EPI equation.                          This calculation has not been validated in all clinical situations.                          eGFR's persistently <90 mL/min signify possible Chronic Kidney                          Disease.  . Magnesium 05/30/2013 2.0  1.5 - 2.5 mg/dL Final  . ABO/RH(D) 05/30/2013 O POS   Final  . Antibody Screen 05/30/2013 NEG   Final  . Sample Expiration 05/30/2013 06/02/2013   Final  . Unit Number 05/30/2013 [id number]   Final  . Blood Component Type 05/30/2013 RED CELLS,LR   Final  . Unit division 05/30/2013 00   Final  . Status of Unit 05/30/2013 ISSUED,FINAL   Final  . Transfusion Status 05/30/2013 OK TO TRANSFUSE   Final  . Crossmatch Result 05/30/2013 Compatible   Final  . Unit Number 05/30/2013 [id number]   Final  . Blood Component Type 05/30/2013 RED CELLS,LR   Final  . Unit division 05/30/2013 00   Final  . Status of Unit 05/30/2013 ISSUED,FINAL   Final  . Transfusion Status 05/30/2013 OK TO TRANSFUSE   Final  . Crossmatch Result 05/30/2013 Compatible   Final  Infusion on 05/23/2013  Component Date Value Range Status  . WBC 05/23/2013 8.8  4.0 - 10.5 K/uL Final  . RBC 05/23/2013 3.24* 4.22 - 5.81 MIL/uL Final  . Hemoglobin 05/23/2013 8.3* 13.0 - 17.0 g/dL  Final  . HCT 05/23/2013 25.9* 39.0 - 52.0 % Final  . MCV 05/23/2013 79.9  78.0 - 100.0 fL Final  . MCH 05/23/2013 25.6* 26.0 - 34.0 pg Final  . MCHC 05/23/2013 32.0  30.0 - 36.0 g/dL Final  . RDW 05/23/2013 16.3* 11.5 - 15.5 % Final  . Platelets 05/23/2013 198  150 - 400 K/uL Final  . Neutrophils Relative % 05/23/2013 80* 43 - 77 % Final  . Neutro Abs 05/23/2013 7.0  1.7 - 7.7 K/uL Final  . Lymphocytes Relative 05/23/2013 13  12 - 46 % Final  . Lymphs Abs 05/23/2013 1.2  0.7 - 4.0 K/uL Final  . Monocytes Relative 05/23/2013 7  3 - 12 % Final  . Monocytes Absolute 05/23/2013 0.6  0.1 - 1.0 K/uL Final  . Eosinophils Relative 05/23/2013 1  0 - 5 % Final  . Eosinophils Absolute 05/23/2013 0.1  0.0 - 0.7 K/uL Final  . Basophils Relative 05/23/2013 0  0 - 1 % Final  . Basophils Absolute 05/23/2013 0.0  0.0 - 0.1 K/uL Final    PATHOLOGY: No new pathology.  Urinalysis    Component Value Date/Time   COLORURINE YELLOW 01/14/2013 1938   APPEARANCEUR CLEAR 01/14/2013 1938   LABSPEC 1.015 01/14/2013 1938   PHURINE 5.5 01/14/2013 1938   GLUCOSEU NEGATIVE 01/14/2013 1938   HGBUR NEGATIVE 01/14/2013 1938   BILIRUBINUR NEGATIVE 01/14/2013 1938   KETONESUR NEGATIVE 01/14/2013 1938   PROTEINUR NEGATIVE 01/14/2013 1938   UROBILINOGEN 0.2 01/14/2013 1938   NITRITE NEGATIVE 01/14/2013 1938   LEUKOCYTESUR NEGATIVE 01/14/2013 1938    RADIOGRAPHIC STUDIES: Ct Chest W Contrast  06/16/2013   CLINICAL DATA:  Small cell lung cancer  EXAM: CT CHEST, ABDOMEN, AND PELVIS WITH CONTRAST  TECHNIQUE: Multidetector CT imaging of the chest, abdomen and pelvis was performed following the standard protocol during bolus administration of intravenous contrast.  CONTRAST:  OMNIPAQUE IOHEXOL 300 MG/ML  SOLN  COMPARISON:  CT chest 01/14/2013. CT abdomen and pelvis 01/16/2013. PET-CT 04/08/2013.  FINDINGS: CT CHEST FINDINGS  There is no pleural effusion identified. Right upper lobe pulmonary nodule measures 2.2 cm, image 17/  series 2. Previously 3.5 cm. Right hilar tumor measures 1.4 x 2.7 cm, image 55/ series 4. Previously this measures 2.9 x 3.4 cm. No new or progressive mediastinal or hilar adenopathy noted. There is no new or enlarging pulmonary nodules or masses.  The trachea appears patent and is midline. The heart size appears normal. No pericardial effusion. There is calcified atherosclerotic set the patient is status post median sternotomy and CABG procedure. No enlarged axillary or supraclavicular lymph nodes.  Review of the visualized osseous structures is significant for mild multi level thoracic spondylosis. No aggressive lytic or sclerotic bone lesions noted.  CT ABDOMEN AND PELVIS FINDINGS  Scattered foci of low attenuation noted within the liver. This is unchanged from previous exam. The largest measures 7 mm, image 52/series 2. Gallbladder appears normal. No biliary dilatation. Normal appearance of the pancreas. The spleen is unremarkable. Right adrenal nodule measures 6 mm, image 62/ series 2. Previously 10 mm. Decrease in size of left adrenal nodule. This currently measures 8 mm, image 63/ series 2. Previously 1.6 cm left adrenal stress set The kidneys are both on unremarkable. The urinary bladder is negative. Prostate gland and seminal vesicles are on unremarkable.  Calcified atherosclerotic disease affects the abdominal aorta and its branches. There is no aneurysm. No upper abdominal adenopathy. There is no pelvic or inguinal adenopathy identified. No free fluid or fluid collections identified.  The stomach is normal. The small bowel loops have a normal course and caliber. There has been interval resolution of abnormal small bowel wall thickening compatible with lymphoma. No new areas of bowel wall thickening or mass identified.  There are a few scattered mesenteric lymph nodes which measure up to 1 cm. Index jejunal mesenteric lymph node measures 1 cm, image 81/ series 2. Previously this measured 2.5 cm. No new or  enlarging mesenteric adenopathy. No obstruction. Normal appearance of the colon. The stress set  there is no free fluid or abnormal fluid collections identified within the abdomen or pelvis. Review of the visualized osseous structures is significant for mild spondylosis within the lumbar spine. No aggressive lytic or sclerotic bone lesions.  IMPRESSION: CT chest:  1. Overall interval decrease in size of right upper lobe and right hilar tumor. 2. No new or progressive findings identified CT abdomen and pelvis:  1. No acute findings. 2. There has been interval response to therapy. Specifically, there has been resolution of previous abnormal small bowel wall thickening compatible. Bilateral adrenal nodules have decreased in size in the interval. Further, there is been significant decrease in size of mesenteric lymph nodes.   Electronically Signed   By: Signa Kell M.D.   On: 06/16/2013 14:48   Ct Abdomen Pelvis W Contrast  06/16/2013   CLINICAL DATA:  Small cell lung cancer  EXAM: CT CHEST, ABDOMEN, AND PELVIS WITH CONTRAST  TECHNIQUE: Multidetector CT imaging of the chest, abdomen and pelvis was performed following the standard protocol during bolus administration of intravenous contrast.  CONTRAST:  OMNIPAQUE IOHEXOL 300 MG/ML  SOLN  COMPARISON:  CT chest 01/14/2013. CT abdomen and pelvis 01/16/2013. PET-CT 04/08/2013.  FINDINGS: CT CHEST FINDINGS  There is no pleural effusion identified. Right upper lobe pulmonary nodule measures 2.2 cm, image 17/ series 2. Previously 3.5 cm. Right hilar tumor measures 1.4 x 2.7 cm, image 55/ series 4. Previously this measures 2.9 x 3.4 cm. No new or progressive mediastinal or hilar adenopathy noted. There is no new or enlarging pulmonary nodules or masses.  The trachea appears patent and is midline. The heart size appears normal. No pericardial effusion. There is calcified atherosclerotic set the patient is status post median sternotomy and CABG procedure. No enlarged  axillary or supraclavicular lymph nodes.  Review of the visualized osseous structures is significant for mild multi level thoracic spondylosis. No aggressive lytic or sclerotic bone lesions noted.  CT ABDOMEN AND PELVIS FINDINGS  Scattered foci of low attenuation noted within the liver. This is unchanged from previous exam. The largest measures 7 mm, image 52/series 2. Gallbladder appears normal. No biliary dilatation. Normal appearance of the pancreas. The spleen is unremarkable. Right adrenal nodule measures 6 mm, image 62/ series 2. Previously 10 mm. Decrease in size of left adrenal nodule. This currently measures 8 mm, image 63/ series 2. Previously 1.6 cm left adrenal stress set The kidneys are both on unremarkable. The urinary bladder is negative. Prostate gland and seminal vesicles are on unremarkable.  Calcified atherosclerotic disease affects the abdominal aorta and its branches. There is no aneurysm. No upper abdominal adenopathy. There is no pelvic or inguinal adenopathy identified. No free fluid or fluid collections identified.  The stomach is normal. The small bowel loops have a normal course and caliber. There has been interval resolution of abnormal small bowel wall thickening compatible with lymphoma. No new areas of bowel wall thickening or mass identified.  There are a few scattered mesenteric lymph nodes which measure up to 1 cm. Index jejunal mesenteric lymph node measures 1 cm, image 81/ series 2. Previously this measured 2.5 cm. No new or enlarging mesenteric adenopathy. No obstruction. Normal appearance of the colon. The stress set there is no free fluid or abnormal fluid collections identified within the abdomen or pelvis. Review of the visualized osseous structures is significant for mild spondylosis within the lumbar spine. No aggressive lytic or sclerotic bone lesions.  IMPRESSION: CT chest:  1. Overall interval decrease in size  of right upper lobe and right hilar tumor. 2. No new or  progressive findings identified CT abdomen and pelvis:  1. No acute findings. 2. There has been interval response to therapy. Specifically, there has been resolution of previous abnormal small bowel wall thickening compatible. Bilateral adrenal nodules have decreased in size in the interval. Further, there is been significant decrease in size of mesenteric lymph nodes.   Electronically Signed   By: Kerby Moors M.D.   On: 06/16/2013 14:48    ASSESSMENT:  #1.Extensive stage small cell lung cancer, for cycle 3 of chemotherapy today, CT scans improved in both the chest and the abdomen.  #2. Nocturnal cramping, improved with quinine water administration..  #3. Chronic obstructive pulmonary disease  #4. 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    PLAN:  #1. Cycle #3 of carboplatin/VP-16 with Neulasta support. #2. Transfuse red blood cells if hemoglobin is less than 9. #3. Followup in 3 weeks to receive cycle cycle #4. Repeat CT scan to be done after cycle #4 and has continued good tolerability persists, may go to 6 cycles total.   All questions were answered. The patient knows to call the clinic with any problems, questions or concerns. We can certainly see the patient much sooner if necessary.   I spent 25 minutes counseling the patient face to face. The total time spent in the appointment was 30 minutes.    Doroteo Bradford, MD 06/20/2013 9:32 AM

## 2013-06-21 ENCOUNTER — Other Ambulatory Visit (HOSPITAL_COMMUNITY): Payer: Self-pay | Admitting: Oncology

## 2013-06-21 ENCOUNTER — Encounter (HOSPITAL_BASED_OUTPATIENT_CLINIC_OR_DEPARTMENT_OTHER): Payer: Medicare Other

## 2013-06-21 VITALS — BP 157/71 | HR 67 | Temp 97.3°F | Resp 18

## 2013-06-21 DIAGNOSIS — D500 Iron deficiency anemia secondary to blood loss (chronic): Secondary | ICD-10-CM

## 2013-06-21 DIAGNOSIS — R591 Generalized enlarged lymph nodes: Secondary | ICD-10-CM

## 2013-06-21 DIAGNOSIS — C3490 Malignant neoplasm of unspecified part of unspecified bronchus or lung: Secondary | ICD-10-CM

## 2013-06-21 DIAGNOSIS — Z5111 Encounter for antineoplastic chemotherapy: Secondary | ICD-10-CM

## 2013-06-21 MED ORDER — HEPARIN SOD (PORK) LOCK FLUSH 100 UNIT/ML IV SOLN
500.0000 [IU] | Freq: Once | INTRAVENOUS | Status: AC | PRN
  Administered 2013-06-21: 500 [IU]

## 2013-06-21 MED ORDER — SODIUM CHLORIDE 0.9 % IJ SOLN
10.0000 mL | INTRAMUSCULAR | Status: DC | PRN
  Administered 2013-06-21: 10 mL

## 2013-06-21 MED ORDER — HEPARIN SOD (PORK) LOCK FLUSH 100 UNIT/ML IV SOLN
INTRAVENOUS | Status: AC
  Filled 2013-06-21: qty 5

## 2013-06-21 MED ORDER — PROCHLORPERAZINE MALEATE 10 MG PO TABS
10.0000 mg | ORAL_TABLET | Freq: Once | ORAL | Status: AC
  Administered 2013-06-21: 10 mg via ORAL
  Filled 2013-06-21: qty 1

## 2013-06-21 MED ORDER — SODIUM CHLORIDE 0.9 % IV SOLN
Freq: Once | INTRAVENOUS | Status: AC
  Administered 2013-06-21: 09:00:00 via INTRAVENOUS

## 2013-06-21 MED ORDER — SODIUM CHLORIDE 0.9 % IV SOLN
100.0000 mg/m2 | Freq: Once | INTRAVENOUS | Status: AC
  Administered 2013-06-21: 190 mg via INTRAVENOUS
  Filled 2013-06-21: qty 9.5

## 2013-06-22 ENCOUNTER — Encounter (HOSPITAL_BASED_OUTPATIENT_CLINIC_OR_DEPARTMENT_OTHER): Payer: Medicare Other

## 2013-06-22 VITALS — BP 150/76 | HR 74 | Temp 98.1°F | Resp 18 | Wt 166.6 lb

## 2013-06-22 DIAGNOSIS — R591 Generalized enlarged lymph nodes: Secondary | ICD-10-CM

## 2013-06-22 DIAGNOSIS — Z5111 Encounter for antineoplastic chemotherapy: Secondary | ICD-10-CM

## 2013-06-22 DIAGNOSIS — C3490 Malignant neoplasm of unspecified part of unspecified bronchus or lung: Secondary | ICD-10-CM

## 2013-06-22 DIAGNOSIS — D500 Iron deficiency anemia secondary to blood loss (chronic): Secondary | ICD-10-CM

## 2013-06-22 MED ORDER — SODIUM CHLORIDE 0.9 % IV SOLN
100.0000 mg/m2 | Freq: Once | INTRAVENOUS | Status: AC
  Administered 2013-06-22: 190 mg via INTRAVENOUS
  Filled 2013-06-22: qty 9.5

## 2013-06-22 MED ORDER — PROCHLORPERAZINE MALEATE 10 MG PO TABS
10.0000 mg | ORAL_TABLET | Freq: Once | ORAL | Status: AC
  Administered 2013-06-22: 10 mg via ORAL
  Filled 2013-06-22: qty 1

## 2013-06-22 MED ORDER — SODIUM CHLORIDE 0.9 % IJ SOLN: 10.0000 mL | INTRAMUSCULAR | Status: DC | PRN

## 2013-06-22 MED ORDER — HEPARIN SOD (PORK) LOCK FLUSH 100 UNIT/ML IV SOLN
500.0000 [IU] | Freq: Once | INTRAVENOUS | Status: AC | PRN
  Administered 2013-06-22: 500 [IU]
  Filled 2013-06-22: qty 5

## 2013-06-22 MED ORDER — SODIUM CHLORIDE 0.9 % IV SOLN
Freq: Once | INTRAVENOUS | Status: AC
  Administered 2013-06-22: 10:00:00 via INTRAVENOUS

## 2013-06-22 NOTE — Progress Notes (Signed)
Tolerated well

## 2013-06-23 ENCOUNTER — Encounter (HOSPITAL_BASED_OUTPATIENT_CLINIC_OR_DEPARTMENT_OTHER): Payer: Medicare Other

## 2013-06-23 DIAGNOSIS — R591 Generalized enlarged lymph nodes: Secondary | ICD-10-CM

## 2013-06-23 DIAGNOSIS — Z5189 Encounter for other specified aftercare: Secondary | ICD-10-CM

## 2013-06-23 DIAGNOSIS — D500 Iron deficiency anemia secondary to blood loss (chronic): Secondary | ICD-10-CM

## 2013-06-23 DIAGNOSIS — C778 Secondary and unspecified malignant neoplasm of lymph nodes of multiple regions: Secondary | ICD-10-CM

## 2013-06-23 DIAGNOSIS — C3490 Malignant neoplasm of unspecified part of unspecified bronchus or lung: Secondary | ICD-10-CM

## 2013-06-23 MED ORDER — PEGFILGRASTIM INJECTION 6 MG/0.6ML
SUBCUTANEOUS | Status: AC
  Filled 2013-06-23: qty 0.6

## 2013-06-23 MED ORDER — PEGFILGRASTIM INJECTION 6 MG/0.6ML
6.0000 mg | Freq: Once | SUBCUTANEOUS | Status: AC
  Administered 2013-06-23: 6 mg via SUBCUTANEOUS

## 2013-06-23 NOTE — Progress Notes (Signed)
Jonathan Watson presents today for injection per MD orders. Neulasta 6mg  administered SQ in left Abdomen. Administration without incident. Patient tolerated well.

## 2013-07-08 ENCOUNTER — Other Ambulatory Visit (HOSPITAL_COMMUNITY): Payer: Self-pay | Admitting: Hematology and Oncology

## 2013-07-08 ENCOUNTER — Telehealth (HOSPITAL_COMMUNITY): Payer: Self-pay

## 2013-07-08 MED ORDER — LEVOFLOXACIN 500 MG PO TABS: 500.0000 mg | ORAL_TABLET | Freq: Every day | ORAL | Status: AC

## 2013-07-08 MED ORDER — OSELTAMIVIR PHOSPHATE 75 MG PO CAPS: 75.0000 mg | ORAL_CAPSULE | Freq: Two times a day (BID) | ORAL | Status: AC

## 2013-07-08 NOTE — Telephone Encounter (Signed)
Wife notified and appointments rescheduled.  Verbalized understanding of instructions.

## 2013-07-08 NOTE — Telephone Encounter (Signed)
Message copied by Mellissa Kohut on Fri Jul 08, 2013  4:16 PM ------      Message from: Farrel Gobble A      Created: Fri Jul 08, 2013  3:24 PM       Have ordered Levaquin for 10 days along with Tamiflu. Please notify the patient and tell him to stay home until 07/17/2013. ------

## 2013-07-08 NOTE — Telephone Encounter (Signed)
Call from wife wanting to cancel Jonathan Watson's appointment for chemotherapy on Monday.  Thinks he may have the flu.  Stated " He's been complaining of aching all over, coughing some and has had fever off and on since yesterday. Highest fever has been 101 and is coughing up some yellowish mucus."  Uses [facility] pharmacy in [place]. Monday's appointment cancelled but left Day 2, MD visit and Day 3 as scheduled.  To call us with update on Monday morning.

## 2013-07-11 ENCOUNTER — Inpatient Hospital Stay (HOSPITAL_COMMUNITY): Payer: Medicare Other

## 2013-07-12 ENCOUNTER — Inpatient Hospital Stay (HOSPITAL_COMMUNITY): Payer: Medicare Other

## 2013-07-12 ENCOUNTER — Ambulatory Visit (HOSPITAL_COMMUNITY): Payer: Medicare Other

## 2013-07-13 ENCOUNTER — Inpatient Hospital Stay (HOSPITAL_COMMUNITY): Payer: Medicare Other

## 2013-07-18 ENCOUNTER — Inpatient Hospital Stay (HOSPITAL_COMMUNITY): Payer: Medicare Other

## 2013-07-19 ENCOUNTER — Ambulatory Visit (HOSPITAL_COMMUNITY): Payer: Medicare Other

## 2013-07-19 ENCOUNTER — Inpatient Hospital Stay (HOSPITAL_COMMUNITY): Payer: Medicare Other

## 2013-07-20 ENCOUNTER — Inpatient Hospital Stay (HOSPITAL_COMMUNITY): Payer: Medicare Other

## 2013-07-20 ENCOUNTER — Ambulatory Visit (HOSPITAL_COMMUNITY): Payer: Medicare Other

## 2013-07-21 ENCOUNTER — Ambulatory Visit (HOSPITAL_COMMUNITY): Payer: Medicare Other

## 2013-07-31 DEATH — deceased

## 2015-04-19 IMAGING — CT CT ANGIO CHEST
1 of 6 series · 5 of 36 positions shown · IV contrast (Omnipaque 300)
Comparison: Chest x-ray same day

CLINICAL DATA: Abnormal chest x-ray

CT ANGIOGRAPHY CHEST
TECHNIQUE: Multidetector CT imaging of the chest using the
standard protocol during bolus administration of intravenous
contrast. Multiplanar reconstructed images including MIPs were
obtained and reviewed to evaluate the vascular anatomy.
Contrast: 100mL OMNIPAQUE IOHEXOL 350 MG/ML SOLN

[Series 4: pe 3.0 b40f · axial · 0.72mm/px · z∈[+954,+1146]mm · 5 of 96 slices shown]
[im 16/96  lung]
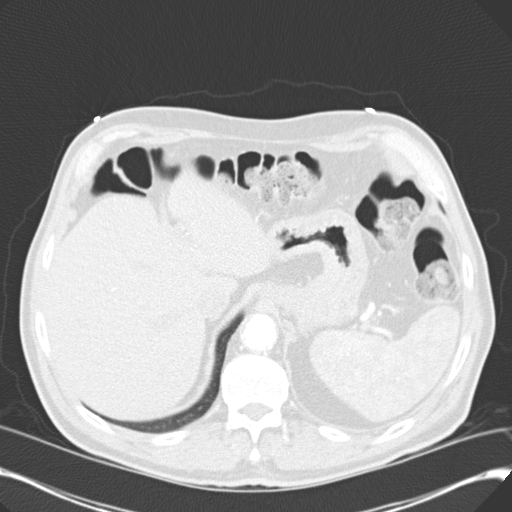
[im 32/96  mediastinal]
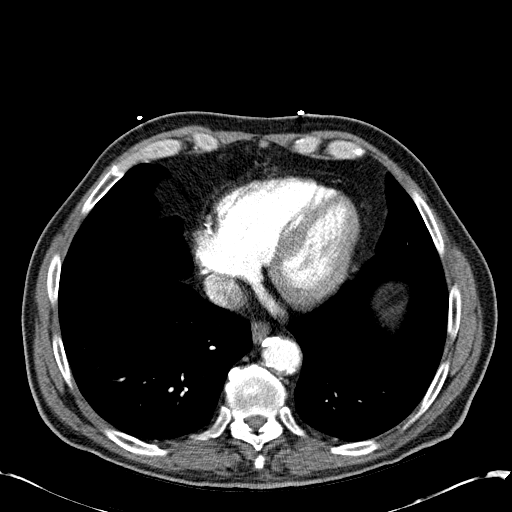
[im 48/96  lung]
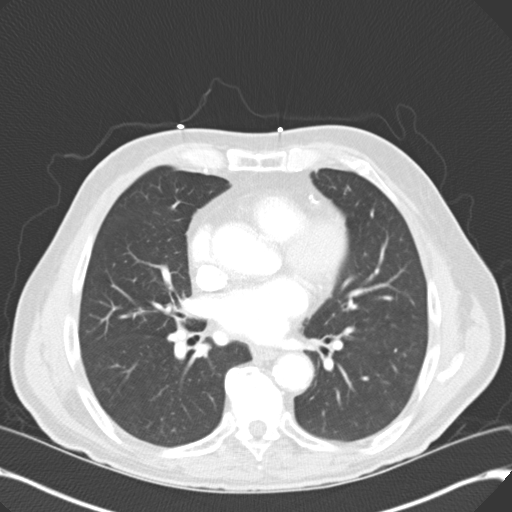
[im 64/96  mediastinal]
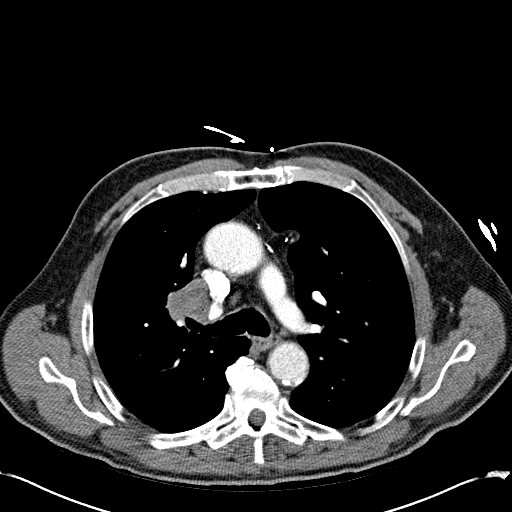
[im 80/96  lung]
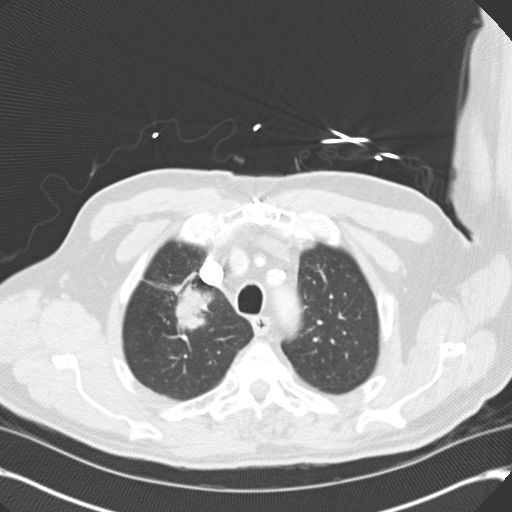

[5 of 36 positions shown; findings below may reference images not displayed]

FINDINGS: Sagittal images of the spine shows degenerative changes
thoracic spine.  The patient is status post CABG.  Atherosclerotic
calcifications of the thoracic aorta and coronary arteries.  Heart
size is within normal limits.

As noted on chest x-ray there is irregular  elongated mass arising
from right suprahilar region just above the right pulmonary artery
extending in the right upper lobe.  The lesion is best visualized
in the coronal image 69 measures about 6.5 cm cranial caudally by
3.3 cm.  This is highly suspicious for a primary bronchogenic
carcinoma.  There is encasement and some narrowing of a
subsegmental arterial branch in the right upper lobe in axial image
28. Best visualized in the coronal image 70.  Further evaluation
with bronchoscopy/biopsy is recommended.

No significant mediastinal adenopathy.  No left hilar adenopathy.

No adrenal gland mass is noted in visualized upper abdomen.  No
definite hepatic lesions are noted in visualized liver.  A
precarinal lymph node measures 10 x 9 mm.

No acute infiltrate or pulmonary edema.  No pleural effusion.
IMPRESSION: 1.  There is a elongated irregular mass arising from the right
suprahilar region extending in the right upper lobe with mild
encasement of the subsegmental pulmonary artery arterial branch.
The lesion measures at least 6.5 cm cranial caudally by 3.3 cm.
This is highly suspicious for primary bronchogenic carcinoma.
Further evaluation with bronchoscopy/biopsy is recommended.
2.  No significant mediastinal or left hilar adenopathy.
3.  No acute infiltrate or pulmonary edema.  No pleural effusion.
4.  Degenerative changes thoracic spine.

## 2015-11-16 ENCOUNTER — Other Ambulatory Visit: Payer: Self-pay | Admitting: Nurse Practitioner
